# Patient Record
Sex: Female | Born: 1970 | Race: Black or African American | Hispanic: No | Marital: Married | Smoking: Never smoker
Health system: Southern US, Community
[De-identification: ages and names within clinical notes are randomized; demographics above are authoritative.]

## PROBLEM LIST (undated history)

## (undated) ENCOUNTER — Inpatient Hospital Stay (HOSPITAL_COMMUNITY): Payer: Self-pay

## (undated) DIAGNOSIS — D649 Anemia, unspecified: Secondary | ICD-10-CM

## (undated) DIAGNOSIS — I10 Essential (primary) hypertension: Secondary | ICD-10-CM

## (undated) DIAGNOSIS — E119 Type 2 diabetes mellitus without complications: Secondary | ICD-10-CM

## (undated) HISTORY — DX: Type 2 diabetes mellitus without complications: E11.9

## (undated) HISTORY — PX: BREAST BIOPSY: SHX20

## (undated) HISTORY — PX: DENTAL SURGERY: SHX609

---

## 2000-04-26 ENCOUNTER — Inpatient Hospital Stay (HOSPITAL_COMMUNITY): Admission: AD | Admit: 2000-04-26 | Discharge: 2000-04-26 | Payer: Self-pay | Admitting: *Deleted

## 2000-05-09 ENCOUNTER — Encounter: Admission: RE | Admit: 2000-05-09 | Discharge: 2000-05-09 | Payer: Self-pay | Admitting: Obstetrics

## 2000-05-10 ENCOUNTER — Ambulatory Visit (HOSPITAL_COMMUNITY): Admission: RE | Admit: 2000-05-10 | Discharge: 2000-05-10 | Payer: Self-pay | Admitting: Obstetrics

## 2000-06-06 ENCOUNTER — Encounter: Admission: RE | Admit: 2000-06-06 | Discharge: 2000-06-06 | Payer: Self-pay | Admitting: Obstetrics

## 2000-06-19 ENCOUNTER — Ambulatory Visit (HOSPITAL_COMMUNITY): Admission: RE | Admit: 2000-06-19 | Discharge: 2000-06-19 | Payer: Self-pay | Admitting: Obstetrics

## 2000-06-20 ENCOUNTER — Encounter: Admission: RE | Admit: 2000-06-20 | Discharge: 2000-06-20 | Payer: Self-pay | Admitting: Obstetrics

## 2000-06-24 ENCOUNTER — Inpatient Hospital Stay (HOSPITAL_COMMUNITY): Admission: AD | Admit: 2000-06-24 | Discharge: 2000-06-24 | Payer: Self-pay | Admitting: Obstetrics

## 2000-07-04 ENCOUNTER — Encounter: Admission: RE | Admit: 2000-07-04 | Discharge: 2000-07-04 | Payer: Self-pay | Admitting: Obstetrics

## 2000-07-17 ENCOUNTER — Encounter: Admission: RE | Admit: 2000-07-17 | Discharge: 2000-07-17 | Payer: Self-pay | Admitting: Obstetrics & Gynecology

## 2000-07-22 ENCOUNTER — Inpatient Hospital Stay (HOSPITAL_COMMUNITY): Admission: AD | Admit: 2000-07-22 | Discharge: 2000-07-22 | Payer: Self-pay | Admitting: Obstetrics

## 2000-07-31 ENCOUNTER — Encounter: Admission: RE | Admit: 2000-07-31 | Discharge: 2000-07-31 | Payer: Self-pay | Admitting: Obstetrics & Gynecology

## 2000-08-01 ENCOUNTER — Ambulatory Visit (HOSPITAL_COMMUNITY): Admission: RE | Admit: 2000-08-01 | Discharge: 2000-08-01 | Payer: Self-pay | Admitting: Obstetrics

## 2000-08-09 ENCOUNTER — Observation Stay (HOSPITAL_COMMUNITY): Admission: RE | Admit: 2000-08-09 | Discharge: 2000-08-10 | Payer: Self-pay

## 2000-08-14 ENCOUNTER — Encounter (HOSPITAL_COMMUNITY): Admission: RE | Admit: 2000-08-14 | Discharge: 2000-11-12 | Payer: Self-pay | Admitting: Obstetrics & Gynecology

## 2000-08-14 ENCOUNTER — Encounter: Admission: RE | Admit: 2000-08-14 | Discharge: 2000-08-14 | Payer: Self-pay | Admitting: Obstetrics & Gynecology

## 2000-08-16 ENCOUNTER — Observation Stay (HOSPITAL_COMMUNITY): Admission: AD | Admit: 2000-08-16 | Discharge: 2000-08-17 | Payer: Self-pay | Admitting: *Deleted

## 2000-08-19 ENCOUNTER — Inpatient Hospital Stay (HOSPITAL_COMMUNITY): Admission: AD | Admit: 2000-08-19 | Discharge: 2000-08-19 | Payer: Self-pay | Admitting: Obstetrics & Gynecology

## 2000-08-21 ENCOUNTER — Inpatient Hospital Stay (HOSPITAL_COMMUNITY): Admission: AD | Admit: 2000-08-21 | Discharge: 2000-08-21 | Payer: Self-pay | Admitting: Obstetrics

## 2000-08-21 ENCOUNTER — Encounter: Admission: RE | Admit: 2000-08-21 | Discharge: 2000-08-21 | Payer: Self-pay | Admitting: Obstetrics & Gynecology

## 2000-08-26 ENCOUNTER — Encounter: Admission: RE | Admit: 2000-08-26 | Discharge: 2000-11-24 | Payer: Self-pay | Admitting: Obstetrics & Gynecology

## 2000-08-28 ENCOUNTER — Encounter: Admission: RE | Admit: 2000-08-28 | Discharge: 2000-08-28 | Payer: Self-pay | Admitting: Obstetrics & Gynecology

## 2000-09-11 ENCOUNTER — Encounter: Admission: RE | Admit: 2000-09-11 | Discharge: 2000-09-11 | Payer: Self-pay | Admitting: Obstetrics & Gynecology

## 2000-09-18 ENCOUNTER — Encounter: Admission: RE | Admit: 2000-09-18 | Discharge: 2000-09-18 | Payer: Self-pay | Admitting: Obstetrics & Gynecology

## 2000-09-25 ENCOUNTER — Encounter: Admission: RE | Admit: 2000-09-25 | Discharge: 2000-09-25 | Payer: Self-pay | Admitting: Obstetrics & Gynecology

## 2000-10-02 ENCOUNTER — Encounter: Admission: RE | Admit: 2000-10-02 | Discharge: 2000-10-02 | Payer: Self-pay | Admitting: Obstetrics & Gynecology

## 2000-10-09 ENCOUNTER — Inpatient Hospital Stay (HOSPITAL_COMMUNITY): Admission: AD | Admit: 2000-10-09 | Discharge: 2000-10-09 | Payer: Self-pay | Admitting: Obstetrics

## 2000-10-09 ENCOUNTER — Encounter: Admission: RE | Admit: 2000-10-09 | Discharge: 2000-10-09 | Payer: Self-pay | Admitting: Obstetrics & Gynecology

## 2000-10-16 ENCOUNTER — Encounter: Admission: RE | Admit: 2000-10-16 | Discharge: 2000-10-16 | Payer: Self-pay | Admitting: Obstetrics & Gynecology

## 2000-10-23 ENCOUNTER — Encounter: Admission: RE | Admit: 2000-10-23 | Discharge: 2000-10-23 | Payer: Self-pay | Admitting: Obstetrics & Gynecology

## 2000-10-30 ENCOUNTER — Encounter: Admission: RE | Admit: 2000-10-30 | Discharge: 2000-10-30 | Payer: Self-pay | Admitting: Obstetrics & Gynecology

## 2000-11-01 ENCOUNTER — Encounter: Payer: Self-pay | Admitting: *Deleted

## 2000-11-06 ENCOUNTER — Encounter: Admission: RE | Admit: 2000-11-06 | Discharge: 2000-11-06 | Payer: Self-pay | Admitting: Obstetrics & Gynecology

## 2000-11-13 ENCOUNTER — Encounter (INDEPENDENT_AMBULATORY_CARE_PROVIDER_SITE_OTHER): Payer: Self-pay | Admitting: Specialist

## 2000-11-13 ENCOUNTER — Inpatient Hospital Stay (HOSPITAL_COMMUNITY): Admission: AD | Admit: 2000-11-13 | Discharge: 2000-11-15 | Payer: Self-pay | Admitting: Obstetrics

## 2001-01-14 ENCOUNTER — Inpatient Hospital Stay (HOSPITAL_COMMUNITY): Admission: AD | Admit: 2001-01-14 | Discharge: 2001-01-14 | Payer: Self-pay | Admitting: *Deleted

## 2001-01-16 ENCOUNTER — Encounter: Admission: RE | Admit: 2001-01-16 | Discharge: 2001-01-16 | Payer: Self-pay | Admitting: Internal Medicine

## 2001-11-03 ENCOUNTER — Other Ambulatory Visit: Admission: RE | Admit: 2001-11-03 | Discharge: 2001-11-03 | Payer: Self-pay | Admitting: Family Medicine

## 2001-11-11 ENCOUNTER — Encounter: Admission: RE | Admit: 2001-11-11 | Discharge: 2001-11-11 | Payer: Self-pay | Admitting: Family Medicine

## 2001-11-21 ENCOUNTER — Ambulatory Visit (HOSPITAL_BASED_OUTPATIENT_CLINIC_OR_DEPARTMENT_OTHER): Admission: RE | Admit: 2001-11-21 | Discharge: 2001-11-21 | Payer: Self-pay | Admitting: Otolaryngology

## 2001-11-21 ENCOUNTER — Encounter (INDEPENDENT_AMBULATORY_CARE_PROVIDER_SITE_OTHER): Payer: Self-pay | Admitting: *Deleted

## 2002-03-20 ENCOUNTER — Emergency Department (HOSPITAL_COMMUNITY): Admission: EM | Admit: 2002-03-20 | Discharge: 2002-03-20 | Payer: Self-pay | Admitting: Emergency Medicine

## 2002-03-20 ENCOUNTER — Encounter: Payer: Self-pay | Admitting: Emergency Medicine

## 2002-08-10 ENCOUNTER — Other Ambulatory Visit: Admission: RE | Admit: 2002-08-10 | Discharge: 2002-08-10 | Payer: Self-pay | Admitting: Family Medicine

## 2004-03-17 ENCOUNTER — Encounter: Admission: RE | Admit: 2004-03-17 | Discharge: 2004-03-17 | Payer: Self-pay | Admitting: Family Medicine

## 2004-03-23 ENCOUNTER — Ambulatory Visit (HOSPITAL_COMMUNITY): Admission: RE | Admit: 2004-03-23 | Discharge: 2004-03-23 | Payer: Self-pay | Admitting: Family Medicine

## 2005-06-27 ENCOUNTER — Ambulatory Visit: Payer: Self-pay | Admitting: *Deleted

## 2005-07-02 ENCOUNTER — Ambulatory Visit: Payer: Self-pay | Admitting: Obstetrics and Gynecology

## 2005-07-18 ENCOUNTER — Ambulatory Visit: Payer: Self-pay | Admitting: Obstetrics & Gynecology

## 2005-07-18 ENCOUNTER — Ambulatory Visit (HOSPITAL_COMMUNITY): Admission: RE | Admit: 2005-07-18 | Discharge: 2005-07-18 | Payer: Self-pay | Admitting: Obstetrics and Gynecology

## 2005-08-16 ENCOUNTER — Ambulatory Visit: Payer: Self-pay | Admitting: *Deleted

## 2005-08-30 ENCOUNTER — Ambulatory Visit (HOSPITAL_COMMUNITY): Admission: RE | Admit: 2005-08-30 | Discharge: 2005-08-30 | Payer: Self-pay | Admitting: Obstetrics and Gynecology

## 2005-09-05 ENCOUNTER — Ambulatory Visit: Payer: Self-pay | Admitting: Obstetrics & Gynecology

## 2005-09-19 ENCOUNTER — Ambulatory Visit: Payer: Self-pay | Admitting: *Deleted

## 2005-09-26 ENCOUNTER — Ambulatory Visit: Payer: Self-pay | Admitting: *Deleted

## 2005-10-03 ENCOUNTER — Ambulatory Visit: Payer: Self-pay | Admitting: Obstetrics & Gynecology

## 2005-10-17 ENCOUNTER — Ambulatory Visit (HOSPITAL_COMMUNITY): Admission: RE | Admit: 2005-10-17 | Discharge: 2005-10-17 | Payer: Self-pay | Admitting: Obstetrics & Gynecology

## 2005-10-17 ENCOUNTER — Ambulatory Visit: Payer: Self-pay | Admitting: Obstetrics & Gynecology

## 2005-10-25 ENCOUNTER — Ambulatory Visit: Payer: Self-pay | Admitting: Family Medicine

## 2005-10-31 ENCOUNTER — Ambulatory Visit: Payer: Self-pay | Admitting: *Deleted

## 2005-11-07 ENCOUNTER — Ambulatory Visit: Payer: Self-pay | Admitting: Obstetrics & Gynecology

## 2005-11-07 ENCOUNTER — Ambulatory Visit (HOSPITAL_COMMUNITY): Admission: RE | Admit: 2005-11-07 | Discharge: 2005-11-07 | Payer: Self-pay | Admitting: *Deleted

## 2005-11-15 ENCOUNTER — Ambulatory Visit: Payer: Self-pay | Admitting: Family Medicine

## 2005-11-28 ENCOUNTER — Ambulatory Visit: Payer: Self-pay | Admitting: Obstetrics & Gynecology

## 2005-12-04 ENCOUNTER — Ambulatory Visit (HOSPITAL_COMMUNITY): Admission: RE | Admit: 2005-12-04 | Discharge: 2005-12-04 | Payer: Self-pay | Admitting: *Deleted

## 2005-12-05 ENCOUNTER — Ambulatory Visit: Payer: Self-pay | Admitting: *Deleted

## 2005-12-12 ENCOUNTER — Ambulatory Visit: Payer: Self-pay | Admitting: Family Medicine

## 2005-12-14 ENCOUNTER — Ambulatory Visit: Payer: Self-pay | Admitting: *Deleted

## 2005-12-19 ENCOUNTER — Ambulatory Visit: Payer: Self-pay | Admitting: *Deleted

## 2005-12-19 ENCOUNTER — Ambulatory Visit: Payer: Self-pay | Admitting: Obstetrics & Gynecology

## 2005-12-21 ENCOUNTER — Ambulatory Visit: Payer: Self-pay | Admitting: *Deleted

## 2005-12-26 ENCOUNTER — Ambulatory Visit: Payer: Self-pay | Admitting: *Deleted

## 2005-12-28 ENCOUNTER — Ambulatory Visit: Payer: Self-pay | Admitting: *Deleted

## 2006-01-02 ENCOUNTER — Ambulatory Visit: Payer: Self-pay | Admitting: Obstetrics & Gynecology

## 2006-01-04 ENCOUNTER — Ambulatory Visit: Payer: Self-pay | Admitting: *Deleted

## 2006-01-09 ENCOUNTER — Ambulatory Visit: Payer: Self-pay | Admitting: Family Medicine

## 2006-01-11 ENCOUNTER — Ambulatory Visit: Payer: Self-pay | Admitting: *Deleted

## 2006-01-15 ENCOUNTER — Ambulatory Visit (HOSPITAL_COMMUNITY): Admission: RE | Admit: 2006-01-15 | Discharge: 2006-01-15 | Payer: Self-pay | Admitting: *Deleted

## 2006-01-16 ENCOUNTER — Inpatient Hospital Stay (HOSPITAL_COMMUNITY): Admission: AD | Admit: 2006-01-16 | Discharge: 2006-01-19 | Payer: Self-pay | Admitting: Obstetrics and Gynecology

## 2006-01-16 ENCOUNTER — Encounter (INDEPENDENT_AMBULATORY_CARE_PROVIDER_SITE_OTHER): Payer: Self-pay | Admitting: *Deleted

## 2006-01-16 ENCOUNTER — Ambulatory Visit: Payer: Self-pay | Admitting: Obstetrics and Gynecology

## 2006-02-02 ENCOUNTER — Inpatient Hospital Stay (HOSPITAL_COMMUNITY): Admission: AD | Admit: 2006-02-02 | Discharge: 2006-02-02 | Payer: Self-pay | Admitting: Obstetrics and Gynecology

## 2008-01-14 ENCOUNTER — Ambulatory Visit: Payer: Self-pay | Admitting: Internal Medicine

## 2008-01-14 ENCOUNTER — Ambulatory Visit: Payer: Self-pay | Admitting: *Deleted

## 2008-01-14 LAB — CONVERTED CEMR LAB
ALT: 29 units/L (ref 0–35)
AST: 23 units/L (ref 0–37)
Albumin: 4.6 g/dL (ref 3.5–5.2)
Alkaline Phosphatase: 69 units/L (ref 39–117)
BUN: 6 mg/dL (ref 6–23)
Basophils Absolute: 0 10*3/uL (ref 0.0–0.1)
Basophils Relative: 0 % (ref 0–1)
CO2: 24 meq/L (ref 19–32)
Calcium: 9 mg/dL (ref 8.4–10.5)
Chloride: 102 meq/L (ref 96–112)
Cholesterol: 140 mg/dL (ref 0–200)
Creatinine, Ser: 0.6 mg/dL (ref 0.40–1.20)
Eosinophils Absolute: 0.1 10*3/uL (ref 0.0–0.7)
Eosinophils Relative: 1 % (ref 0–5)
Glucose, Bld: 126 mg/dL — ABNORMAL HIGH (ref 70–99)
HCT: 40.8 % (ref 36.0–46.0)
HDL: 39 mg/dL — ABNORMAL LOW (ref >39)
Hemoglobin: 12.7 g/dL (ref 12.0–15.0)
LDL Cholesterol: 74 mg/dL (ref 0–99)
Lymphocytes Relative: 22 % (ref 12–46)
Lymphs Abs: 2.2 10*3/uL (ref 0.7–4.0)
MCHC: 31.1 g/dL (ref 30.0–36.0)
MCV: 80.3 fL (ref 78.0–100.0)
Monocytes Absolute: 0.7 10*3/uL (ref 0.1–1.0)
Monocytes Relative: 7 % (ref 3–12)
Neutro Abs: 6.8 10*3/uL (ref 1.7–7.7)
Neutrophils Relative %: 69 % (ref 43–77)
Platelets: 386 10*3/uL (ref 150–400)
Potassium: 3.6 meq/L (ref 3.5–5.3)
RBC: 5.08 M/uL (ref 3.87–5.11)
RDW: 13.7 % (ref 11.5–15.5)
Sodium: 139 meq/L (ref 135–145)
TSH: 0.782 microintl units/mL (ref 0.350–5.50)
Testosterone: 72.42 ng/dL — ABNORMAL HIGH (ref 10–70)
Total Bilirubin: 0.4 mg/dL (ref 0.3–1.2)
Total CHOL/HDL Ratio: 3.6
Total Protein: 7.7 g/dL (ref 6.0–8.3)
Triglycerides: 135 mg/dL (ref <150)
VLDL: 27 mg/dL (ref 0–40)
WBC: 9.8 10*3/uL (ref 4.0–10.5)

## 2008-01-22 ENCOUNTER — Ambulatory Visit (HOSPITAL_COMMUNITY): Admission: RE | Admit: 2008-01-22 | Discharge: 2008-01-22 | Payer: Self-pay | Admitting: Family Medicine

## 2008-03-02 ENCOUNTER — Encounter: Payer: Self-pay | Admitting: Family Medicine

## 2008-03-02 ENCOUNTER — Ambulatory Visit: Payer: Self-pay | Admitting: Family Medicine

## 2009-03-06 ENCOUNTER — Inpatient Hospital Stay (HOSPITAL_COMMUNITY): Admission: AD | Admit: 2009-03-06 | Discharge: 2009-03-06 | Payer: Self-pay | Admitting: Obstetrics & Gynecology

## 2010-01-19 ENCOUNTER — Inpatient Hospital Stay (HOSPITAL_COMMUNITY): Admission: AD | Admit: 2010-01-19 | Discharge: 2010-01-19 | Payer: Self-pay | Admitting: Family Medicine

## 2010-01-23 ENCOUNTER — Ambulatory Visit: Payer: Self-pay | Admitting: Obstetrics & Gynecology

## 2010-01-23 ENCOUNTER — Encounter: Payer: Self-pay | Admitting: Obstetrics & Gynecology

## 2010-01-27 ENCOUNTER — Ambulatory Visit (HOSPITAL_COMMUNITY): Admission: RE | Admit: 2010-01-27 | Discharge: 2010-01-27 | Payer: Self-pay | Admitting: Obstetrics & Gynecology

## 2010-02-01 ENCOUNTER — Ambulatory Visit (HOSPITAL_COMMUNITY): Admission: RE | Admit: 2010-02-01 | Discharge: 2010-02-01 | Payer: Self-pay | Admitting: Obstetrics & Gynecology

## 2010-02-06 ENCOUNTER — Ambulatory Visit: Payer: Self-pay | Admitting: Obstetrics & Gynecology

## 2010-02-06 ENCOUNTER — Encounter: Admission: RE | Admit: 2010-02-06 | Discharge: 2010-05-07 | Payer: Self-pay | Admitting: Obstetrics & Gynecology

## 2010-02-06 LAB — CONVERTED CEMR LAB
ALT: 9 units/L (ref 0–35)
Alkaline Phosphatase: 53 units/L (ref 39–117)
CO2: 20 meq/L (ref 19–32)
Creatinine, Ser: 0.48 mg/dL (ref 0.40–1.20)
HCT: 35 % — ABNORMAL LOW (ref 36.0–46.0)
MCHC: 32.9 g/dL (ref 30.0–36.0)
MCV: 77.3 fL — ABNORMAL LOW (ref 78.0–100.0)
Platelets: 343 10*3/uL (ref 150–400)
TSH: 0.262 microintl units/mL — ABNORMAL LOW (ref 0.350–4.500)
Total Bilirubin: 0.2 mg/dL — ABNORMAL LOW (ref 0.3–1.2)
Uric Acid, Serum: 3 mg/dL (ref 2.4–7.0)
WBC: 18.4 10*3/uL — ABNORMAL HIGH (ref 4.0–10.5)

## 2010-02-07 ENCOUNTER — Ambulatory Visit: Payer: Self-pay | Admitting: Obstetrics & Gynecology

## 2010-02-08 ENCOUNTER — Encounter: Payer: Self-pay | Admitting: Obstetrics & Gynecology

## 2010-02-08 LAB — CONVERTED CEMR LAB
Creatinine 24 HR UR: 1603 mg/24hr (ref 700–1800)
Creatinine, Urine: 87.8 mg/dL

## 2010-02-13 ENCOUNTER — Ambulatory Visit: Payer: Self-pay | Admitting: Obstetrics and Gynecology

## 2010-02-13 ENCOUNTER — Encounter: Payer: Self-pay | Admitting: Family

## 2010-02-13 LAB — CONVERTED CEMR LAB: Free T4: 1.07 ng/dL (ref 0.80–1.80)

## 2010-03-02 ENCOUNTER — Ambulatory Visit: Payer: Self-pay | Admitting: Obstetrics & Gynecology

## 2010-03-08 ENCOUNTER — Ambulatory Visit (HOSPITAL_COMMUNITY): Admission: RE | Admit: 2010-03-08 | Discharge: 2010-03-08 | Payer: Self-pay | Admitting: Obstetrics & Gynecology

## 2010-03-13 ENCOUNTER — Ambulatory Visit: Payer: Self-pay | Admitting: Obstetrics & Gynecology

## 2010-03-20 ENCOUNTER — Ambulatory Visit: Payer: Self-pay | Admitting: Obstetrics and Gynecology

## 2010-04-03 ENCOUNTER — Ambulatory Visit: Payer: Self-pay | Admitting: Obstetrics & Gynecology

## 2010-04-20 ENCOUNTER — Ambulatory Visit: Payer: Self-pay | Admitting: Obstetrics & Gynecology

## 2010-05-01 ENCOUNTER — Ambulatory Visit: Payer: Self-pay | Admitting: Obstetrics & Gynecology

## 2010-05-05 ENCOUNTER — Ambulatory Visit (HOSPITAL_COMMUNITY): Admission: RE | Admit: 2010-05-05 | Discharge: 2010-05-05 | Payer: Self-pay | Admitting: Obstetrics & Gynecology

## 2010-05-15 ENCOUNTER — Encounter: Admission: RE | Admit: 2010-05-15 | Discharge: 2010-07-10 | Payer: Self-pay | Admitting: Obstetrics & Gynecology

## 2010-05-15 ENCOUNTER — Ambulatory Visit: Payer: Self-pay | Admitting: Obstetrics & Gynecology

## 2010-05-15 ENCOUNTER — Encounter: Payer: Self-pay | Admitting: Family

## 2010-05-15 LAB — CONVERTED CEMR LAB
HCT: 34.1 % — ABNORMAL LOW (ref 36.0–46.0)
Hemoglobin: 10.9 g/dL — ABNORMAL LOW (ref 12.0–15.0)
MCHC: 32 g/dL (ref 30.0–36.0)
RDW: 14.3 % (ref 11.5–15.5)

## 2010-05-22 ENCOUNTER — Ambulatory Visit: Payer: Self-pay | Admitting: Obstetrics & Gynecology

## 2010-05-30 ENCOUNTER — Ambulatory Visit (HOSPITAL_COMMUNITY): Admission: RE | Admit: 2010-05-30 | Discharge: 2010-05-30 | Payer: Self-pay | Admitting: Family Medicine

## 2010-06-05 ENCOUNTER — Ambulatory Visit: Payer: Self-pay | Admitting: Obstetrics & Gynecology

## 2010-06-19 ENCOUNTER — Ambulatory Visit: Payer: Self-pay | Admitting: Obstetrics & Gynecology

## 2010-06-19 ENCOUNTER — Ambulatory Visit (HOSPITAL_COMMUNITY): Admission: RE | Admit: 2010-06-19 | Discharge: 2010-06-19 | Payer: Self-pay | Admitting: Obstetrics & Gynecology

## 2010-06-19 ENCOUNTER — Other Ambulatory Visit: Payer: Self-pay | Admitting: Obstetrics & Gynecology

## 2010-07-03 ENCOUNTER — Ambulatory Visit: Payer: Self-pay | Admitting: Family Medicine

## 2010-07-06 ENCOUNTER — Ambulatory Visit: Payer: Self-pay | Admitting: Obstetrics & Gynecology

## 2010-07-10 ENCOUNTER — Ambulatory Visit: Payer: Self-pay | Admitting: Obstetrics & Gynecology

## 2010-07-10 ENCOUNTER — Encounter: Payer: Self-pay | Admitting: Physician Assistant

## 2010-07-11 ENCOUNTER — Encounter: Payer: Self-pay | Admitting: Physician Assistant

## 2010-07-13 ENCOUNTER — Ambulatory Visit: Payer: Self-pay | Admitting: Obstetrics and Gynecology

## 2010-07-17 ENCOUNTER — Ambulatory Visit (HOSPITAL_COMMUNITY): Admission: RE | Admit: 2010-07-17 | Discharge: 2010-07-17 | Payer: Self-pay | Admitting: Obstetrics & Gynecology

## 2010-07-17 ENCOUNTER — Ambulatory Visit: Payer: Self-pay | Admitting: Obstetrics & Gynecology

## 2010-07-20 ENCOUNTER — Ambulatory Visit: Payer: Self-pay | Admitting: Obstetrics and Gynecology

## 2010-07-24 ENCOUNTER — Ambulatory Visit: Payer: Self-pay | Admitting: Obstetrics & Gynecology

## 2010-07-27 ENCOUNTER — Ambulatory Visit: Payer: Self-pay | Admitting: Obstetrics & Gynecology

## 2010-07-31 ENCOUNTER — Inpatient Hospital Stay (HOSPITAL_COMMUNITY): Admission: RE | Admit: 2010-07-31 | Discharge: 2010-08-04 | Payer: Self-pay | Admitting: Obstetrics & Gynecology

## 2010-07-31 ENCOUNTER — Ambulatory Visit: Payer: Self-pay | Admitting: Obstetrics and Gynecology

## 2010-08-01 ENCOUNTER — Encounter: Payer: Self-pay | Admitting: Obstetrics & Gynecology

## 2010-08-10 ENCOUNTER — Ambulatory Visit: Payer: Self-pay | Admitting: Advanced Practice Midwife

## 2010-08-10 ENCOUNTER — Inpatient Hospital Stay (HOSPITAL_COMMUNITY): Admission: AD | Admit: 2010-08-10 | Discharge: 2010-08-10 | Payer: Self-pay | Admitting: Obstetrics & Gynecology

## 2010-08-27 IMAGING — US US OB FOLLOW-UP
1 series · 17 of 17 positions shown · non-contrast
Comparison: none

OBSTETRICAL ULTRASOUND:
 This ultrasound was performed in The [HOSPITAL], and the [facility] report will be stored to [REDACTED] PACS.  This report is also available in [HOSPITAL]?s accessANYware.

[Series 1: us ob follow-up · 17 acquisitions, 17 frames shown]
[im 1/17]
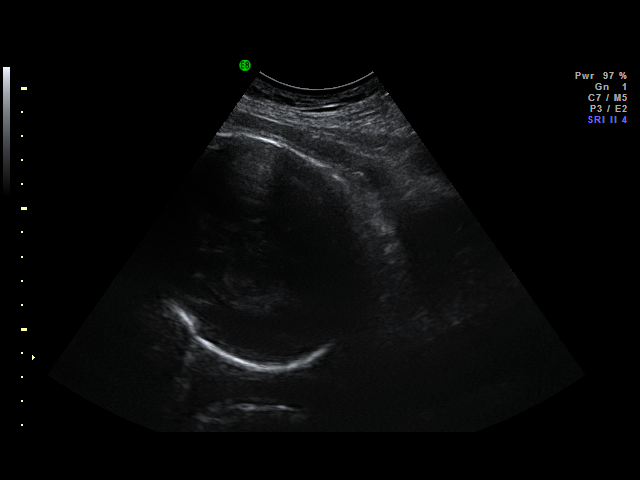
[im 2/17]
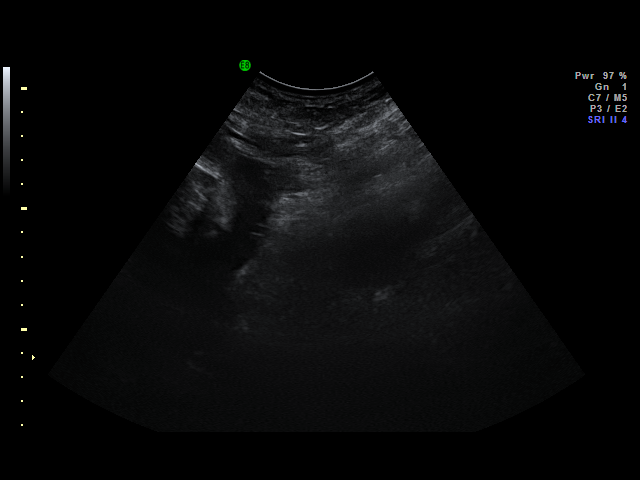
[im 3/17]
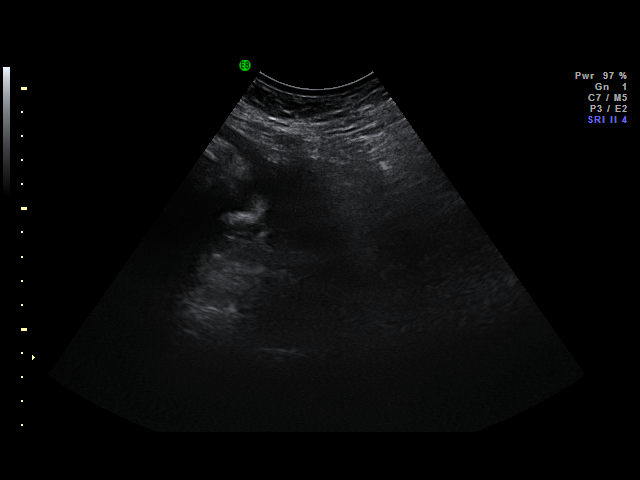
[im 4/17]
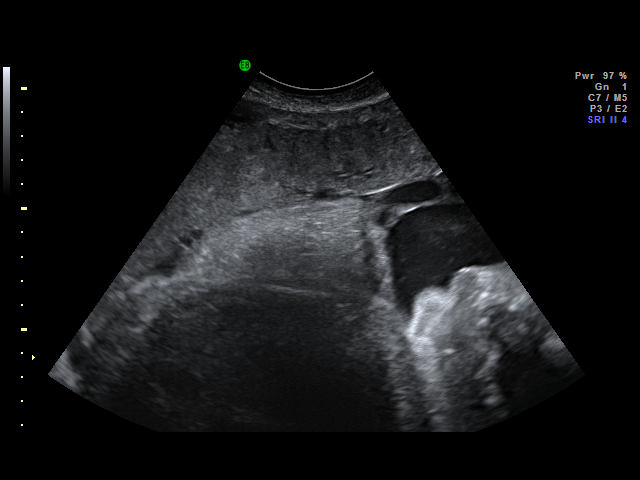
[im 5/17]
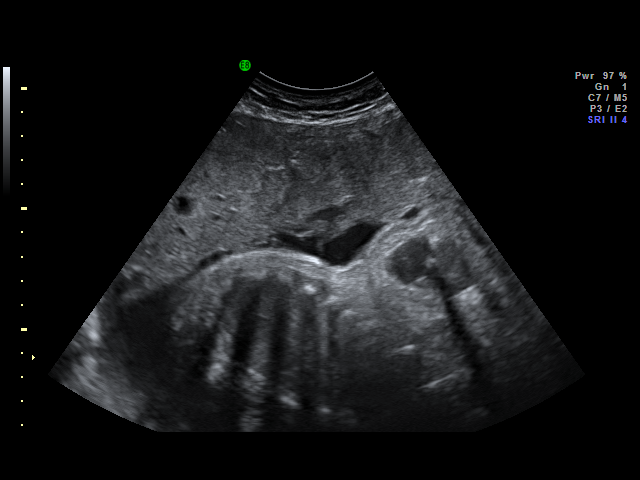
[im 6/17]
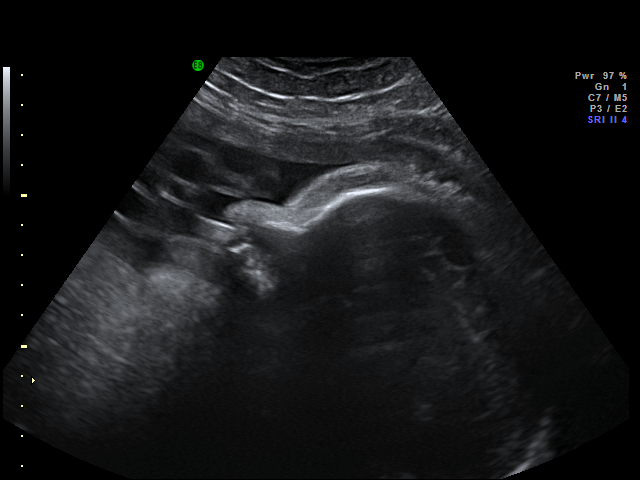
[im 7/17]
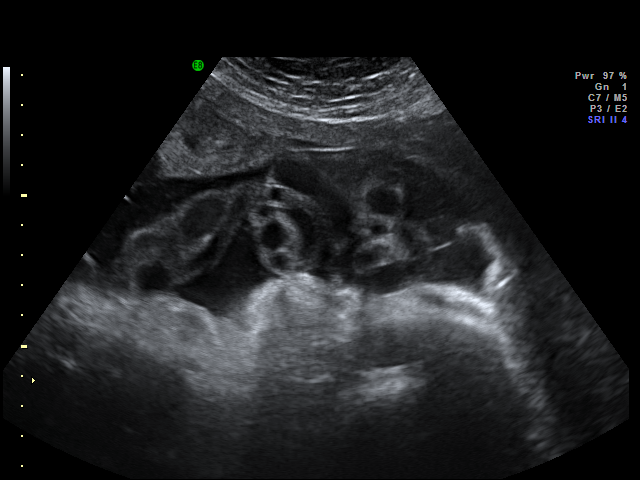
[im 8/17]
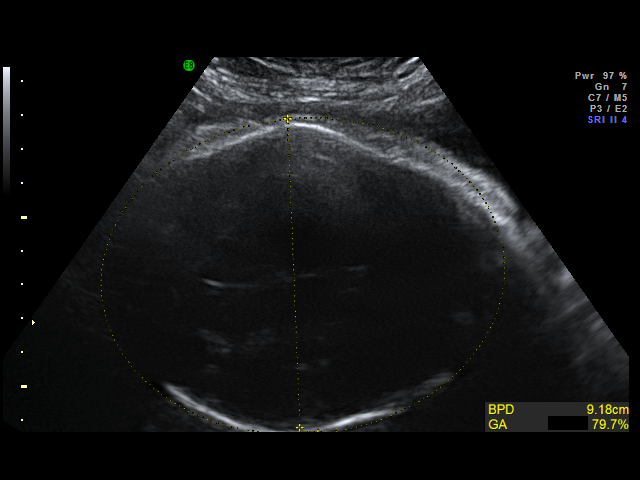
[im 9/17]
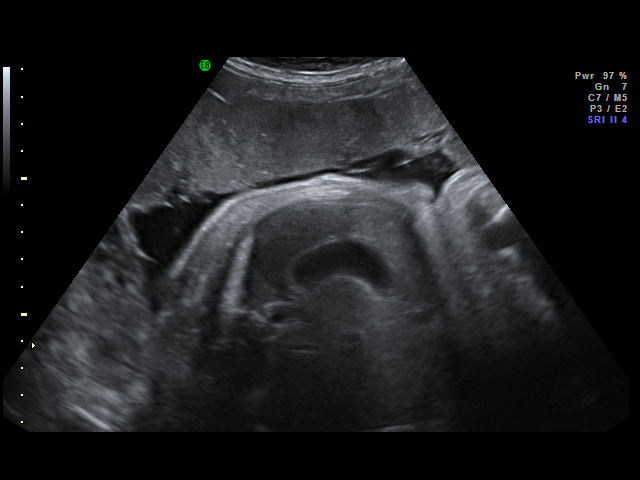
[im 10/17]
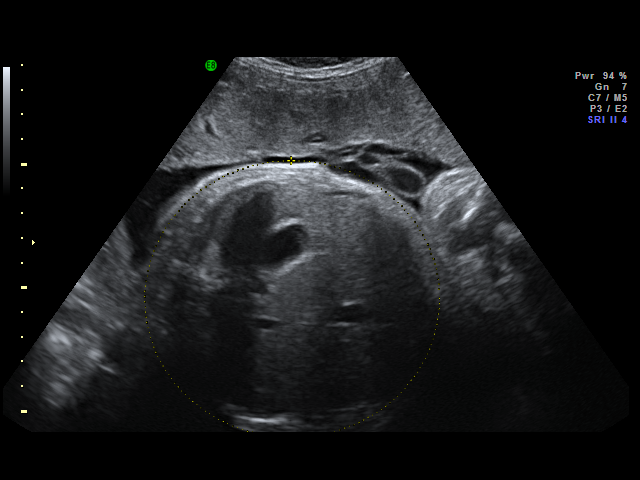
[im 11/17]
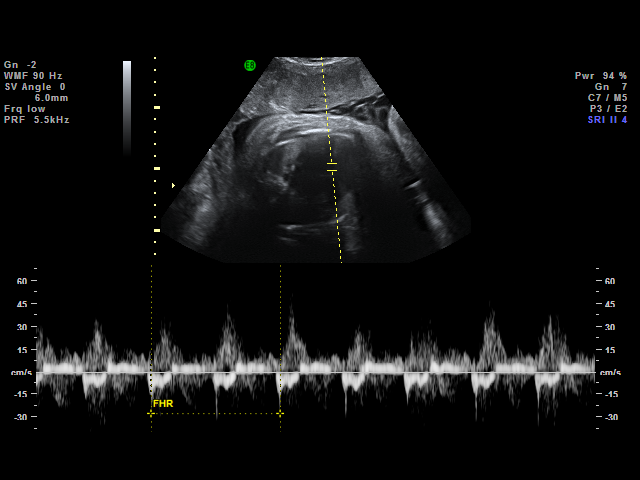
[im 12/17]
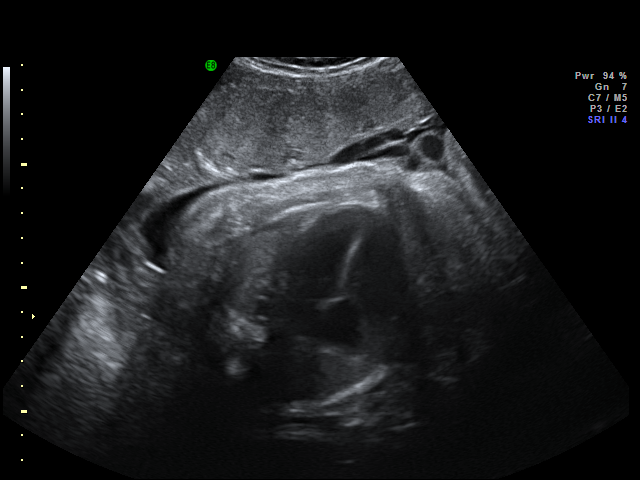
[im 13/17]
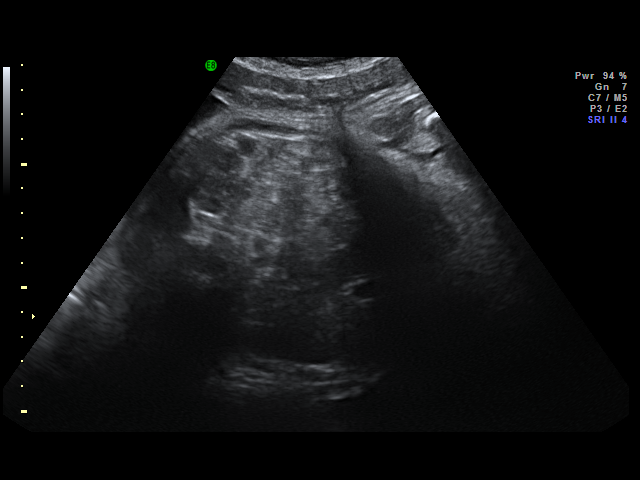
[im 14/17]
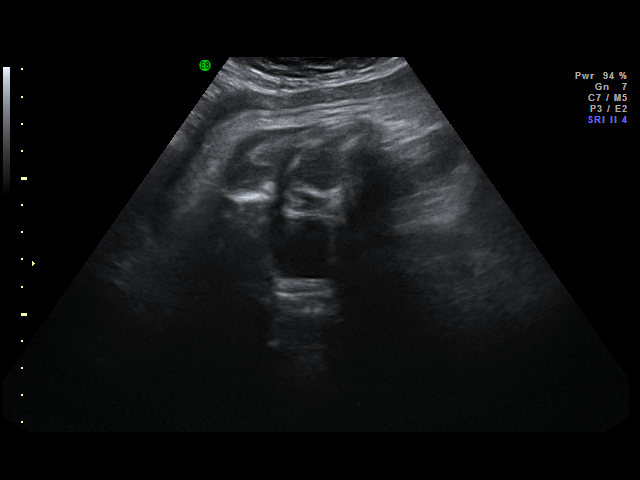
[im 15/17]
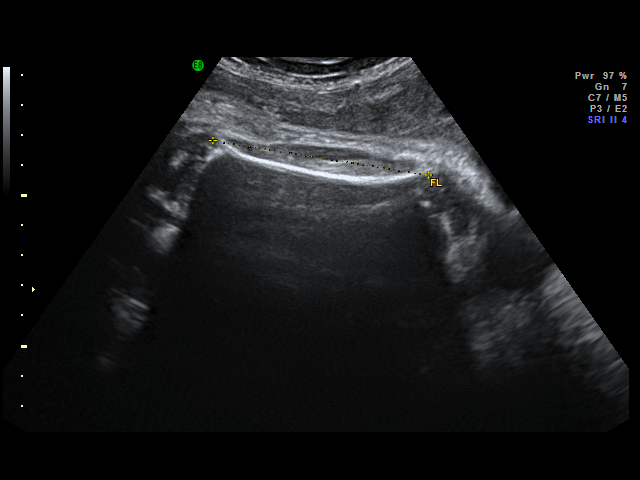
[im 16/17]
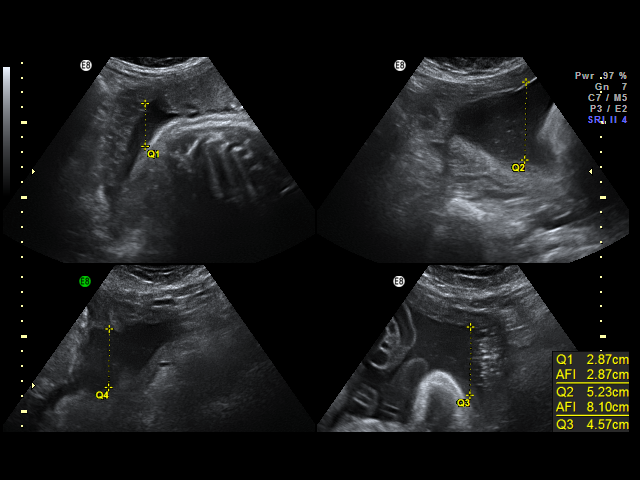
[im 17/17]
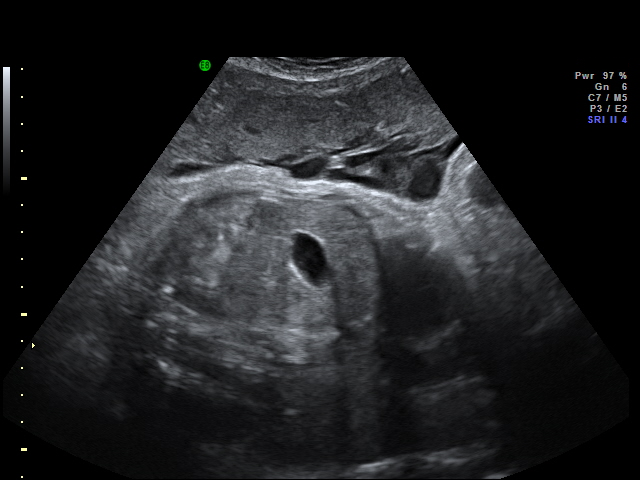

[17 of 17 positions shown; findings below may reference images not displayed]

IMPRESSION: [facility] has also been faxed to the ordering physician.

## 2010-09-11 ENCOUNTER — Ambulatory Visit: Payer: Self-pay | Admitting: Obstetrics & Gynecology

## 2010-09-13 ENCOUNTER — Ambulatory Visit: Payer: Self-pay | Admitting: Obstetrics and Gynecology

## 2010-09-13 ENCOUNTER — Encounter: Payer: Self-pay | Admitting: Obstetrics & Gynecology

## 2010-10-13 ENCOUNTER — Ambulatory Visit: Payer: Self-pay | Admitting: Family Medicine

## 2010-10-13 DIAGNOSIS — R7309 Other abnormal glucose: Secondary | ICD-10-CM | POA: Insufficient documentation

## 2010-12-28 NOTE — Assessment & Plan Note (Signed)
Summary: NP/Referred from [facility]   Vital Signs:  Patient profile:   40 year old female Weight:      204 pounds Temp:     97.8 degrees F oral Pulse rate:   60 / minute BP sitting:   134 / 94  (right arm) Cuff size:   large  Vitals Entered By: Jimmy Footman, CMA (October 13, 2010 3:47 PM) CC: new patient/gestational DM Is Patient Diabetic? Yes   Primary Provider:  Danise Mina la Cruz DO  CC:  new patient/gestational DM.  History of Present Illness: 40 yo female referred from OBGYN for Type 2 DM.  Pt delivered baby 08/01/2010.  She was dx with gestational diabetes during her previous 2 pregnancies.  3-hr GTT was abnormal.  Pt was started on Glyburide, but it made her feel weak and sweaty.  She stopped taking it.  Now she presents to clinic to get screened for diabetes.  No comlaints of polyuria, polydipsia, or polyphagia.  Does c/o headache intermittently, but she had HA before pregnancy.  Denies weakness or fatigue.  Has been counseled by diabetic educators at high risk clinic.  Preventive Screening-Counseling & Management  Alcohol-Tobacco     Smoking Status: never  Current Medications (verified): 1)  Prenatal/folic Acid  Tabs (Prenatal Vit-Fe Fumarate-Fa) .... Take One Tablet Daily  Allergies (verified): No Known Drug Allergies  Past History:  Past Medical History: G6-P5-0-1-5 GESTATIONAL DIABETES  Past Surgical History: C-SECTION 2007, 2011 L BREAST BIOPSY, normal  Family History: Grandparents - DM  Social History: Married.  Lives at home w/ husband and 4 children.  Enjoys line dancing.   Non-smoker, no alcohol use, no recreational drugsSmoking Status:  never  Review of Systems       per HPI  Physical Exam  General:  in no acute distress; alert,appropriate and cooperative throughout examination Head:  Normocephalic and atraumatic Eyes:  No corneal or conjunctival inflammation noted. EOMI. Perrla. Nose:  External nasal examination shows no deformity or  inflammation.  Mouth:  Oral mucosa and oropharynx without lesions or exudates.  Teeth in good repair. Neck:  No deformities, masses, or tenderness noted. Breasts:  No mass, nodules, thickening, tenderness, bulging, retraction, inflamation, nipple discharge or skin changes noted.   Lungs:  Normal respiratory effort, chest expands symmetrically. Lungs are clear to auscultation, no crackles or wheezes. Heart:  Normal rate and regular rhythm. S1 and S2 normal without gallop, murmur, click, rub or other extra sounds. Abdomen:  Bowel sounds positive,abdomen soft and non-tender. Genitalia:  Normal introitus for age, no external lesions, no vaginal discharge, mucosa pink and moist, no vaginal or cervical lesions, no vaginal atrophy, no friaility or hemorrhage, normal uterus size and position, no adnexal masses or tenderness   Detailed Genitourinary Exam    External Genitalia: Normal female external genitalia.       Urethra: No lesions or discharge with palpation. Normal urethral size and location, no lesions or discharge.      Vagina: Normal appearance, no discharge or lesions.  No evidence of cystocele, enterocele, or rectocele.      Cervix: Normal without masses or lesions.      Adnexa: Normal without masses or tenderness on palpation.     Impression & Recommendations:  Problem # 1:  IMPAIRED GLUCOSE TOLERANCE TEST (ICD-790.22) Assessment New Pt A1C and random BG were 6.4 and 94, respectively.  Encouraged pt to continue to modify diet and increase exercise for now.  Will follow-up in 3 months to re-check A1C.  If it is above 6.5, may consider starting Metformin 500mg  two times a day.  Pt seemed happy that she did not meet criteria for DM type 2, so hopefully she will be motivated to lose weight and eat healthy foods.  Will continue to monitor.  Other Orders: Glucose Cap-[facility] (78295) A1C-[facility] (62130)  Patient Instructions: 1)  It was great to meet you today. 2)  Please continue to avoid foods  high in carbs and starches- pasta, bread, potatos, rice, etc. 3)  Please increase physical activity as tolerated.  Both diet and exercise can decrease your risk of becoming diabetic. 4)  Please schedule a follow-up appointment in 3 months. 5)  I will recheck your A1C at that time. 6)  Thanks! Prescriptions: PRENATAL/FOLIC ACID  TABS (PRENATAL VIT-FE FUMARATE-FA) take one tablet daily  #30 x 0   Entered and Authorized by:   Ivy de Lawson Radar  MD   Signed by:   Barnabas Lister  MD on 10/13/2010   Method used:   Historical   RxID:   [id number]    Orders Added: 1)  Glucose Cap-[facility] [82948] 2)  A1C-[facility] [83036] 3)  [facility]- New Level 3 [99203]    Laboratory Results   Blood Tests   Date/Time Received: October 13, 2010 3:54 PM  Date/Time Reported: October 13, 2010 4:32 PM   HGBA1C: 6.4%   (Normal Range: Non-Diabetic - 3-6%   Control Diabetic - 6-8%)  Comments: ...........test performed by...........Marland KitchenTerese Door, CMA

## 2011-02-06 LAB — GLUCOSE, CAPILLARY: Glucose-Capillary: 94 mg/dL (ref 70–99)

## 2011-02-08 LAB — COMPREHENSIVE METABOLIC PANEL
ALT: 32 U/L (ref 0–35)
ALT: 17 U/L (ref 0–35)
AST: 22 U/L (ref 0–37)
AST: 21 U/L (ref 0–37)
Albumin: 3.1 g/dL — ABNORMAL LOW (ref 3.5–5.2)
Alkaline Phosphatase: 72 U/L (ref 39–117)
Alkaline Phosphatase: 94 U/L (ref 39–117)
BUN: 6 mg/dL (ref 6–23)
CO2: 28 mEq/L (ref 19–32)
CO2: 21 mEq/L (ref 19–32)
Calcium: 8.4 mg/dL (ref 8.4–10.5)
Chloride: 105 mEq/L (ref 96–112)
Creatinine, Ser: 0.53 mg/dL (ref 0.4–1.2)
GFR calc Af Amer: >60 mL/min (ref >60)
GFR calc Af Amer: >60 mL/min (ref >60)
GFR calc non Af Amer: >60 mL/min (ref >60)
GFR calc non Af Amer: >60 mL/min (ref >60)
Glucose, Bld: 112 mg/dL — ABNORMAL HIGH (ref 70–99)
Glucose, Bld: 80 mg/dL (ref 70–99)
Potassium: 3.8 mEq/L (ref 3.5–5.1)
Potassium: 4.2 mEq/L (ref 3.5–5.1)
Sodium: 139 mEq/L (ref 135–145)
Sodium: 136 mEq/L (ref 135–145)
Total Bilirubin: 0.2 mg/dL — ABNORMAL LOW (ref 0.3–1.2)
Total Protein: 6.2 g/dL (ref 6.0–8.3)

## 2011-02-08 LAB — POCT URINALYSIS DIPSTICK
Hgb urine dipstick: NEGATIVE
Hgb urine dipstick: NEGATIVE
Ketones, ur: NEGATIVE mg/dL
Nitrite: NEGATIVE
Nitrite: NEGATIVE
Nitrite: NEGATIVE
Protein, ur: NEGATIVE mg/dL
Protein, ur: NEGATIVE mg/dL
Protein, ur: NEGATIVE mg/dL
Specific Gravity, Urine: 1.02 (ref 1.005–1.030)
Urobilinogen, UA: 0.2 mg/dL (ref 0.0–1.0)
Urobilinogen, UA: 0.2 mg/dL (ref 0.0–1.0)
Urobilinogen, UA: 0.2 mg/dL (ref 0.0–1.0)
pH: 6 (ref 5.0–8.0)
pH: 6.5 (ref 5.0–8.0)
pH: 6.5 (ref 5.0–8.0)

## 2011-02-08 LAB — GLUCOSE, CAPILLARY
Glucose-Capillary: 103 mg/dL — ABNORMAL HIGH (ref 70–99)
Glucose-Capillary: 107 mg/dL — ABNORMAL HIGH (ref 70–99)
Glucose-Capillary: 116 mg/dL — ABNORMAL HIGH (ref 70–99)
Glucose-Capillary: 140 mg/dL — ABNORMAL HIGH (ref 70–99)
Glucose-Capillary: 144 mg/dL — ABNORMAL HIGH (ref 70–99)
Glucose-Capillary: 154 mg/dL — ABNORMAL HIGH (ref 70–99)
Glucose-Capillary: 72 mg/dL (ref 70–99)
Glucose-Capillary: 72 mg/dL (ref 70–99)
Glucose-Capillary: 75 mg/dL (ref 70–99)
Glucose-Capillary: 78 mg/dL (ref 70–99)
Glucose-Capillary: 98 mg/dL (ref 70–99)

## 2011-02-08 LAB — CBC
HCT: 36 % (ref 36.0–46.0)
Hemoglobin: 11 g/dL — ABNORMAL LOW (ref 12.0–15.0)
Hemoglobin: 11.8 g/dL — ABNORMAL LOW (ref 12.0–15.0)
MCH: 26.7 pg (ref 26.0–34.0)
MCH: 27.4 pg (ref 26.0–34.0)
MCHC: 33.1 g/dL (ref 30.0–36.0)
MCV: 82.4 fL (ref 78.0–100.0)
MCV: 82.8 fL (ref 78.0–100.0)
Platelets: 202 10*3/uL (ref 150–400)
RBC: 4.11 MIL/uL (ref 3.87–5.11)
RBC: 3.47 MIL/uL — ABNORMAL LOW (ref 3.87–5.11)
RBC: 4.45 MIL/uL (ref 3.87–5.11)
RDW: 16.9 % — ABNORMAL HIGH (ref 11.5–15.5)
WBC: 13.5 10*3/uL — ABNORMAL HIGH (ref 4.0–10.5)

## 2011-02-08 LAB — URIC ACID: Uric Acid, Serum: 4.7 mg/dL (ref 2.4–7.0)

## 2011-02-08 LAB — BASIC METABOLIC PANEL
BUN: 3 mg/dL — ABNORMAL LOW (ref 6–23)
CO2: 25 mEq/L (ref 19–32)
Calcium: 8.6 mg/dL (ref 8.4–10.5)
Chloride: 107 mEq/L (ref 96–112)
Creatinine, Ser: 0.54 mg/dL (ref 0.4–1.2)
GFR calc Af Amer: >60 mL/min (ref >60)

## 2011-02-08 LAB — URINALYSIS, ROUTINE W REFLEX MICROSCOPIC
Bilirubin Urine: NEGATIVE
Glucose, UA: NEGATIVE mg/dL
Ketones, ur: NEGATIVE mg/dL
Nitrite: NEGATIVE
Protein, ur: NEGATIVE mg/dL
Specific Gravity, Urine: 1.015 (ref 1.005–1.030)
Urobilinogen, UA: 0.2 mg/dL (ref 0.0–1.0)
pH: 6 (ref 5.0–8.0)

## 2011-02-08 LAB — TYPE AND SCREEN
ABO/RH(D): B POS
Antibody Screen: NEGATIVE

## 2011-02-08 LAB — PROTEIN / CREATININE RATIO, URINE: Protein Creatinine Ratio: 0.21 — ABNORMAL HIGH (ref 0.00–0.15)

## 2011-02-08 LAB — URINE MICROSCOPIC-ADD ON

## 2011-02-08 LAB — RPR: RPR Ser Ql: NONREACTIVE

## 2011-02-09 LAB — POCT URINALYSIS DIPSTICK
Nitrite: NEGATIVE
Protein, ur: NEGATIVE mg/dL
Specific Gravity, Urine: 1.01 (ref 1.005–1.030)
Urobilinogen, UA: 0.2 mg/dL (ref 0.0–1.0)

## 2011-02-10 LAB — POCT URINALYSIS DIP (DEVICE)
Glucose, UA: NEGATIVE mg/dL
Hgb urine dipstick: NEGATIVE
Nitrite: NEGATIVE
Urobilinogen, UA: 0.2 mg/dL (ref 0.0–1.0)

## 2011-02-11 LAB — POCT URINALYSIS DIP (DEVICE)
Glucose, UA: NEGATIVE mg/dL
Nitrite: NEGATIVE
Nitrite: NEGATIVE
Protein, ur: 30 mg/dL — AB
Protein, ur: NEGATIVE mg/dL
Urobilinogen, UA: 0.2 mg/dL (ref 0.0–1.0)
Urobilinogen, UA: 0.2 mg/dL (ref 0.0–1.0)
pH: 6 (ref 5.0–8.0)

## 2011-02-12 LAB — POCT URINALYSIS DIP (DEVICE)
Glucose, UA: NEGATIVE mg/dL
Ketones, ur: >=160 mg/dL — AB
Nitrite: NEGATIVE
Protein, ur: 30 mg/dL — AB

## 2011-02-13 LAB — POCT URINALYSIS DIP (DEVICE)
Bilirubin Urine: NEGATIVE
Bilirubin Urine: NEGATIVE
Glucose, UA: NEGATIVE mg/dL
Glucose, UA: NEGATIVE mg/dL
Glucose, UA: NEGATIVE mg/dL
Hgb urine dipstick: NEGATIVE
Hgb urine dipstick: NEGATIVE
Ketones, ur: NEGATIVE mg/dL
Nitrite: NEGATIVE
Nitrite: NEGATIVE
Specific Gravity, Urine: >=1.03 (ref 1.005–1.030)
Urobilinogen, UA: 0.2 mg/dL (ref 0.0–1.0)

## 2011-02-13 LAB — GLUCOSE, CAPILLARY: Glucose-Capillary: 162 mg/dL — ABNORMAL HIGH (ref 70–99)

## 2011-02-14 LAB — URINE CULTURE

## 2011-02-14 LAB — POCT URINALYSIS DIP (DEVICE)
Glucose, UA: NEGATIVE mg/dL
Hgb urine dipstick: NEGATIVE
Nitrite: NEGATIVE
Protein, ur: NEGATIVE mg/dL
Specific Gravity, Urine: 1.01 (ref 1.005–1.030)
Urobilinogen, UA: 0.2 mg/dL (ref 0.0–1.0)
Urobilinogen, UA: 0.2 mg/dL (ref 0.0–1.0)

## 2011-02-14 LAB — URINALYSIS, ROUTINE W REFLEX MICROSCOPIC
Hgb urine dipstick: NEGATIVE
Nitrite: NEGATIVE
Specific Gravity, Urine: 1.025 (ref 1.005–1.030)
Urobilinogen, UA: 0.2 mg/dL (ref 0.0–1.0)

## 2011-02-14 LAB — DIFFERENTIAL
Basophils Relative: 0 % (ref 0–1)
Lymphs Abs: 0.8 10*3/uL (ref 0.7–4.0)
Monocytes Relative: 8 % (ref 3–12)
Neutro Abs: 17.3 10*3/uL — ABNORMAL HIGH (ref 1.7–7.7)
Neutrophils Relative %: 88 % — ABNORMAL HIGH (ref 43–77)

## 2011-02-14 LAB — HEPATITIS B SURFACE ANTIGEN: Hepatitis B Surface Ag: NEGATIVE

## 2011-02-14 LAB — CBC
Platelets: 302 10*3/uL (ref 150–400)
RBC: 4.56 MIL/uL (ref 3.87–5.11)
WBC: 19.6 10*3/uL — ABNORMAL HIGH (ref 4.0–10.5)

## 2011-02-14 LAB — ABO/RH: ABO/RH(D): B POS

## 2011-02-14 LAB — WET PREP, GENITAL
Clue Cells Wet Prep HPF POC: NONE SEEN
Trich, Wet Prep: NONE SEEN
Yeast Wet Prep HPF POC: NONE SEEN

## 2011-02-14 LAB — HIV ANTIBODY (ROUTINE TESTING W REFLEX): HIV: NONREACTIVE

## 2011-02-14 LAB — SICKLE CELL SCREEN: Sickle Cell Screen: NEGATIVE

## 2011-02-14 LAB — RPR: RPR Ser Ql: NONREACTIVE

## 2011-02-16 LAB — POCT URINALYSIS DIP (DEVICE)
Bilirubin Urine: NEGATIVE
Ketones, ur: 40 mg/dL — AB
Nitrite: NEGATIVE
Nitrite: NEGATIVE
Protein, ur: 30 mg/dL — AB
Protein, ur: NEGATIVE mg/dL
Urobilinogen, UA: 0.2 mg/dL (ref 0.0–1.0)
pH: 6 (ref 5.0–8.0)

## 2011-03-07 LAB — CBC
HCT: 36.4 % (ref 36.0–46.0)
Hemoglobin: 11.7 g/dL — ABNORMAL LOW (ref 12.0–15.0)
MCHC: 32.3 g/dL (ref 30.0–36.0)
MCV: 80.5 fL (ref 78.0–100.0)
RBC: 4.52 MIL/uL (ref 3.87–5.11)

## 2011-03-07 LAB — GC/CHLAMYDIA PROBE AMP, GENITAL: GC Probe Amp, Genital: NEGATIVE

## 2011-04-13 NOTE — Op Note (Signed)
Lynn Moody, Lynn Moody               ACCOUNT NO.:  [id number]   MEDICAL RECORD NO.:  [id number]          PATIENT TYPE:  INP   LOCATION:  9373                          FACILITY:  [facility]   PHYSICIAN:  Phil D. Okey Dupre, M.D.     DATE OF BIRTH:  Sep 18, 1971   DATE OF PROCEDURE:  01/15/2006  DATE OF DISCHARGE:                                 OPERATIVE REPORT   PROCEDURE:  Low transverse cesarean section.   POSTOPERATIVE DIAGNOSIS:  Cephalopelvic disproportion, failed medical  induction of labor, preeclampsia.   POSTOPERATIVE DIAGNOSIS:  Cephalopelvic disproportion, failed medical  induction of labor, preeclampsia.   SURGEON:  Dr. Okey Dupre   FIRST ASSISTANT:  Dr. Wilburt Finlay   ANESTHESIA:  Epidural.   ESTIMATED BLOOD LOSS:  700 mL.   POSTOPERATIVE CONDITION:  Satisfactory.   DESCRIPTION OF PROCEDURE:  Under satisfactory epidural anesthesia, the  patient in dorsal supine position, the abdomen prepped and draped in the  usual sterile manner with Foley catheter in urinary bladder, the abdomen was  entered through a transverse Pfannenstiel incision situated 3 cm above the  symphysis pubis and extending for a total length of 16 cm. Abdomen was  entered by layers. On entering the peritoneal cavity visceral peritoneum and  the anterior surface of the uterus was opened transversely by sharp  dissection. The bladder pushed away the lower uterine segment, was entered  by sharp and blunt dissection and from an LOT presentation the baby was  delivered. Cord doubly clamped, divided. The baby handed to pediatrician.  Samples of blood taken from the cord for analysis. Placenta spontaneously  removed and the uterus explored. The uterus was then closed with a  continuous running locked 0 Vicryl on an atraumatic needle. Second  imbricating suture of the same material. Areas observed for bleeding. None  was noted. The pelvis was irrigated with normal saline and the fascia was  closed with continuous running 0  Vicryl on an atraumatic needle.  Subcutaneous bleeders were controlled with hot cautery. A subcutaneous  tissue was closed with 3-0 plain catgut suture. Skin edge approximated with  skin staples. Dry sterile dressing applied. The patient tolerated procedure  well and was transferred to recovery room in satisfactory condition with a  Foley catheter draining clear amber urine at the end the procedure. The baby  was 10 pounds 8 ounces and a female infant with Apgars of 4 and 7.           ______________________________  Javier Glazier. Okey Dupre, M.D.     PDR/MEDQ  D:  01/16/2006  T:  01/17/2006  Job:  [id number]

## 2011-04-13 NOTE — Op Note (Signed)
[facility]  Patient:    Lynn Moody, Lynn Moody Visit Number: [id number] MRN: [id number]          Service Type: DSU Location: [facility] Attending Physician:  Carlean Purl Dictated by:   Kristine Garbe Ezzard Standing, M.D. Proc. Date: 11/21/01 Admit Date:  11/21/2001   CC:         Geraldo Pitter, M.D.   Operative Report  PREOPERATIVE DIAGNOSIS:  Chronic tonsillitis.  POSTOPERATIVE DIAGNOSIS:  Chronic tonsillitis.  OPERATION:  Tonsillectomy.  SURGEON:  Kristine Garbe. Ezzard Standing, M.D.  ANESTHESIA:  General endotracheal.  COMPLICATIONS:  None.  BRIEF CLINICAL NOTE:  [name] is a 40 year old female, who has had chronic tonsil problems with frequent sore throats, as well as large cryptic tonsils. She is taken to the operating room at this time for tonsillectomy.  DESCRIPTION OF PROCEDURE:  After adequate endotracheal anesthesia, the patient received 10 mg of Decadron IV preoperatively, as well as 1 g Ancef IV preoperatively.  A mouthgag was used to expose the oropharynx.  The left and right tonsil were resected from tonsil fossa using the cautery.  Care was taken to preserve the anterior and posterior tonsillar pillars, as well as, the uvula.  Hemostasis was obtained with the cautery.  The nasopharynx was then examined for adenoid tissue and Thressa had just minimal adenoid tissue remaining.  Nothing was done in this area.  This completed the procedure. Penney was awoke from anesthesia and transferred to recovery room postoperatively doing well.  DISPOSITION:  [name] was discharged home later this morning on Amoxicillin suspension 400 mg b.i.d. for one week, Tylenol with Lortab elixir 1-2 tablespoons q.4h. p.r.n. pain.  Will have her follow up in my office in two weeks for recheck. Dictated by:   Kristine Garbe Ezzard Standing, M.D. Attending Physician:  Carlean Purl DD:  11/21/01 TD:  11/21/01 Job: [id number]

## 2011-04-13 NOTE — Discharge Summary (Signed)
Lynn Moody, Lynn Moody               ACCOUNT NO.:  [id number]   MEDICAL RECORD NO.:  [id number]          PATIENT TYPE:  INP   LOCATION:  9143                          FACILITY:  [facility]   PHYSICIAN:  Benn Moulder, M.D.      DATE OF BIRTH:  [birth year]   DATE OF ADMISSION:  01/16/2006  DATE OF DISCHARGE:  01/19/2006                                 DISCHARGE SUMMARY   ADMISSION DIAGNOSES:  1.  Intrauterine pregnancy at 39 weeks.  2.  Gestational diabetes.  3.  Large for gestational age.   DISCHARGE DIAGNOSES:  1.  Intrauterine pregnancy at 39 weeks.  2.  Status post primary low-transverse cesarean section for cephalopelvic      disproportion.  3.  Fail medical induction of labor.  4.  Pregnancy induced hypertension.  5.  Gestational diabetes.  6.  A viable 10 pound 8 ounce female.   OPERATIONS AND PROCEDURES:  Primary low-transverse cesarean section on  January 16, 2006.   LABORATORY DATA:  CBC on admission:  White blood cell count 14.9, hemoglobin  11.1, hematocrit 33.3 and platelet count 303.  CBC prior to discharge:  White blood cell count 17, hemoglobin 9.7, hematocrit 29.3 and primary low-  transverse 291.  PIH labs showed a BUN of 1, creatinine of 0.6, AST 28, ALT  18, LDH 152, uric acid 3.4 and platelet count 328.   HOSPITAL COURSE:  The patient is a 40 year old gravida 4, para 2-1-0-2, who  presented at 42 weeks estimated gestational age for induction of labor  secondary to gestational diabetes class A2 with a large for gestational age  fetus.  The patient was induced with Cytotec.  The patient was noted to have  increased blood pressures in labor and delivery elevated to 152/81.  PIH  labs were drawn and were within normal limits.  The patient was started on  magnesium once in active labor.  The patient's labor failed to progress  secondary to cephalopelvic disproportion and the patient was taken to the OR  for primary low-transverse cesarean section.  A 10 pound 8 ounce female  with  Apgars of 3 at one minute, 7 at five minutes and 9 at 10 minutes was  delivered.  Cord pH was 7.12.  A 3-vessel cord was removed manually.  Estimated blood loss was 800 mL.  The patient was maintained on magnesium 24  hours postpartum.  The patient did have mild headaches relieved with  Tylenol, but was without any visual changes or right upper quadrant pain in  the postpartum period.  The patient's blood pressures in the postpartum  period were 120s-140s systolic/80s-90s diastolic with fasting CBGs ranging  from 90-107 and 2-hour postprandial CBGs up to 137.  The patient was not on  any oral hypoglycemics in the postpartum period.  Mom is breastfeeding, and  will use condoms and foam for contraception.  Her female infant did receive a  circ prior to discharge.  The patient is to return to the MAU in 2-3 days  for staple removal.  The patient will need to have blood pressure and  fasting CBGs checked as an outpatient at her 6-week postpartum visit.  Mom  is B positive, rubella immune and GBS negative.   DISCHARGE MEDICATIONS:  1.  Percocet 5/325, 1 p.o. q.6h. p.r.n. pain.  2.  Ibuprofen 600 mg p.o. q.6h. p.r.n. pain.  3.  Prenatal vitamins once daily while breastfeeding.  4.  Iron sulfate 325 mg 1 p.o. b.i.d.  5.  Colace 100 mg 1 p.o. b.i.d. p.r.n. constipation.   WOUND CARE:  Per routine.  The patient to have staples removed in 2-3 days  at the MAU.   FOLLOW UP APPOINTMENTS:  At [facility] in 6 weeks.   DIET:  The patient is follow a carb-modified diet.   DISCHARGE INSTRUCTIONS:  The patient was instructed to return to the MAU if  she develops severe headache, vision changes, right upper quadrant pain or  has any other complications.      Benn Moulder, M.D.     MR/MEDQ  D:  01/19/2006  T:  01/19/2006  Job:  [id number]

## 2011-05-22 ENCOUNTER — Other Ambulatory Visit: Payer: Self-pay | Admitting: Family Medicine

## 2011-05-22 DIAGNOSIS — O26849 Uterine size-date discrepancy, unspecified trimester: Secondary | ICD-10-CM

## 2011-05-22 DIAGNOSIS — Z975 Presence of (intrauterine) contraceptive device: Secondary | ICD-10-CM

## 2011-05-28 ENCOUNTER — Ambulatory Visit (HOSPITAL_COMMUNITY)
Admission: RE | Admit: 2011-05-28 | Discharge: 2011-05-28 | Disposition: A | Discharge status: Home / Self Care | Payer: Medicaid Other | Source: Ambulatory Visit | Attending: Family Medicine | Admitting: Family Medicine

## 2011-05-28 DIAGNOSIS — Z975 Presence of (intrauterine) contraceptive device: Secondary | ICD-10-CM

## 2011-05-28 DIAGNOSIS — O26849 Uterine size-date discrepancy, unspecified trimester: Secondary | ICD-10-CM

## 2011-05-28 DIAGNOSIS — N938 Other specified abnormal uterine and vaginal bleeding: Secondary | ICD-10-CM | POA: Insufficient documentation

## 2011-05-28 DIAGNOSIS — N949 Unspecified condition associated with female genital organs and menstrual cycle: Secondary | ICD-10-CM | POA: Insufficient documentation

## 2011-05-28 DIAGNOSIS — Z30431 Encounter for routine checking of intrauterine contraceptive device: Secondary | ICD-10-CM | POA: Insufficient documentation

## 2011-05-28 DIAGNOSIS — N925 Other specified irregular menstruation: Secondary | ICD-10-CM | POA: Insufficient documentation

## 2011-07-08 IMAGING — US US PELVIS COMPLETE
1 series · 14 of 25 positions shown · non-contrast
Comparison: None.

CLINICAL DATA: Pelvic pain.  Dysfunctional uterine bleeding.  IUD
placed approximately 7 months ago.  LMP 05/09/2011



[Series 1: us pelvis complete · 14 of 55 slices shown]
[im 1/55]
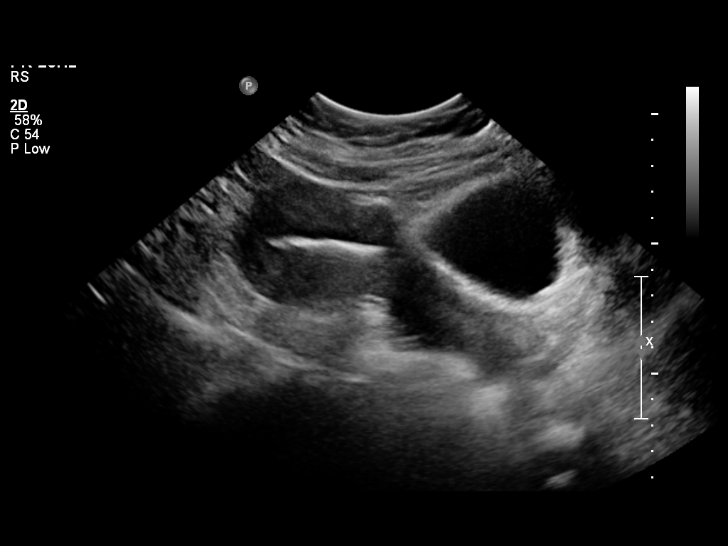
[im 5/55]
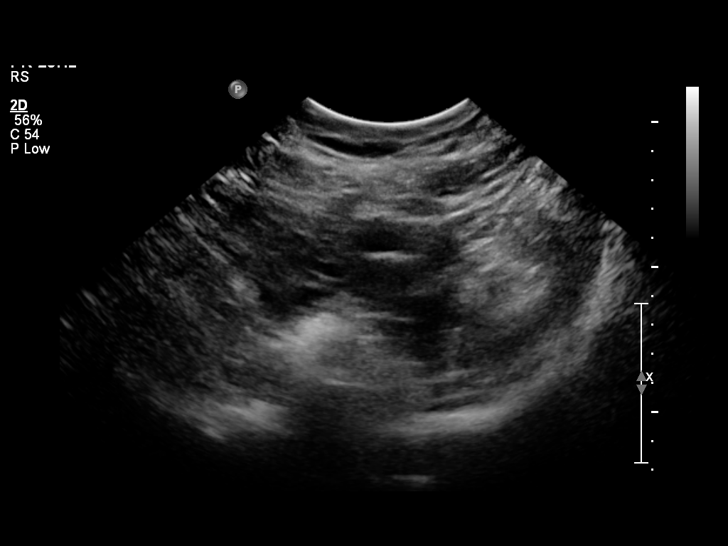
[im 10/55]
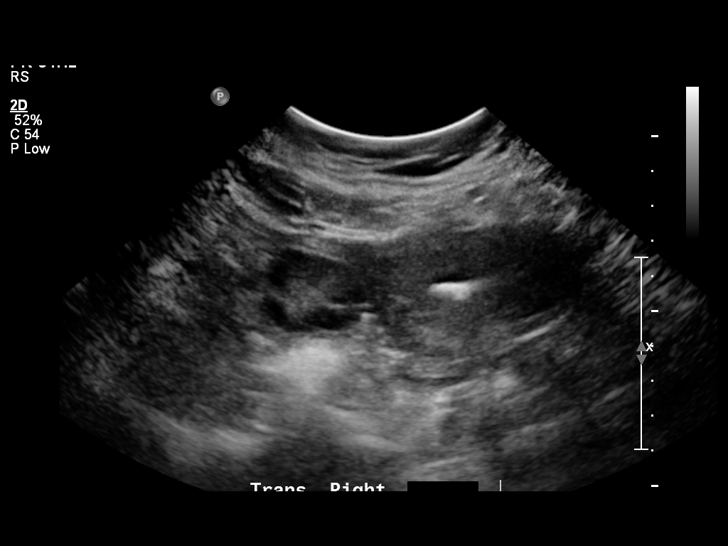
[im 14/55]
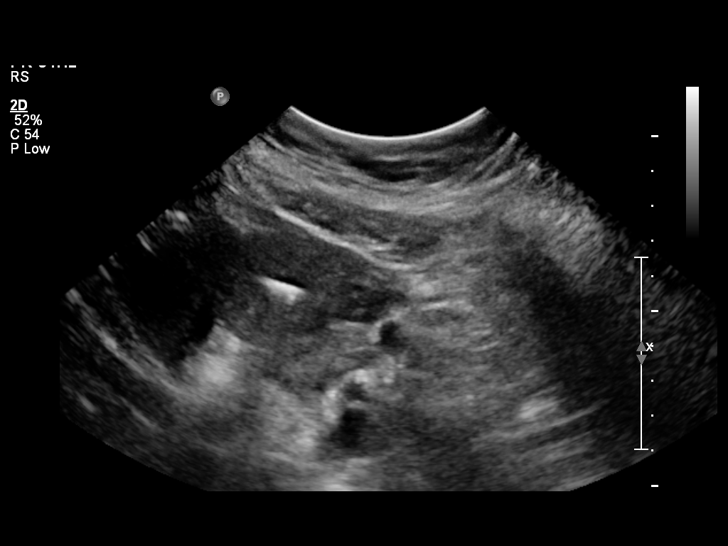
[im 19/55]
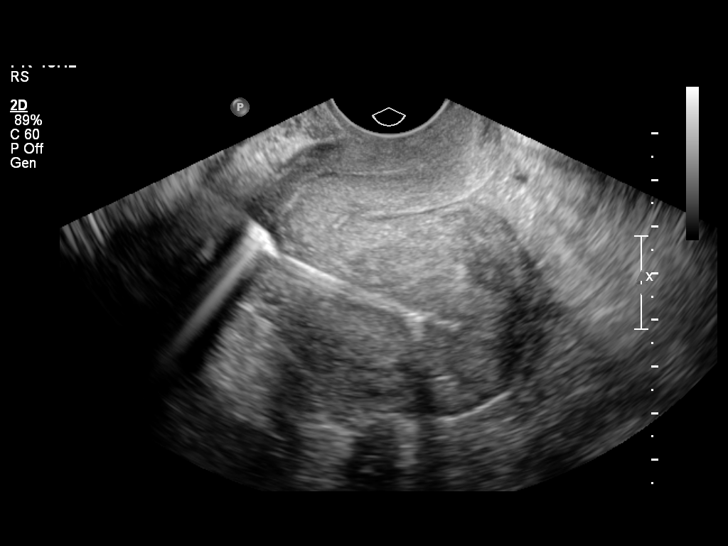
[im 21/55]
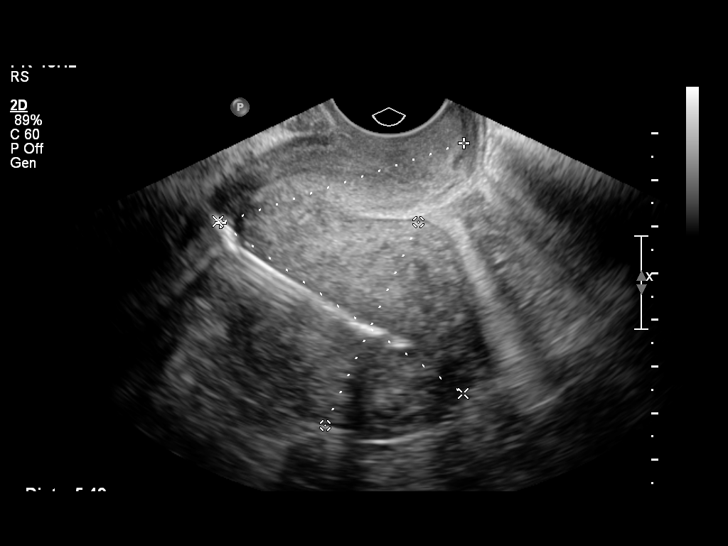
[im 25/55]
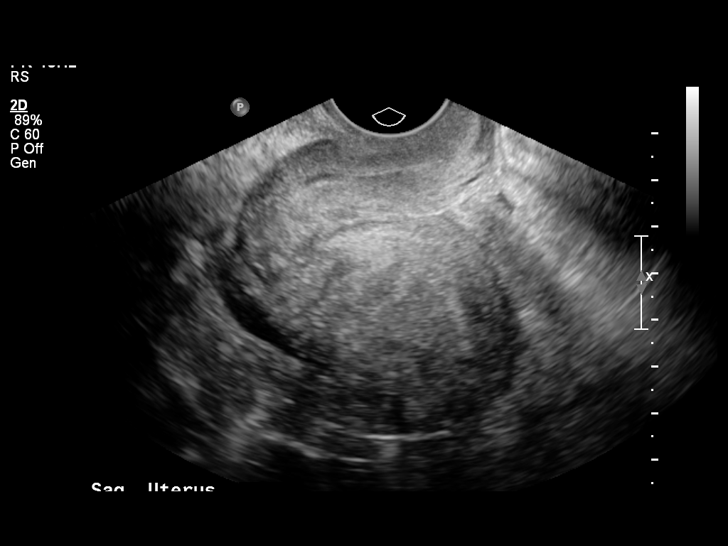
[im 30/55]
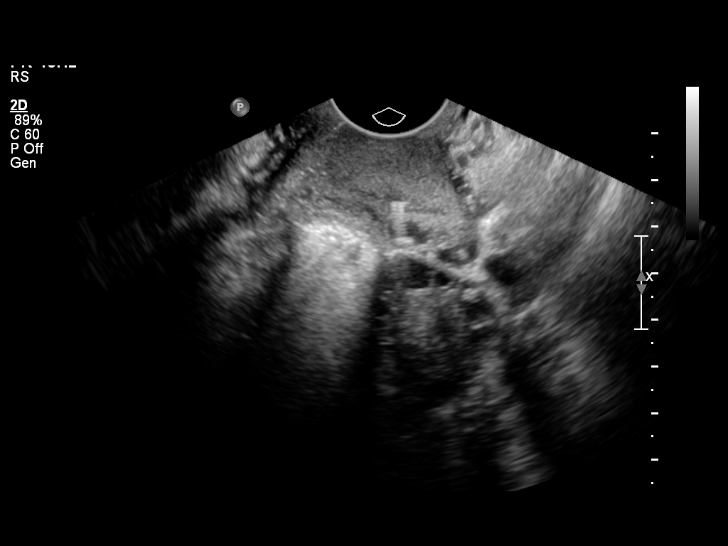
[im 34/55]
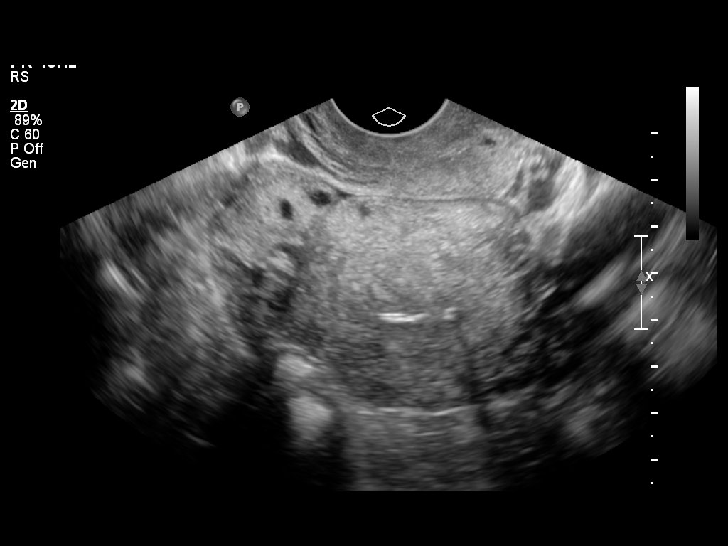
[im 37/55]
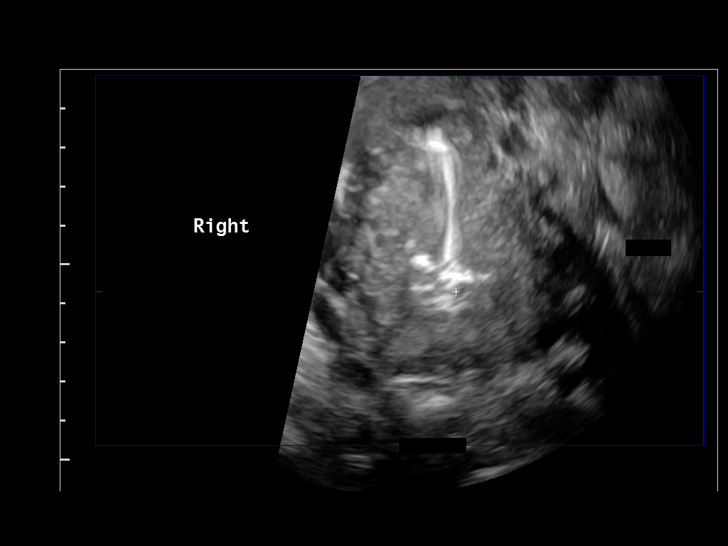
[im 41/55]
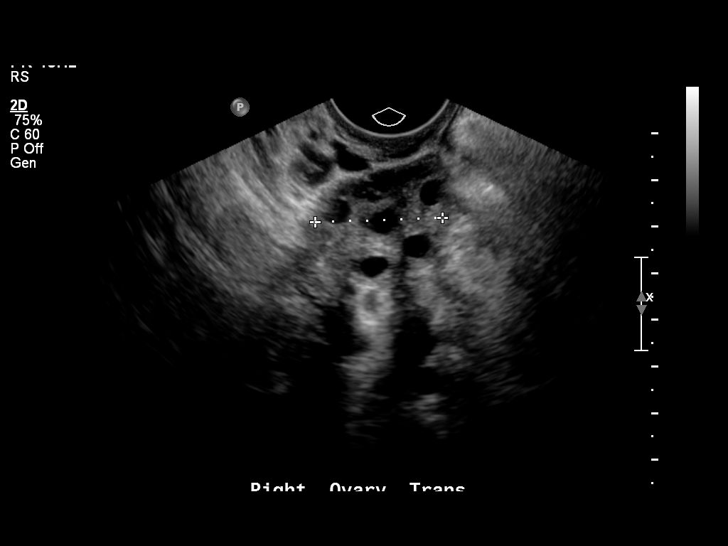
[im 46/55]
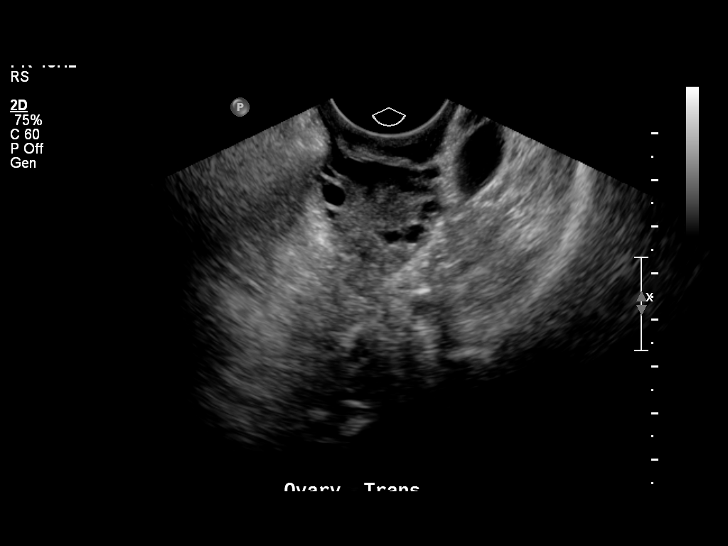
[im 50/55]
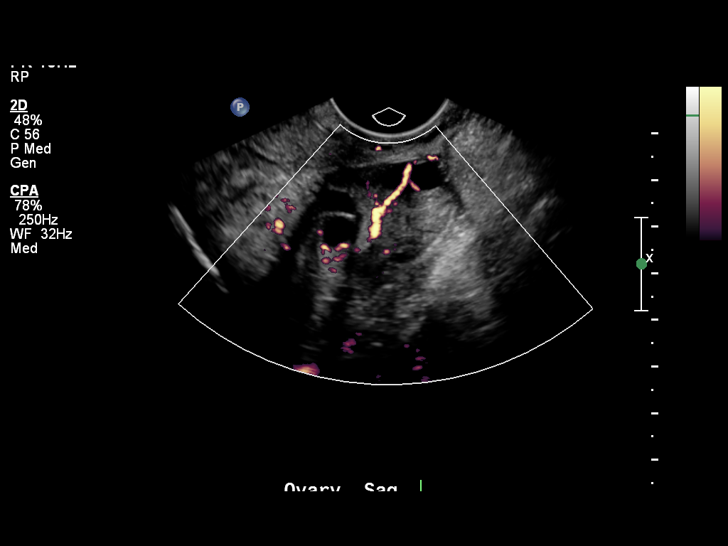
[im 55/55]
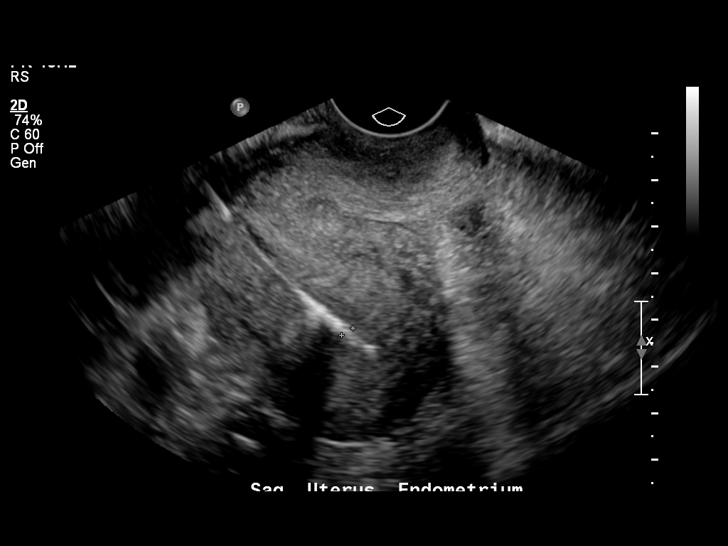

[14 of 25 positions shown; findings below may reference images not displayed]

FINDINGS: Uterus:  11.8 x 4.8 x 4.9 cm.  The uterus is retroflexed.  No
fibroids or other uterine masses are identified.

Endometrium:  IUD is seen in normal location.  Double layer
endometrial thickness measures 3 mm transvaginally.

Right ovary:  3.6 x 2.4 x 2.7 cm.  Normal appearance/no adnexal
mass.

Left ovary:  3.8 x 2.3 x 2.6 cm.  Normal appearance/no adnexal
mass.

Other findings:  No free fluid.
IMPRESSION: IUD in normal location.  No pelvic mass or other significant
abnormality identified.

## 2012-06-30 ENCOUNTER — Encounter: Payer: Self-pay | Admitting: Family Medicine

## 2014-07-31 ENCOUNTER — Ambulatory Visit (INDEPENDENT_AMBULATORY_CARE_PROVIDER_SITE_OTHER): Payer: Self-pay | Admitting: Emergency Medicine

## 2014-07-31 VITALS — BP 132/90 | HR 89 | Temp 98.4°F | Resp 16 | Ht 64.0 in | Wt 191.2 lb

## 2014-07-31 DIAGNOSIS — J029 Acute pharyngitis, unspecified: Secondary | ICD-10-CM

## 2014-07-31 LAB — POCT RAPID STREP A (OFFICE): RAPID STREP A SCREEN: NEGATIVE

## 2014-07-31 MED ORDER — IBUPROFEN 600 MG PO TABS: 600.0000 mg | ORAL_TABLET | Freq: Three times a day (TID) | ORAL | Status: DC

## 2014-07-31 MED ORDER — AMOXICILLIN-POT CLAVULANATE 875-125 MG PO TABS: 1.0000 | ORAL_TABLET | Freq: Two times a day (BID) | ORAL | Status: DC

## 2014-07-31 NOTE — Patient Instructions (Signed)
Abscessed Tooth An abscessed tooth is an infection around your tooth. It may be caused by holes or damage to the tooth (cavity) or a dental disease. An abscessed tooth causes mild to very bad pain in and around the tooth. See your dentist right away if you have tooth or gum pain. HOME CARE  Take your medicine as told. Finish it even if you start to feel better.  Do not drive after taking pain medicine.  Rinse your mouth (gargle) often with salt water ( teaspoon salt in 8 ounces of warm water).  Do not apply heat to the outside of your face. GET HELP RIGHT AWAY IF:   You have a temperature by mouth above 102 F (38.9 C), not controlled by medicine.  You have chills and a very bad headache.  You have problems breathing or swallowing.  Your mouth will not open.  You develop puffiness (swelling) on the neck or around the eye.  Your pain is not helped by medicine.  Your pain is getting worse instead of better. MAKE SURE YOU:   Understand these instructions.  Will watch your condition.  Will get help right away if you are not doing well or get worse. Document Released: 04/30/2008 Document Revised: 02/04/2012 Document Reviewed: 02/20/2011 Exit[employer] Patient Information 2015 [employer]. This information is not intended to replace advice given to you by your health care provider. Make sure you discuss any questions you have with your health care provider.  

## 2014-07-31 NOTE — Progress Notes (Signed)
   Subjective:    Patient ID: Lynn Moody, female    DOB: [birth year], 42 y.o.   MRN: [id number]  This chart was scribed for Arlyss Queen, MD by Erling Conte, Medical Scribe. This patient was seen in Room 11 and the patient's care was started at 10:31 AM.  Chief Complaint  Patient presents with  . Sore Throat    Had tooth pulled last Saturday, throat became sore 3 days later, swallowing is painful, jaw is still swollen from dental work     HPI HPI Comments: Lynn Moody is a 43 y.o. female who presents to the [facility] complaining of a constant, moderate sore throat for 3 days. She had her 2nd molar pulled 1 week ago, at the free dental clinic in [place], and states she continued to have pain to the site of the pulled tooth all week. She initially thought she could have dry socket. She admits that about 3 days ago the sore throat began and she began to have associated trouble swallowing, bilateral ear pain, sinus pressure and congestion. She admits she has been doing salt water rinses with no relief. She denies any fever, nausea, emesis, chills or abdominal pain.    Review of Systems  Constitutional: Negative for fever and chills.  HENT: Positive for congestion, dental problem (Had tooth pulled 1 week ago), ear pain, sinus pressure, sore throat and trouble swallowing. Negative for facial swelling.   Respiratory: Negative for shortness of breath.   Gastrointestinal: Negative for nausea, vomiting, abdominal pain and diarrhea.       Objective:   Physical Exam CONSTITUTIONAL: Well developed/well nourished HEAD: Normocephalic/atraumatic EYES: EOMI/PERRL ENMT: Mucous membranes moist Tonsils have been removed. Minimal redness to posterior pharynx. The left 2nd molar has been removed. The socket appears inflamed with mild purulence.  NECK: supple no meningeal signs there is mild swelling of the anterior cervical nodes . SPINE:entire spine nontender CV: S1/S2  noted, no murmurs/rubs/gallops noted LUNGS: Lungs are clear to auscultation bilaterally, no apparent distress ABDOMEN: soft, nontender, no rebound or guarding GU:no cva tenderness NEURO: Pt is awake/alert, moves all extremitiesx4 EXTREMITIES: pulses normal, full ROM SKIN: warm, color normal PSYCH: no abnormalities of mood noted  Results for orders placed in visit on 07/31/14  POCT RAPID STREP A (OFFICE)      Result Value Ref Range   Rapid Strep A Screen Negative  Negative        Assessment & Plan:  Patient here with infection of the extraction site from last Saturday. We'll cover with Augmentin

## 2014-12-23 DIAGNOSIS — E119 Type 2 diabetes mellitus without complications: Secondary | ICD-10-CM | POA: Insufficient documentation

## 2015-01-27 ENCOUNTER — Encounter: Payer: Medicaid Other | Attending: Family Medicine

## 2015-01-27 VITALS — Ht 64.0 in | Wt 196.1 lb

## 2015-01-27 DIAGNOSIS — E119 Type 2 diabetes mellitus without complications: Secondary | ICD-10-CM

## 2015-01-27 DIAGNOSIS — Z794 Long term (current) use of insulin: Secondary | ICD-10-CM | POA: Insufficient documentation

## 2015-01-27 DIAGNOSIS — Z713 Dietary counseling and surveillance: Secondary | ICD-10-CM | POA: Diagnosis not present

## 2015-01-28 NOTE — Progress Notes (Signed)

## 2015-02-03 DIAGNOSIS — E119 Type 2 diabetes mellitus without complications: Secondary | ICD-10-CM | POA: Diagnosis not present

## 2015-02-03 NOTE — Progress Notes (Signed)

## 2015-02-10 DIAGNOSIS — E119 Type 2 diabetes mellitus without complications: Secondary | ICD-10-CM

## 2015-02-15 NOTE — Progress Notes (Signed)
Patient was seen on 02/10/15 for the third of a series of three diabetes self-management courses at the [facility]. The following learning objectives were met by the patient during this class:  . State the amount of activity recommended for healthy living . Describe activities suitable for individual needs . Identify ways to regularly incorporate activity into daily life . Identify barriers to activity and ways to over come these barriers  Identify diabetes medications being personally used and their primary action for lowering glucose and possible side effects . Describe role of stress on blood glucose and develop strategies to address psychosocial issues . Identify diabetes complications and ways to prevent them  Explain how to manage diabetes during illness . Evaluate success in meeting personal goal . Establish 2-3 goals that they will plan to diligently work on until they return for the  68-monthfollow-up visit  Goals:   I will count my carb choices at most meals and snacks  I will be active 30 minutes or more 3 times a week  I will take my diabetes medications as scheduled  I will eat less unhealthy fats by eating less carbohydrates  I will test my glucose at least 2 times a day, 7 days a week  I will look at patterns in my record book at least 4 days a month  To help manage stress I will  yoga at least 2 times a week  Your patient has identified these potential barriers to change:  Stress  Your patient has identified their diabetes self-care support plan as  Family Support Plan:  Attend Core 4 in 4 months

## 2015-03-15 DIAGNOSIS — Z2821 Immunization not carried out because of patient refusal: Secondary | ICD-10-CM | POA: Insufficient documentation

## 2015-04-13 ENCOUNTER — Other Ambulatory Visit: Payer: Self-pay

## 2015-05-18 DIAGNOSIS — D72829 Elevated white blood cell count, unspecified: Secondary | ICD-10-CM | POA: Insufficient documentation

## 2015-07-10 ENCOUNTER — Inpatient Hospital Stay (HOSPITAL_COMMUNITY): Payer: Medicaid Other

## 2015-07-10 ENCOUNTER — Encounter (HOSPITAL_COMMUNITY): Payer: Self-pay | Admitting: *Deleted

## 2015-07-10 ENCOUNTER — Inpatient Hospital Stay (HOSPITAL_COMMUNITY)
Admission: AD | Admit: 2015-07-10 | Discharge: 2015-07-10 | Disposition: A | Discharge status: Home / Self Care | Payer: Medicaid Other | Source: Ambulatory Visit | Attending: Obstetrics & Gynecology | Admitting: Obstetrics & Gynecology

## 2015-07-10 DIAGNOSIS — R109 Unspecified abdominal pain: Secondary | ICD-10-CM | POA: Diagnosis not present

## 2015-07-10 DIAGNOSIS — Z3A01 Less than 8 weeks gestation of pregnancy: Secondary | ICD-10-CM | POA: Diagnosis not present

## 2015-07-10 DIAGNOSIS — O9989 Other specified diseases and conditions complicating pregnancy, childbirth and the puerperium: Secondary | ICD-10-CM | POA: Diagnosis not present

## 2015-07-10 DIAGNOSIS — O3411 Maternal care for benign tumor of corpus uteri, first trimester: Secondary | ICD-10-CM | POA: Diagnosis not present

## 2015-07-10 DIAGNOSIS — R1032 Left lower quadrant pain: Secondary | ICD-10-CM | POA: Insufficient documentation

## 2015-07-10 DIAGNOSIS — D259 Leiomyoma of uterus, unspecified: Secondary | ICD-10-CM | POA: Diagnosis not present

## 2015-07-10 DIAGNOSIS — O3680X0 Pregnancy with inconclusive fetal viability, not applicable or unspecified: Secondary | ICD-10-CM

## 2015-07-10 DIAGNOSIS — O26899 Other specified pregnancy related conditions, unspecified trimester: Secondary | ICD-10-CM

## 2015-07-10 LAB — URINALYSIS, ROUTINE W REFLEX MICROSCOPIC
Bilirubin Urine: NEGATIVE
Glucose, UA: 500 mg/dL — AB
HGB URINE DIPSTICK: NEGATIVE
KETONES UR: NEGATIVE mg/dL
Leukocytes, UA: NEGATIVE
NITRITE: NEGATIVE
PH: 6 (ref 5.0–8.0)
PROTEIN: NEGATIVE mg/dL
SPECIFIC GRAVITY, URINE: 1.01 (ref 1.005–1.030)
Urobilinogen, UA: 0.2 mg/dL (ref 0.0–1.0)

## 2015-07-10 LAB — WET PREP, GENITAL
Clue Cells Wet Prep HPF POC: NONE SEEN
TRICH WET PREP: NONE SEEN
WBC WET PREP: NONE SEEN
Yeast Wet Prep HPF POC: NONE SEEN

## 2015-07-10 LAB — CBC WITH DIFFERENTIAL/PLATELET
BASOS PCT: 1 % (ref 0–1)
Basophils Absolute: 0.1 10*3/uL (ref 0.0–0.1)
EOS ABS: 0.2 10*3/uL (ref 0.0–0.7)
Eosinophils Relative: 1 % (ref 0–5)
HEMATOCRIT: 33.6 % — AB (ref 36.0–46.0)
Hemoglobin: 10.9 g/dL — ABNORMAL LOW (ref 12.0–15.0)
LYMPHS ABS: 2.7 10*3/uL (ref 0.7–4.0)
Lymphocytes Relative: 21 % (ref 12–46)
MCH: 24.8 pg — ABNORMAL LOW (ref 26.0–34.0)
MCHC: 32.4 g/dL (ref 30.0–36.0)
MCV: 76.4 fL — ABNORMAL LOW (ref 78.0–100.0)
MONO ABS: 1.4 10*3/uL — AB (ref 0.1–1.0)
Monocytes Relative: 11 % (ref 3–12)
NEUTROS ABS: 8.4 10*3/uL — AB (ref 1.7–7.7)
Neutrophils Relative %: 66 % (ref 43–77)
PLATELETS: 383 10*3/uL (ref 150–400)
RBC: 4.4 MIL/uL (ref 3.87–5.11)
RDW: 14.1 % (ref 11.5–15.5)
WBC: 12.8 10*3/uL — ABNORMAL HIGH (ref 4.0–10.5)

## 2015-07-10 LAB — POCT PREGNANCY, URINE: Preg Test, Ur: POSITIVE — AB

## 2015-07-10 LAB — HCG, QUANTITATIVE, PREGNANCY: hCG, Beta Chain, Quant, S: 715 m[IU]/mL — ABNORMAL HIGH (ref <5)

## 2015-07-10 NOTE — MAU Provider Note (Signed)
History     CSN: [id number]  Arrival date and time: 07/10/15 1717   First Provider Initiated Contact with Patient 07/10/15 1742      Chief Complaint  Patient presents with  . Possible Pregnancy  . left side pain    HPI  Lynn Moody is a 44 y.o. [id number] at [redacted]w[redacted]d who presents to MAU today with complaint of LLQ abdominal pain x 2 weeks. The patient states that she has had pain off and on x 2 weeks. She states no pain now and pain is 5/10 at the worst. She has not taken any pain medications. She denies vaginal bleeding, discharge, UTI symptoms N/V/D or constipation or fever. She states that previous pregnancies have been high risk due to DM, preterm labor and + antiphospholipid antibodies with first two pregnancies.   OB History    Gravida Para Term Preterm AB TAB SAB Ectopic Multiple Living   7 4   2  2   5       Past Medical History  Diagnosis Date  . Diabetes mellitus without complication     Past Surgical History  Procedure Laterality Date  . Cesarean section    . Dental surgery      History reviewed. No pertinent family history.  Social History  Substance Use Topics  . Smoking status: Never Smoker   . Smokeless tobacco: None  . Alcohol Use: No    Allergies: No Known Allergies  Prescriptions prior to admission  Medication Sig Dispense Refill Last Dose  . insulin detemir (LEVEMIR) 100 UNIT/ML injection Inject 11 Units into the skin at bedtime.   07/09/2015 at Unknown time  . Prenatal Vit-Fe Fumarate-FA (PRENATAL MULTIVITAMIN) TABS tablet Take 1 tablet by mouth daily.   07/10/2015 at Unknown time    Review of Systems  Constitutional: Negative for fever and malaise/fatigue.  Gastrointestinal: Positive for abdominal pain. Negative for nausea, vomiting, diarrhea and constipation.  Genitourinary: Negative for dysuria, urgency and frequency.       Neg - vaginal bleeding, discharge   Physical Exam   Blood pressure 117/85, pulse 89, temperature 98 F (36.7 C),  resp. rate 18, last menstrual period 05/29/2015.  Physical Exam  Nursing note and vitals reviewed. Constitutional: She is oriented to person, place, and time. She appears well-developed and well-nourished. No distress.  HENT:  Head: Normocephalic and atraumatic.  Cardiovascular: Normal rate.   Respiratory: Effort normal.  GI: Soft. She exhibits no distension and no mass. There is no tenderness. There is no rebound and no guarding.  Genitourinary: Uterus is enlarged (slightly). Uterus is not tender. Cervix exhibits no motion tenderness, no discharge and no friability. Right adnexum displays no mass and no tenderness. Left adnexum displays no mass and no tenderness. No bleeding in the vagina. No vaginal discharge found.  Neurological: She is alert and oriented to person, place, and time.  Skin: Skin is warm and dry. No erythema.  Psychiatric: She has a normal mood and affect.    Results for orders placed or performed during the hospital encounter of 07/10/15 (from the past 24 hour(s))  Urinalysis, Routine w reflex microscopic (not at [facility])     Status: Abnormal   Collection Time: 07/10/15  5:20 PM  Result Value Ref Range   Color, Urine YELLOW YELLOW   APPearance CLEAR CLEAR   Specific Gravity, Urine 1.010 1.005 - 1.030   pH 6.0 5.0 - 8.0   Glucose, UA 500 (A) NEGATIVE mg/dL   Hgb urine dipstick NEGATIVE  NEGATIVE   Bilirubin Urine NEGATIVE NEGATIVE   Ketones, ur NEGATIVE NEGATIVE mg/dL   Protein, ur NEGATIVE NEGATIVE mg/dL   Urobilinogen, UA 0.2 0.0 - 1.0 mg/dL   Nitrite NEGATIVE NEGATIVE   Leukocytes, UA NEGATIVE NEGATIVE  Pregnancy, urine POC     Status: Abnormal   Collection Time: 07/10/15  5:37 PM  Result Value Ref Range   Preg Test, Ur POSITIVE (A) NEGATIVE  Wet prep, genital     Status: None   Collection Time: 07/10/15  5:50 PM  Result Value Ref Range   Yeast Wet Prep HPF POC NONE SEEN NONE SEEN   Trich, Wet Prep NONE SEEN NONE SEEN   Clue Cells Wet Prep HPF POC NONE SEEN  NONE SEEN   WBC, Wet Prep HPF POC NONE SEEN NONE SEEN  CBC with Differential/Platelet     Status: Abnormal   Collection Time: 07/10/15  6:06 PM  Result Value Ref Range   WBC 12.8 (H) 4.0 - 10.5 K/uL   RBC 4.40 3.87 - 5.11 MIL/uL   Hemoglobin 10.9 (L) 12.0 - 15.0 g/dL   HCT 33.6 (L) 36.0 - 46.0 %   MCV 76.4 (L) 78.0 - 100.0 fL   MCH 24.8 (L) 26.0 - 34.0 pg   MCHC 32.4 30.0 - 36.0 g/dL   RDW 14.1 11.5 - 15.5 %   Platelets 383 150 - 400 K/uL   Neutrophils Relative % 66 43 - 77 %   Neutro Abs 8.4 (H) 1.7 - 7.7 K/uL   Lymphocytes Relative 21 12 - 46 %   Lymphs Abs 2.7 0.7 - 4.0 K/uL   Monocytes Relative 11 3 - 12 %   Monocytes Absolute 1.4 (H) 0.1 - 1.0 K/uL   Eosinophils Relative 1 0 - 5 %   Eosinophils Absolute 0.2 0.0 - 0.7 K/uL   Basophils Relative 1 0 - 1 %   Basophils Absolute 0.1 0.0 - 0.1 K/uL  hCG, quantitative, pregnancy     Status: Abnormal   Collection Time: 07/10/15  6:06 PM  Result Value Ref Range   hCG, Beta Chain, Quant, S 715 (H) <5 mIU/mL   US Ob Comp Less 14 Wks  07/10/2015   CLINICAL DATA:  Acute onset of pelvic cramping.  Initial encounter.  EXAM: OBSTETRIC <14 WK [place] AND TRANSVAGINAL OB US  TECHNIQUE: Both transabdominal and transvaginal ultrasound examinations were performed for complete evaluation of the gestation as well as the maternal uterus, adnexal regions, and pelvic cul-de-sac. Transvaginal technique was performed to assess early pregnancy.  COMPARISON:  Pelvic ultrasound performed 05/28/2011  FINDINGS: Intrauterine gestational sac: None seen.  Yolk sac:  N/A  Embryo:  N/A  Maternal uterus/adnexae: A 1.8 cm left anterior fundal fibroid is noted. The uterus is otherwise unremarkable in appearance.  The ovaries are unremarkable in appearance. The right ovary measures 3.9 x 2.1 x 2.4 cm, while the left ovary measures 3.0 x 2.2 x 2.5 cm. No suspicious adnexal masses are seen; there is no evidence for ovarian torsion.  Trace free fluid is seen within the pelvic  cul-de-sac.  IMPRESSION: 1. No intrauterine gestational sac seen. No evidence for ectopic pregnancy at this time. Would correlate with quantitative beta HCG level, and consider pelvic ultrasound in 2 weeks for further evaluation, if quantitative beta HCG levels continue to rise. 2. 1.8 cm left anterior fundal fibroid noted.   Electronically Signed   By: Garald Balding M.D.   On: 07/10/2015 18:51   US Ob Transvaginal  07/10/2015  CLINICAL DATA:  Acute onset of pelvic cramping.  Initial encounter.  EXAM: OBSTETRIC <14 WK [place] AND TRANSVAGINAL OB US  TECHNIQUE: Both transabdominal and transvaginal ultrasound examinations were performed for complete evaluation of the gestation as well as the maternal uterus, adnexal regions, and pelvic cul-de-sac. Transvaginal technique was performed to assess early pregnancy.  COMPARISON:  Pelvic ultrasound performed 05/28/2011  FINDINGS: Intrauterine gestational sac: None seen.  Yolk sac:  N/A  Embryo:  N/A  Maternal uterus/adnexae: A 1.8 cm left anterior fundal fibroid is noted. The uterus is otherwise unremarkable in appearance.  The ovaries are unremarkable in appearance. The right ovary measures 3.9 x 2.1 x 2.4 cm, while the left ovary measures 3.0 x 2.2 x 2.5 cm. No suspicious adnexal masses are seen; there is no evidence for ovarian torsion.  Trace free fluid is seen within the pelvic cul-de-sac.  IMPRESSION: 1. No intrauterine gestational sac seen. No evidence for ectopic pregnancy at this time. Would correlate with quantitative beta HCG level, and consider pelvic ultrasound in 2 weeks for further evaluation, if quantitative beta HCG levels continue to rise. 2. 1.8 cm left anterior fundal fibroid noted.   Electronically Signed   By: Garald Balding M.D.   On: 07/10/2015 18:51    MAU Course  Procedures None  MDM +UPT UA, wet prep, GC/chlamydia, CBC, quant hCG, HIV, RPR and [place] today to rule out ectopic pregnancy B + blood type  Assessment and Plan  A: Pregnancy of  unknown location Uterine fibroid  P: Discharge home Tylenol PRN for pain Ectopic precautions discussed Patient advised to follow-up in MAU in 48 hours for repeat labs or sooner if her condition were to change or worsen  Luvenia Redden, PA-C  07/10/2015, 7:19 PM

## 2015-07-10 NOTE — MAU Note (Signed)
Pt presents to MAU with complaints of pain in her left lower side on and off for a couple of weeks. Positive home pregnancy test on Thursday August the 11th. Denies any vaginal bleeding

## 2015-07-10 NOTE — Discharge Instructions (Signed)
First Trimester of Pregnancy The first trimester of pregnancy is from week 1 until the end of week 12 (months 1 through 3). During this time, your baby will begin to develop inside you. At 6-8 weeks, the eyes and face are formed, and the heartbeat can be seen on ultrasound. At the end of 12 weeks, all the baby's organs are formed. Prenatal care is all the medical care you receive before the birth of your baby. Make sure you get good prenatal care and follow all of your doctor's instructions. HOME CARE  Medicines  Take medicine only as told by your doctor. Some medicines are safe and some are not during pregnancy.  Take your prenatal vitamins as told by your doctor.  Take medicine that helps you poop (stool softener) as needed if your doctor says it is okay. Diet  Eat regular, healthy meals.  Your doctor will tell you the amount of weight gain that is right for you.  Avoid raw meat and uncooked cheese.  If you feel sick to your stomach (nauseous) or throw up (vomit):  Eat 4 or 5 small meals a day instead of 3 large meals.  Try eating a few soda crackers.  Drink liquids between meals instead of during meals.  If you have a hard time pooping (constipation):  Eat high-fiber foods like fresh vegetables, fruit, and whole grains.  Drink enough fluids to keep your pee (urine) clear or pale yellow. Activity and Exercise  Exercise only as told by your doctor. Stop exercising if you have cramps or pain in your lower belly (abdomen) or low back.  Try to avoid standing for long periods of time. Move your legs often if you must stand in one place for a long time.  Avoid heavy lifting.  Wear low-heeled shoes. Sit and stand up straight.  You can have sex unless your doctor tells you not to. Relief of Pain or Discomfort  Wear a good support bra if your breasts are sore.  Take warm water baths (sitz baths) to soothe pain or discomfort caused by hemorrhoids. Use hemorrhoid cream if your  doctor says it is okay.  Rest with your legs raised if you have leg cramps or low back pain.  Wear support hose if you have puffy, bulging veins (varicose veins) in your legs. Raise (elevate) your feet for 15 minutes, 3-4 times a day. Limit salt in your diet. Prenatal Care  Schedule your prenatal visits by the twelfth week of pregnancy.  Write down your questions. Take them to your prenatal visits.  Keep all your prenatal visits as told by your doctor. Safety  Wear your seat belt at all times when driving.  Make a list of emergency phone numbers. The list should include numbers for family, friends, the hospital, and police and fire departments. General Tips  Ask your doctor for a referral to a local prenatal class. Begin classes no later than at the start of month 6 of your pregnancy.  Ask for help if you need counseling or help with nutrition. Your doctor can give you advice or tell you where to go for help.  Do not use hot tubs, steam rooms, or saunas.  Do not douche or use tampons or scented sanitary pads.  Do not cross your legs for long periods of time.  Avoid litter boxes and soil used by cats.  Avoid all smoking, herbs, and alcohol. Avoid drugs not approved by your doctor.  Visit your dentist. At home, brush your teeth   with a soft toothbrush. Be gentle when you floss. GET HELP IF:  You are dizzy.  You have mild cramps or pressure in your lower belly.  You have a nagging pain in your belly area.  You continue to feel sick to your stomach, throw up, or have watery poop (diarrhea).  You have a bad smelling fluid coming from your vagina.  You have pain with peeing (urination).  You have increased puffiness (swelling) in your face, hands, legs, or ankles. GET HELP RIGHT AWAY IF:   You have a fever.  You are leaking fluid from your vagina.  You have spotting or bleeding from your vagina.  You have very bad belly cramping or pain.  You gain or lose weight  rapidly.  You throw up blood. It may look like coffee grounds.  You are around people who have German measles, fifth disease, or chickenpox.  You have a very bad headache.  You have shortness of breath.  You have any kind of trauma, such as from a fall or a car accident. Document Released: 04/30/2008 Document Revised: 03/29/2014 Document Reviewed: 09/22/2013 ExitCare Patient Information 2015 [employer]. This information is not intended to replace advice given to you by your health care provider. Make sure you discuss any questions you have with your health care provider.  

## 2015-07-11 LAB — HIV ANTIBODY (ROUTINE TESTING W REFLEX): HIV Screen 4th Generation wRfx: NONREACTIVE

## 2015-07-11 LAB — RPR: RPR: NONREACTIVE

## 2015-07-11 LAB — GC/CHLAMYDIA PROBE AMP (~~LOC~~) NOT AT ARMC
Chlamydia: NEGATIVE
NEISSERIA GONORRHEA: NEGATIVE

## 2015-07-12 ENCOUNTER — Inpatient Hospital Stay (HOSPITAL_COMMUNITY)
Admission: AD | Admit: 2015-07-12 | Discharge: 2015-07-12 | Disposition: A | Discharge status: Home / Self Care | Payer: Medicaid Other | Source: Ambulatory Visit | Attending: Obstetrics and Gynecology | Admitting: Obstetrics and Gynecology

## 2015-07-12 DIAGNOSIS — R109 Unspecified abdominal pain: Secondary | ICD-10-CM

## 2015-07-12 DIAGNOSIS — Z3A01 Less than 8 weeks gestation of pregnancy: Secondary | ICD-10-CM | POA: Insufficient documentation

## 2015-07-12 DIAGNOSIS — O9989 Other specified diseases and conditions complicating pregnancy, childbirth and the puerperium: Secondary | ICD-10-CM | POA: Diagnosis not present

## 2015-07-12 DIAGNOSIS — O3680X0 Pregnancy with inconclusive fetal viability, not applicable or unspecified: Secondary | ICD-10-CM

## 2015-07-12 LAB — HCG, QUANTITATIVE, PREGNANCY: hCG, Beta Chain, Quant, S: 1089 m[IU]/mL — ABNORMAL HIGH (ref <5)

## 2015-07-12 NOTE — MAU Note (Signed)
Pt. Here for follow up blood work

## 2015-07-12 NOTE — MAU Provider Note (Signed)
S: 44 y.o. [id number] @[redacted]w[redacted]d  by LMP presents to MAU for repeat hcg.  She reports abdominal pain, similar to mild pain at previous visit and reports it is unchanged.  She denies vaginal bleeding, vaginal itching/burning, urinary symptoms, h/a, dizziness, n/v, or fever/chills.    Her quant hcg on 8/14 was 714 and ultrasound showed no IUP or ectopic.  Small 1.8 cm fibroid noted.   O: BP 127/84 mmHg  Pulse 92  Temp(Src) 98.5 F (36.9 C) (Oral)  LMP 05/29/2015  Physical Examination: General appearance - alert, well appearing, and in no distress, oriented to person, place, and time and acyanotic, in no respiratory distress  Results for orders placed or performed during the hospital encounter of 07/12/15 (from the past 24 hour(s))  hCG, quantitative, pregnancy     Status: Abnormal   Collection Time: 07/12/15  6:17 PM  Result Value Ref Range   hCG, Beta Chain, Quant, S 1089 (H) <5 mIU/mL       A: 1. Pregnancy of unknown anatomic location     P: Consult Dr Elly Modena, reviewed assessment and labs. D/C home with ectopic/bleeding precautions Discussed fibroid with pt, reassurance provided Return to MAU in 48 hours for another repeat quant hcg and ultrasound Return sooner as needed for emergencies  LEFTWICH-KIRBY, LISA, CNM 2:18 PM

## 2015-07-14 ENCOUNTER — Inpatient Hospital Stay (HOSPITAL_COMMUNITY)
Admission: AD | Admit: 2015-07-14 | Discharge: 2015-07-14 | Disposition: A | Discharge status: Home / Self Care | Payer: Medicaid Other | Source: Ambulatory Visit | Attending: Obstetrics & Gynecology | Admitting: Obstetrics & Gynecology

## 2015-07-14 ENCOUNTER — Inpatient Hospital Stay (HOSPITAL_COMMUNITY): Payer: Medicaid Other

## 2015-07-14 DIAGNOSIS — O9989 Other specified diseases and conditions complicating pregnancy, childbirth and the puerperium: Secondary | ICD-10-CM

## 2015-07-14 DIAGNOSIS — R1032 Left lower quadrant pain: Secondary | ICD-10-CM

## 2015-07-14 DIAGNOSIS — Z3A01 Less than 8 weeks gestation of pregnancy: Secondary | ICD-10-CM | POA: Insufficient documentation

## 2015-07-14 DIAGNOSIS — Z3201 Encounter for pregnancy test, result positive: Secondary | ICD-10-CM | POA: Insufficient documentation

## 2015-07-14 DIAGNOSIS — O3680X0 Pregnancy with inconclusive fetal viability, not applicable or unspecified: Secondary | ICD-10-CM

## 2015-07-14 LAB — HCG, QUANTITATIVE, PREGNANCY: hCG, Beta Chain, Quant, S: 1742 m[IU]/mL — ABNORMAL HIGH (ref <5)

## 2015-07-14 NOTE — MAU Note (Signed)
PT  SAYS SHE WAS  HERE  ON SUN AND  Tuesday   AND HERE  TO REPEAT  LABS   AND  U/S.     NO BLEEDING .    NO PAIN.

## 2015-07-14 NOTE — Discharge Instructions (Signed)
°Ectopic Pregnancy °An ectopic pregnancy is when the fertilized egg attaches (implants) outside the uterus. Most ectopic pregnancies occur in the fallopian tube. Rarely do ectopic pregnancies occur on the ovary, intestine, pelvis, or cervix. In an ectopic pregnancy, the fertilized egg does not have the ability to develop into a normal, healthy baby.  °A ruptured ectopic pregnancy is one in which the fallopian tube gets torn or bursts and results in internal bleeding. Often there is intense abdominal pain, and sometimes, vaginal bleeding. Having an ectopic pregnancy can be life threatening. If left untreated, this dangerous condition can lead to a blood transfusion, abdominal surgery, or even death. °CAUSES  °Damage to the fallopian tubes is the suspected cause in most ectopic pregnancies.  °RISK FACTORS °Depending on your circumstances, the risk of having an ectopic pregnancy will vary. The level of risk can be divided into three categories. °High Risk °· You have gone through infertility treatment. °· You have had a previous ectopic pregnancy. °· You have had previous tubal surgery. °· You have had previous surgery to have the fallopian tubes tied (tubal ligation). °· You have tubal problems or diseases. °· You have been exposed to DES. DES is a medicine that was used until 1971 and had effects on babies whose mothers took the medicine. °· You become pregnant while using an intrauterine device (IUD) for birth control.  °Moderate Risk °· You have a history of infertility. °· You have a history of a sexually transmitted infection (STI). °· You have a history of pelvic inflammatory disease (PID). °· You have scarring from endometriosis. °· You have multiple sexual partners. °· You smoke.  °Low Risk °· You have had previous pelvic surgery. °· You use vaginal douching. °· You became sexually active before 46 years of age. °SIGNS AND SYMPTOMS  °An ectopic pregnancy should be suspected in anyone who has missed a period  and has abdominal pain or bleeding. °· You may experience normal pregnancy symptoms, such as: °¨ Nausea. °¨ Tiredness. °¨ Breast tenderness. °· Other symptoms may include: °¨ Pain with intercourse. °¨ Irregular vaginal bleeding or spotting. °¨ Cramping or pain on one side or in the lower abdomen. °¨ Fast heartbeat. °¨ Passing out while having a bowel movement. °· Symptoms of a ruptured ectopic pregnancy and internal bleeding may include: °¨ Sudden, severe pain in the abdomen and pelvis. °¨ Dizziness or fainting. °¨ Pain in the shoulder area. °DIAGNOSIS  °Tests that may be performed include: °· A pregnancy test. °· An ultrasound test. °· Testing the specific level of pregnancy hormone in the bloodstream. °· Taking a sample of uterus tissue (dilation and curettage, D&C). °· Surgery to perform a visual exam of the inside of the abdomen using a thin, lighted tube with a tiny camera on the end (laparoscope). °TREATMENT  °An injection of a medicine called methotrexate may be given. This medicine causes the pregnancy tissue to be absorbed. It is given if: °· The diagnosis is made early. °· The fallopian tube has not ruptured. °· You are considered to be a good candidate for the medicine. °Usually, pregnancy hormone blood levels are checked after methotrexate treatment. This is to be sure the medicine is effective. It may take 4-6 weeks for the pregnancy to be absorbed (though most pregnancies will be absorbed by 3 weeks). °Surgical treatment may be needed. A laparoscope may be used to remove the pregnancy tissue. If severe internal bleeding occurs, a cut (incision) may be made in the lower abdomen (laparotomy), and the ectopic   pregnancy is removed. This stops the bleeding. Part of the fallopian tube, or the whole tube, may be removed as well (salpingectomy). After surgery, pregnancy hormone tests may be done to be sure there is no pregnancy tissue left. You may receive a Rho (D) immune globulin shot if you are Rh negative  and the father is Rh positive, or if you do not know the Rh type of the father. This is to prevent problems with any future pregnancy. °SEEK IMMEDIATE MEDICAL CARE IF:  °You have any symptoms of an ectopic pregnancy. This is a medical emergency. °MAKE SURE YOU: °· Understand these instructions. °· Will watch your condition. °· Will get help right away if you are not doing well or get worse. °Document Released: 12/20/2004 Document Revised: 03/29/2014 Document Reviewed: 06/11/2013 °ExitCare® Patient Information ©2015 [employer]. This information is not intended to replace advice given to you by your health care provider. Make sure you discuss any questions you have with your health care provider. ° ° °

## 2015-07-14 NOTE — MAU Provider Note (Signed)
Subjective:   Pt is a 44 y.o. [id number] here for follow-up BHCG.  Upon review of the records patient was first seen on 07/10/15.   BHCG on that day was 714.  Ultrasound showed no IUP or ectopic. Small 1.8cm fibroid noted.  Pt here today with no report of abdominal pain or vaginal bleeding.   Last seen in MAU on 07/12/15.  BHCG- 1089  Lynn Moody is a 44 y.o. [id number] at [redacted]w[redacted]d who presents today for FU HCG. She states that the LLQ pain she had originally presented with on 07/10/15 has resolved. She denies any vaginal bleeding. She rates her pain 0/10 at this time. She denies any aggravating or alleviating factors. She has not tried any treatments for the pain.  She denies any fever, nausea, vomiting, abdominal pain or dysuria.   Objective: Physical Exam  Filed Vitals:   07/14/15 1919  BP: 119/79  Pulse: 88  Temp: 98.6 F (37 C)  Resp: 20   Constitutional: She is oriented to person, place, and time. She appears well-developed and well-nourished. No distress.  Neck: Normal range of motion.  Head: normocephalic Pulmonary/Chest: Effort normal. No respiratory distress.  Abdomen: soft, non-tender  Musculoskeletal: Normal range of motion.  Neurological: She is alert and oriented to person, place, and time.  Skin: Skin is warm and dry.  Results for ITHA, KROEKER (MRN [id number]) as of 07/14/2015 21:20  Ref. Range 07/10/2015 18:06 07/10/2015 18:40 07/12/2015 18:17 07/14/2015 19:10  HCG, Beta Chain, Quant, S Latest Ref Range: <5 mIU/mL 715 (H)  1089 (H) 1742 (H)  US Ob Transvaginal  07/14/2015   CLINICAL DATA:  Pregnancy of unknown location.  EXAM: TRANSVAGINAL OB ULTRASOUND  TECHNIQUE: Transvaginal ultrasound was performed for complete evaluation of the gestation as well as the maternal uterus, adnexal regions, and pelvic cul-de-sac.  COMPARISON:  07/10/2015  FINDINGS: Intrauterine gestational sac: Visualized.  Yolk sac:  None  Embryo:  None  Cardiac Activity: None  MSD: 3.7  mm   4 w   6  d  Maternal  uterus/adnexae: Small intrauterine gestational sac without yolk sac or embryo. Normal appearance of the left ovary measuring 2.4 x 2.0 x 2.5 cm. The right ovary measures 5.5 x 2.4 x 2.5 cm with probable corpus luteum. Trace free fluid.  IMPRESSION: Intrauterine pregnancy. Calculated gestational age by ultrasound is 4 weeks and 6 days.   Electronically Signed   By: Markus Daft M.D.   On: 07/14/2015 21:07    Signed off to Marcille Buffy, CNM 8pm  Assessment/Plan: 44 y.o. [id number] at [redacted]w[redacted]d wks Pregnancy Follow-up BHCG  1. Pregnancy of unknown anatomic location    DC home Comfort measures reviewed  1st Trimester precautions  Bleeding precautions Ectopic precautions RX: none Return to MAU as needed Repeat US in one week   Follow-up Information    Follow up with [facility].   Specialty:  Radiology   Why:  They will call you with an appointment    Contact information:   [address] [phone] [place] [phone]      Follow up with [facility].   Why:  as needed for emergencies    Contact information:   [address] [phone] [place] [phone]

## 2015-07-15 ENCOUNTER — Encounter: Payer: Self-pay | Admitting: Obstetrics and Gynecology

## 2015-07-25 ENCOUNTER — Encounter: Payer: Medicaid Other | Attending: Obstetrics and Gynecology | Admitting: *Deleted

## 2015-07-25 ENCOUNTER — Ambulatory Visit (HOSPITAL_COMMUNITY)
Admission: RE | Admit: 2015-07-25 | Discharge: 2015-07-25 | Disposition: A | Discharge status: Home / Self Care | Payer: Medicaid Other | Source: Ambulatory Visit | Attending: Advanced Practice Midwife | Admitting: Advanced Practice Midwife

## 2015-07-25 ENCOUNTER — Ambulatory Visit: Payer: Self-pay | Admitting: *Deleted

## 2015-07-25 ENCOUNTER — Inpatient Hospital Stay (HOSPITAL_COMMUNITY)
Admission: AD | Admit: 2015-07-25 | Discharge: 2015-07-25 | Disposition: A | Discharge status: Home / Self Care | Payer: Medicaid Other | Source: Ambulatory Visit | Attending: Family Medicine | Admitting: Family Medicine

## 2015-07-25 DIAGNOSIS — O24111 Pre-existing diabetes mellitus, type 2, in pregnancy, first trimester: Secondary | ICD-10-CM | POA: Diagnosis present

## 2015-07-25 DIAGNOSIS — O0281 Inappropriate change in quantitative human chorionic gonadotropin (hCG) in early pregnancy: Secondary | ICD-10-CM | POA: Diagnosis present

## 2015-07-25 DIAGNOSIS — Z713 Dietary counseling and surveillance: Secondary | ICD-10-CM | POA: Diagnosis not present

## 2015-07-25 DIAGNOSIS — O3680X0 Pregnancy with inconclusive fetal viability, not applicable or unspecified: Secondary | ICD-10-CM

## 2015-07-25 DIAGNOSIS — Z36 Encounter for antenatal screening of mother: Secondary | ICD-10-CM | POA: Diagnosis present

## 2015-07-25 DIAGNOSIS — Z3A01 Less than 8 weeks gestation of pregnancy: Secondary | ICD-10-CM | POA: Diagnosis not present

## 2015-07-25 LAB — CBC
HEMATOCRIT: 33.8 % — AB (ref 36.0–46.0)
Hemoglobin: 10.9 g/dL — ABNORMAL LOW (ref 12.0–15.0)
MCH: 24.6 pg — ABNORMAL LOW (ref 26.0–34.0)
MCHC: 32.2 g/dL (ref 30.0–36.0)
MCV: 76.3 fL — AB (ref 78.0–100.0)
PLATELETS: 351 10*3/uL (ref 150–400)
RBC: 4.43 MIL/uL (ref 3.87–5.11)
RDW: 14.5 % (ref 11.5–15.5)
WBC: 11.8 10*3/uL — AB (ref 4.0–10.5)

## 2015-07-25 LAB — BASIC METABOLIC PANEL
ANION GAP: 8 (ref 5–15)
BUN: 6 mg/dL (ref 6–20)
CO2: 27 mmol/L (ref 22–32)
Calcium: 8.9 mg/dL (ref 8.9–10.3)
Chloride: 100 mmol/L — ABNORMAL LOW (ref 101–111)
Creatinine, Ser: 0.55 mg/dL (ref 0.44–1.00)
GLUCOSE: 161 mg/dL — AB (ref 65–99)
POTASSIUM: 3.8 mmol/L (ref 3.5–5.1)
Sodium: 135 mmol/L (ref 135–145)

## 2015-07-25 LAB — HCG, QUANTITATIVE, PREGNANCY: HCG, BETA CHAIN, QUANT, S: 3899 m[IU]/mL — AB (ref <5)

## 2015-07-25 NOTE — Progress Notes (Signed)
Patient diagnosed with T2DM 09/2014 with A1c of 14%. Her most recent A1c of 7.9% was performed 05/2015. Her T2DM is presently being managed by [facility]. Prior to pregnancy she was being managed of PO meds. Her present regimen is Levemir 22units HS, increase by 2 units every 2 days to a maximum of 28units. Upon reaching 28 units add Novolog 8 units before meals. Her 14d glucose average is 163mg /dl. Today's FBS 134mg /dl. I have provided her with a log book in which she will log all glucose readings. I will see her in 1 week to review glucose readings.

## 2015-07-25 NOTE — MAU Note (Signed)
Pt. Left to get lunch per APP. Pt. Will return in approx. Lynn Moody

## 2015-07-25 NOTE — Discharge Instructions (Signed)
First Trimester of Pregnancy The first trimester of pregnancy is from week 1 until the end of week 12 (months 1 through 3). During this time, your baby will begin to develop inside you. At 6-8 weeks, the eyes and face are formed, and the heartbeat can be seen on ultrasound. At the end of 12 weeks, all the baby's organs are formed. Prenatal care is all the medical care you receive before the birth of your baby. Make sure you get good prenatal care and follow all of your doctor's instructions. HOME CARE  Medicines  Take medicine only as told by your doctor. Some medicines are safe and some are not during pregnancy.  Take your prenatal vitamins as told by your doctor.  Take medicine that helps you poop (stool softener) as needed if your doctor says it is okay. Diet  Eat regular, healthy meals.  Your doctor will tell you the amount of weight gain that is right for you.  Avoid raw meat and uncooked cheese.  If you feel sick to your stomach (nauseous) or throw up (vomit):  Eat 4 or 5 small meals a day instead of 3 large meals.  Try eating a few soda crackers.  Drink liquids between meals instead of during meals.  If you have a hard time pooping (constipation):  Eat high-fiber foods like fresh vegetables, fruit, and whole grains.  Drink enough fluids to keep your pee (urine) clear or pale yellow. Activity and Exercise  Exercise only as told by your doctor. Stop exercising if you have cramps or pain in your lower belly (abdomen) or low back.  Try to avoid standing for long periods of time. Move your legs often if you must stand in one place for a long time.  Avoid heavy lifting.  Wear low-heeled shoes. Sit and stand up straight.  You can have sex unless your doctor tells you not to. Relief of Pain or Discomfort  Wear a good support bra if your breasts are sore.  Take warm water baths (sitz baths) to soothe pain or discomfort caused by hemorrhoids. Use hemorrhoid cream if your  doctor says it is okay.  Rest with your legs raised if you have leg cramps or low back pain.  Wear support hose if you have puffy, bulging veins (varicose veins) in your legs. Raise (elevate) your feet for 15 minutes, 3-4 times a day. Limit salt in your diet. Prenatal Care  Schedule your prenatal visits by the twelfth week of pregnancy.  Write down your questions. Take them to your prenatal visits.  Keep all your prenatal visits as told by your doctor. Safety  Wear your seat belt at all times when driving.  Make a list of emergency phone numbers. The list should include numbers for family, friends, the hospital, and police and fire departments. General Tips  Ask your doctor for a referral to a local prenatal class. Begin classes no later than at the start of month 6 of your pregnancy.  Ask for help if you need counseling or help with nutrition. Your doctor can give you advice or tell you where to go for help.  Do not use hot tubs, steam rooms, or saunas.  Do not douche or use tampons or scented sanitary pads.  Do not cross your legs for long periods of time.  Avoid litter boxes and soil used by cats.  Avoid all smoking, herbs, and alcohol. Avoid drugs not approved by your doctor.  Visit your dentist. At home, brush your teeth   with a soft toothbrush. Be gentle when you floss. GET HELP IF:  You are dizzy.  You have mild cramps or pressure in your lower belly.  You have a nagging pain in your belly area.  You continue to feel sick to your stomach, throw up, or have watery poop (diarrhea).  You have a bad smelling fluid coming from your vagina.  You have pain with peeing (urination).  You have increased puffiness (swelling) in your face, hands, legs, or ankles. GET HELP RIGHT AWAY IF:   You have a fever.  You are leaking fluid from your vagina.  You have spotting or bleeding from your vagina.  You have very bad belly cramping or pain.  You gain or lose weight  rapidly.  You throw up blood. It may look like coffee grounds.  You are around people who have German measles, fifth disease, or chickenpox.  You have a very bad headache.  You have shortness of breath.  You have any kind of trauma, such as from a fall or a car accident. Document Released: 04/30/2008 Document Revised: 03/29/2014 Document Reviewed: 09/22/2013 ExitCare Patient Information 2015 [employer]. This information is not intended to replace advice given to you by your health care provider. Make sure you discuss any questions you have with your health care provider.  

## 2015-07-25 NOTE — Progress Notes (Signed)
Nutrition Note: DM diet education Pt has history of obesity,T2DM since 2015.  Pt has lost 6.3# @ [redacted]w[redacted]d because pt reports healthy eating changes.  Pt reports eating 3 meals, 1 snack daily.  Pt is taking a PNV.  Pt is taking insulin.  Pt reports no N & V or heartburn. NKFA.  Pt received verbal and written education on DM during pregnancy.  Discussed weight gain goals of 11-20# or 0.5 lbs per week.  Pt agrees to continue to take PNV.  Pt has [place] and plans to BF.  F/u in 2-4 weeks.  Avel Peace, MS, RD,LDN

## 2015-07-25 NOTE — MAU Provider Note (Signed)
[name] is a 44 y.o. [id number] at [redacted]w[redacted]d who presents to MAU today for follow-up US results. She denies abdominal pain, vaginal bleeding, N/V or fever today.   LMP 05/29/2015  GENERAL: Well-developed, well-nourished female in no acute distress.  HEENT: Normocephalic, atraumatic.   LUNGS: Effort normal HEART: Regular rate  SKIN: Warm, dry and without erythema PSYCH: Normal mood and affect  US Ob Transvaginal  07/25/2015   CLINICAL DATA:  Follow viability.  EXAM: TRANSVAGINAL OB ULTRASOUND  TECHNIQUE: Transvaginal ultrasound was performed for complete evaluation of the gestation as well as the maternal uterus, adnexal regions, and pelvic cul-de-sac.  COMPARISON:  Pelvic ultrasound 07/10/2015 ; 07/14/2015  FINDINGS: Intrauterine gestational sac: Present  Yolk sac:  Not present  Embryo:  Not present  Cardiac Activity: Not present  MSD: 7.2  mm   5 w   3  d  Maternal uterus/adnexae: Normal right and left ovaries. No subchorionic hemorrhage. No free fluid in the pelvis.  IMPRESSION: Intrauterine gestational sac without yolk sac. Findings are suspicious but not yet definitive for failed pregnancy. Recommend follow-up US in 10-14 days for definitive diagnosis. This recommendation follows SRU consensus guidelines: Diagnostic Criteria for Nonviable Pregnancy Early in the First Trimester. [employer] Med 2013; 092:3300-76.   Electronically Signed   By: Lovey Newcomer M.D.   On: 07/25/2015 12:25   MDM Discussed with Dr. Nehemiah Settle. Patient may have the option to wait 10 days for another [place] or can be counseled on Cytotec today Discussed options with patient. Patient counseled on high likelihood of non-viable pregnancy. She is not comfortable with Cytotec at this time and would like to schedule [place] for 10 days.   A: IUGS at [redacted]w[redacted]d without development of YS after > 10 days and inappropriate interval of growth since last [place]  P: Discharge home Order placed for follow-up US in 10 days. They will call the patient  with an appointment time.  Bleeding/ectopic precautions discussed Patient may return to MAU as needed or if her condition were to change or worsen   Luvenia Redden, PA-C  07/25/2015 1:10 PM

## 2015-07-26 ENCOUNTER — Encounter: Payer: Self-pay | Admitting: Obstetrics and Gynecology

## 2015-07-29 ENCOUNTER — Telehealth: Payer: Self-pay | Admitting: *Deleted

## 2015-07-29 NOTE — Telephone Encounter (Addendum)
Pt left message stating that she now has Medicaid and needs a prescription for a new glucose meter and testing supplies. Medicaid does not cover her current meter (Contour).  She also states that she spoke with the pharmacy and was told that medicaid does not cover ANY glucose meters but does cover the supplies. I called and spoke with the pharmacist who verified the pt's information. She will call Medicaid to check in to it and call me back.    [id number]  Received call back from [facility] pharmacist who confirmed that Medicaid will not pay for meter but will pay for supplies. They are obtaining a free meter (Accu-chek Aviva Plus) for pt and have received orders today from her other doctor for the correct testing supplies for that meter.  I notified pt of all this information and and advised her to call the pharmacy before going to pick up her meter and supplies to be sure that it is all available.  She voiced understanding.

## 2015-08-03 ENCOUNTER — Inpatient Hospital Stay (HOSPITAL_COMMUNITY): Payer: Medicaid Other

## 2015-08-03 ENCOUNTER — Encounter (HOSPITAL_COMMUNITY): Payer: Self-pay | Admitting: *Deleted

## 2015-08-03 ENCOUNTER — Inpatient Hospital Stay (HOSPITAL_COMMUNITY)
Admission: AD | Admit: 2015-08-03 | Discharge: 2015-08-03 | Disposition: A | Discharge status: Home / Self Care | Payer: Medicaid Other | Source: Ambulatory Visit | Attending: Family Medicine | Admitting: Family Medicine

## 2015-08-03 DIAGNOSIS — O034 Incomplete spontaneous abortion without complication: Secondary | ICD-10-CM | POA: Insufficient documentation

## 2015-08-03 DIAGNOSIS — Z3A09 9 weeks gestation of pregnancy: Secondary | ICD-10-CM | POA: Insufficient documentation

## 2015-08-03 DIAGNOSIS — O4691 Antepartum hemorrhage, unspecified, first trimester: Secondary | ICD-10-CM | POA: Diagnosis not present

## 2015-08-03 DIAGNOSIS — O209 Hemorrhage in early pregnancy, unspecified: Secondary | ICD-10-CM | POA: Diagnosis present

## 2015-08-03 HISTORY — DX: Anemia, unspecified: D64.9

## 2015-08-03 LAB — CBC
HCT: 33.7 % — ABNORMAL LOW (ref 36.0–46.0)
HEMOGLOBIN: 11.1 g/dL — AB (ref 12.0–15.0)
MCH: 25.1 pg — AB (ref 26.0–34.0)
MCHC: 32.9 g/dL (ref 30.0–36.0)
MCV: 76.2 fL — AB (ref 78.0–100.0)
Platelets: 357 10*3/uL (ref 150–400)
RBC: 4.42 MIL/uL (ref 3.87–5.11)
RDW: 14.6 % (ref 11.5–15.5)
WBC: 13.1 10*3/uL — AB (ref 4.0–10.5)

## 2015-08-03 LAB — URINE MICROSCOPIC-ADD ON

## 2015-08-03 LAB — URINALYSIS, ROUTINE W REFLEX MICROSCOPIC
Bilirubin Urine: NEGATIVE
Glucose, UA: NEGATIVE mg/dL
Ketones, ur: NEGATIVE mg/dL
LEUKOCYTES UA: NEGATIVE
NITRITE: NEGATIVE
PH: 6 (ref 5.0–8.0)
Protein, ur: NEGATIVE mg/dL
SPECIFIC GRAVITY, URINE: 1.025 (ref 1.005–1.030)
UROBILINOGEN UA: 0.2 mg/dL (ref 0.0–1.0)

## 2015-08-03 LAB — HCG, QUANTITATIVE, PREGNANCY: HCG, BETA CHAIN, QUANT, S: 2300 m[IU]/mL — AB (ref <5)

## 2015-08-03 MED ORDER — IBUPROFEN 600 MG PO TABS: 600.0000 mg | ORAL_TABLET | Freq: Four times a day (QID) | ORAL | Status: DC | PRN

## 2015-08-03 MED ORDER — OXYCODONE-ACETAMINOPHEN 5-325 MG PO TABS: 1.0000 | ORAL_TABLET | ORAL | Status: DC | PRN

## 2015-08-03 NOTE — Discharge Instructions (Signed)
Miscarriage A miscarriage is the sudden loss of an unborn baby (fetus) before the 20th week of pregnancy. Most miscarriages happen in the first 3 months of pregnancy. Sometimes, it happens before a woman even knows she is pregnant. A miscarriage is also called a "spontaneous miscarriage" or "early pregnancy loss." Having a miscarriage can be an emotional experience. Talk with your caregiver about any questions you may have about miscarrying, the grieving process, and your future pregnancy plans. CAUSES   Problems with the fetal chromosomes that make it impossible for the baby to develop normally. Problems with the baby's genes or chromosomes are most often the result of errors that occur, by chance, as the embryo divides and grows. The problems are not inherited from the parents.  Infection of the cervix or uterus.   Hormone problems.   Problems with the cervix, such as having an incompetent cervix. This is when the tissue in the cervix is not strong enough to hold the pregnancy.   Problems with the uterus, such as an abnormally shaped uterus, uterine fibroids, or congenital abnormalities.   Certain medical conditions.   Smoking, drinking alcohol, or taking illegal drugs.   Trauma.  Often, the cause of a miscarriage is unknown.  SYMPTOMS   Vaginal bleeding or spotting, with or without cramps or pain.  Pain or cramping in the abdomen or lower back.  Passing fluid, tissue, or blood clots from the vagina. DIAGNOSIS  Your caregiver will perform a physical exam. You may also have an ultrasound to confirm the miscarriage. Blood or urine tests may also be ordered. TREATMENT   Sometimes, treatment is not necessary if you naturally pass all the fetal tissue that was in the uterus. If some of the fetus or placenta remains in the body (incomplete miscarriage), tissue left behind may become infected and must be removed. Usually, a dilation and curettage (D and C) procedure is performed.  During a D and C procedure, the cervix is widened (dilated) and any remaining fetal or placental tissue is gently removed from the uterus.  Antibiotic medicines are prescribed if there is an infection. Other medicines may be given to reduce the size of the uterus (contract) if there is a lot of bleeding.  If you have Rh negative blood and your baby was Rh positive, you will need a Rh immunoglobulin shot. This shot will protect any future baby from having Rh blood problems in future pregnancies. HOME CARE INSTRUCTIONS   Your caregiver may order bed rest or may allow you to continue light activity. Resume activity as directed by your caregiver.  Have someone help with home and family responsibilities during this time.   Keep track of the number of sanitary pads you use each day and how soaked (saturated) they are. Write down this information.   Do not use tampons. Do not douche or have sexual intercourse until approved by your caregiver.   Only take over-the-counter or prescription medicines for pain or discomfort as directed by your caregiver.   Do not take aspirin. Aspirin can cause bleeding.   Keep all follow-up appointments with your caregiver.   If you or your partner have problems with grieving, talk to your caregiver or seek counseling to help cope with the pregnancy loss. Allow enough time to grieve before trying to get pregnant again.  SEEK IMMEDIATE MEDICAL CARE IF:   You have severe cramps or pain in your back or abdomen.  You have a fever.  You pass large blood clots (walnut-sized   or larger) ortissue from your vagina. Save any tissue for your caregiver to inspect.   Your bleeding increases.   You have a thick, bad-smelling vaginal discharge.  You become lightheaded, weak, or you faint.   You have chills.  MAKE SURE YOU:  Understand these instructions.  Will watch your condition.  Will get help right away if you are not doing well or get  worse. Document Released: 05/08/2001 Document Revised: 03/09/2013 Document Reviewed: 01/01/2012 ExitCare Patient Information 2015 [employer]. This information is not intended to replace advice given to you by your health care provider. Make sure you discuss any questions you have with your health care provider.  

## 2015-08-03 NOTE — MAU Provider Note (Signed)
History     CSN: [id number]  Arrival date and time: 08/03/15 1857   First Provider Initiated Contact with Patient 08/03/15 1945      Chief Complaint  Patient presents with  . Vaginal Bleeding   HPI Lynn Moody is a 44 y.o. [id number] at [redacted]w[redacted]d who presents for vaginal bleeding.  Previously offered cytotec for failed pregnancy but opted for expectant management.  Small amount of dark bleeding since Monday.  Bright red spotting since yesterday.  Denies abdominal pain.    OB History    Gravida Para Term Preterm AB TAB SAB Ectopic Multiple Living   7 4   2  2   4       Past Medical History  Diagnosis Date  . Diabetes mellitus without complication   . Anemia     Past Surgical History  Procedure Laterality Date  . Cesarean section    . Dental surgery    . Breast biopsy      left breast with maker placed    History reviewed. No pertinent family history.  Social History  Substance Use Topics  . Smoking status: Never Smoker   . Smokeless tobacco: None  . Alcohol Use: No    Allergies: No Known Allergies  Prescriptions prior to admission  Medication Sig Dispense Refill Last Dose  . insulin detemir (LEVEMIR) 100 UNIT/ML injection Inject 11 Units into the skin at bedtime.   07/09/2015 at Unknown time  . Prenatal Vit-Fe Fumarate-FA (PRENATAL MULTIVITAMIN) TABS tablet Take 1 tablet by mouth daily.   07/10/2015 at Unknown time    Review of Systems  Constitutional: Negative.  Negative for fever.  Respiratory: Negative.   Cardiovascular: Negative.   Gastrointestinal: Negative for nausea, vomiting and abdominal pain.  Genitourinary: Negative for dysuria.       +vaginal bleeding   Physical Exam   Blood pressure 119/83, pulse 59, temperature 98.6 F (37 C), temperature source Oral, resp. rate 16, height 5\' 4"  (1.626 m), weight 87.544 kg (193 lb), last menstrual period 05/29/2015, SpO2 99 %.  Physical Exam  Nursing note and vitals reviewed. Constitutional: She is  oriented to person, place, and time. She appears well-developed and well-nourished. No distress.  HENT:  Head: Normocephalic and atraumatic.  Eyes: Conjunctivae are normal. Right eye exhibits no discharge. Left eye exhibits no discharge. No scleral icterus.  Neck: Normal range of motion.  Cardiovascular: Normal rate, regular rhythm and normal heart sounds.   No murmur heard. Respiratory: Effort normal and breath sounds normal. No respiratory distress. She has no wheezes.  GI: Soft.  Neurological: She is alert and oriented to person, place, and time.  Skin: Skin is warm and dry. She is not diaphoretic.  Psychiatric: She has a normal mood and affect. Her behavior is normal. Judgment and thought content normal.    MAU Course  Procedures Results for orders placed or performed during the hospital encounter of 08/03/15 (from the past 24 hour(s))  Urinalysis, Routine w reflex microscopic (not at [facility])     Status: Abnormal   Collection Time: 08/03/15  7:08 PM  Result Value Ref Range   Color, Urine YELLOW YELLOW   APPearance CLEAR CLEAR   Specific Gravity, Urine 1.025 1.005 - 1.030   pH 6.0 5.0 - 8.0   Glucose, UA NEGATIVE NEGATIVE mg/dL   Hgb urine dipstick LARGE (A) NEGATIVE   Bilirubin Urine NEGATIVE NEGATIVE   Ketones, ur NEGATIVE NEGATIVE mg/dL   Protein, ur NEGATIVE NEGATIVE mg/dL   Urobilinogen, UA  0.2 0.0 - 1.0 mg/dL   Nitrite NEGATIVE NEGATIVE   Leukocytes, UA NEGATIVE NEGATIVE  Urine microscopic-add on     Status: None   Collection Time: 08/03/15  7:08 PM  Result Value Ref Range   Squamous Epithelial / LPF RARE RARE   WBC, UA 0-2 <3 WBC/hpf   RBC / HPF 21-50 <3 RBC/hpf   Bacteria, UA RARE RARE  CBC     Status: Abnormal   Collection Time: 08/03/15  7:31 PM  Result Value Ref Range   WBC 13.1 (H) 4.0 - 10.5 K/uL   RBC 4.42 3.87 - 5.11 MIL/uL   Hemoglobin 11.1 (L) 12.0 - 15.0 g/dL   HCT 33.7 (L) 36.0 - 46.0 %   MCV 76.2 (L) 78.0 - 100.0 fL   MCH 25.1 (L) 26.0 - 34.0 pg    MCHC 32.9 30.0 - 36.0 g/dL   RDW 14.6 11.5 - 15.5 %   Platelets 357 150 - 400 K/uL  hCG, quantitative, pregnancy     Status: Abnormal   Collection Time: 08/03/15  7:31 PM  Result Value Ref Range   hCG, Beta Chain, Quant, S 2300 (H) <5 mIU/mL   US Ob Transvaginal  08/03/2015   CLINICAL DATA:  44 year old pregnant female with vaginal bleeding x3 days.  EXAM: OBSTETRIC <14 WK [place] AND TRANSVAGINAL OB US  TECHNIQUE: Both transabdominal and transvaginal ultrasound examinations were performed for complete evaluation of the gestation as well as the maternal uterus, adnexal regions, and pelvic cul-de-sac. Transvaginal technique was performed to assess early pregnancy.  COMPARISON:  Ultrasound dated 07/25/2015  FINDINGS: The uterus is retroverted. There is a cystic structure within the endometrial canal. No yolk sac or fetal pole identified. This may represent an early gestational sac, or a blighted ovum. A pseudo gestation of an ectopic pregnancy is not entirely excluded. Correlation with clinical exam and follow-up with serial HCG levels and ultrasound recommended.  If this cystic structure is a true gestational sac, the gestational age based on mean sac diameter of 8 mm is 5 weeks, 4 days.  A small hypoechoic collection adjacent to this cystic structure may represent a subchorionic hemorrhage.  The maternal ovaries are unremarkable.  IMPRESSION: Intrauterine cystic structure as described. Correlation with serial HCG levels and follow-up with ultrasound recommended.   Electronically Signed   By: Anner Crete M.D.   On: 08/03/2015 20:34    MDM Patient offered cytotec. Declines. Wants expectant management. Will send message for f/u in clinic in 2 weeks.   Assessment and Plan  A: 1. Vaginal bleeding in pregnancy, first trimester   2. Incomplete miscarriage    P: Discharge home Discussed reasons to return to MAU F/u in 2 weeks in [facility] Rx for ibuprofen & [place], NP   08/03/2015, 7:45 PM

## 2015-08-03 NOTE — MAU Note (Signed)
Pt reports some blood on tissue when she wipes x 3 days, states she has some pressure but that has been happening.

## 2015-08-04 ENCOUNTER — Ambulatory Visit (HOSPITAL_COMMUNITY): Payer: Self-pay

## 2015-08-08 ENCOUNTER — Encounter: Payer: Medicaid Other | Admitting: Obstetrics and Gynecology

## 2015-08-08 ENCOUNTER — Telehealth: Payer: Self-pay | Admitting: *Deleted

## 2015-08-08 ENCOUNTER — Encounter: Payer: Self-pay | Admitting: Obstetrics and Gynecology

## 2015-08-08 ENCOUNTER — Ambulatory Visit (HOSPITAL_COMMUNITY): Admission: RE | Admit: 2015-08-08 | Payer: Medicaid Other | Source: Ambulatory Visit

## 2015-08-08 NOTE — Telephone Encounter (Signed)
Pt called and left message requesting that a nurse call her back to discuss a concern she has. She wanted to know if she needs to be seen sooner than her appointment on 9/22. Pt states that she is having a miscarriage and has been bleeding and passing tissue. i advised that this is something that we would expect. Patient advised to keep upcoming appointment and call us sooner is she has severe pain, foul discharge or fever. Patient had no further questions or concerns.

## 2015-08-18 ENCOUNTER — Encounter: Payer: Medicaid Other | Admitting: Family Medicine

## 2015-08-19 ENCOUNTER — Ambulatory Visit (INDEPENDENT_AMBULATORY_CARE_PROVIDER_SITE_OTHER): Payer: Medicaid Other | Admitting: Student

## 2015-08-19 ENCOUNTER — Encounter: Payer: Self-pay | Admitting: Student

## 2015-08-19 VITALS — BP 133/79 | HR 69 | Temp 98.2°F | Ht 64.0 in | Wt 191.0 lb

## 2015-08-19 DIAGNOSIS — Z8742 Personal history of other diseases of the female genital tract: Secondary | ICD-10-CM

## 2015-08-19 DIAGNOSIS — O039 Complete or unspecified spontaneous abortion without complication: Secondary | ICD-10-CM | POA: Diagnosis present

## 2015-08-19 DIAGNOSIS — Z8759 Personal history of other complications of pregnancy, childbirth and the puerperium: Secondary | ICD-10-CM

## 2015-08-19 NOTE — Patient Instructions (Signed)
Miscarriage A miscarriage is the sudden loss of an unborn baby (fetus) before the 20th week of pregnancy. Most miscarriages happen in the first 3 months of pregnancy. Sometimes, it happens before a woman even knows she is pregnant. A miscarriage is also called a "spontaneous miscarriage" or "early pregnancy loss." Having a miscarriage can be an emotional experience. Talk with your caregiver about any questions you may have about miscarrying, the grieving process, and your future pregnancy plans. CAUSES   Problems with the fetal chromosomes that make it impossible for the baby to develop normally. Problems with the baby's genes or chromosomes are most often the result of errors that occur, by chance, as the embryo divides and grows. The problems are not inherited from the parents.  Infection of the cervix or uterus.   Hormone problems.   Problems with the cervix, such as having an incompetent cervix. This is when the tissue in the cervix is not strong enough to hold the pregnancy.   Problems with the uterus, such as an abnormally shaped uterus, uterine fibroids, or congenital abnormalities.   Certain medical conditions.   Smoking, drinking alcohol, or taking illegal drugs.   Trauma.  Often, the cause of a miscarriage is unknown.  SYMPTOMS   Vaginal bleeding or spotting, with or without cramps or pain.  Pain or cramping in the abdomen or lower back.  Passing fluid, tissue, or blood clots from the vagina. DIAGNOSIS  Your caregiver will perform a physical exam. You may also have an ultrasound to confirm the miscarriage. Blood or urine tests may also be ordered. TREATMENT   Sometimes, treatment is not necessary if you naturally pass all the fetal tissue that was in the uterus. If some of the fetus or placenta remains in the body (incomplete miscarriage), tissue left behind may become infected and must be removed. Usually, a dilation and curettage (D and C) procedure is performed.  During a D and C procedure, the cervix is widened (dilated) and any remaining fetal or placental tissue is gently removed from the uterus.  Antibiotic medicines are prescribed if there is an infection. Other medicines may be given to reduce the size of the uterus (contract) if there is a lot of bleeding.  If you have Rh negative blood and your baby was Rh positive, you will need a Rh immunoglobulin shot. This shot will protect any future baby from having Rh blood problems in future pregnancies. HOME CARE INSTRUCTIONS   Your caregiver may order bed rest or may allow you to continue light activity. Resume activity as directed by your caregiver.  Have someone help with home and family responsibilities during this time.   Keep track of the number of sanitary pads you use each day and how soaked (saturated) they are. Write down this information.   Do not use tampons. Do not douche or have sexual intercourse until approved by your caregiver.   Only take over-the-counter or prescription medicines for pain or discomfort as directed by your caregiver.   Do not take aspirin. Aspirin can cause bleeding.   Keep all follow-up appointments with your caregiver.   If you or your partner have problems with grieving, talk to your caregiver or seek counseling to help cope with the pregnancy loss. Allow enough time to grieve before trying to get pregnant again.  SEEK IMMEDIATE MEDICAL CARE IF:   You have severe cramps or pain in your back or abdomen.  You have a fever.  You pass large blood clots (walnut-sized   or larger) ortissue from your vagina. Save any tissue for your caregiver to inspect.   Your bleeding increases.   You have a thick, bad-smelling vaginal discharge.  You become lightheaded, weak, or you faint.   You have chills.  MAKE SURE YOU:  Understand these instructions.  Will watch your condition.  Will get help right away if you are not doing well or get  worse. Document Released: 05/08/2001 Document Revised: 03/09/2013 Document Reviewed: 01/01/2012 ExitCare Patient Information 2015 [employer]. This information is not intended to replace advice given to you by your health care provider. Make sure you discuss any questions you have with your health care provider.  

## 2015-08-19 NOTE — Progress Notes (Signed)
Subjective:     Patient ID: Lynn Moody, female   DOB: [birth year], 44 y.o.   MRN: [id number]  HPI  Lynn Moody is a 44 y.o. [id number] who presents for 2 week f/u after miscarriage. Patient opted for expectant management. States a few weeks ago had increase in vaginal bleeding and then it stopped. No vaginal bleeding or abdominal pain in 1.5 weeks.  Review of Systems  Constitutional: negative for fever Gastrointestinal: No abdominal pain or n/v/d Genitourinary: No vaginal bleeding. Small amount of clear vaginal discharge. No irritation or odor Psychiatric: Patient states she believes she is responding well emotionally to loss.     Objective:   Physical Exam BP 133/79 mmHg  Pulse 69  Temp(Src) 98.2 F (36.8 C)  Ht 5\' 4"  (1.626 m)  Wt 191 lb (86.637 kg)  BMI 32.77 kg/m2  LMP 05/29/2015  Breastfeeding? Unknown  Constitutional: Oriented to person, place & time. Appears well developed and well nourishes. No apparent distress.  Head: normocephalic & atraumatic Neck: normal ROM. No thyromegaly. Neck supple.  CV: normal rate & rhythm. No murmur or extra sounds Resp: Effort normal. Breath sounds clear bilaterally.  GI: bowel sounds present x 4. Abdomen soft & nontender.  Skin: Skin is warm & dry. Not diaphoretic Psychiatric: Normal mood & affect. Behavior, judgment & thought content normal .     Assessment:     1. Miscarriage   -BHCG collected today      Plan:     BHCG collected today to ensure proper drop. Patient receives routine gyn care in [facility] Wishes to resume care with them.  Patient desires to use condoms at this time for birth control.    Jorje Guild, NP

## 2015-08-20 LAB — HCG, QUANTITATIVE, PREGNANCY: hCG, Beta Chain, Quant, S: 3.3 m[IU]/mL

## 2015-08-23 ENCOUNTER — Telehealth: Payer: Self-pay | Admitting: General Practice

## 2015-08-23 NOTE — Telephone Encounter (Signed)
Per Jorje Guild, patient's hcg has returned to normal and requires no further follow up. Patient can return to gyn in [place] for routine care. Called patient and informed her of results and recommendations. Patient verbalized understanding and had no questions

## 2015-09-04 IMAGING — US US OB TRANSVAGINAL
2 series · 15 of 28 positions shown · non-contrast
Comparison: Pelvic ultrasound 07/10/2015 ; 07/14/2015

CLINICAL DATA: Follow viability.

EXAM:
TRANSVAGINAL OB ULTRASOUND
TECHNIQUE: Transvaginal ultrasound was performed for complete evaluation of the
gestation as well as the maternal uterus, adnexal regions, and
pelvic cul-de-sac.

[Series 1: us ob transvaginal · 13 of 27 slices shown (1 of 2)]
[im 1/27]
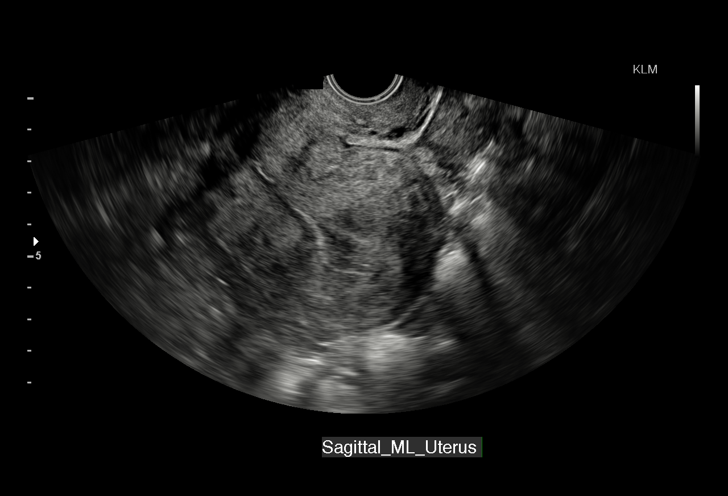
[im 3/27]
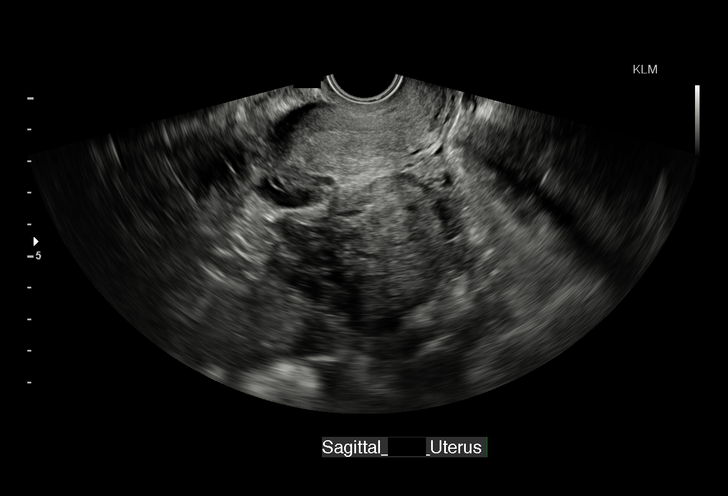
[im 5/27]
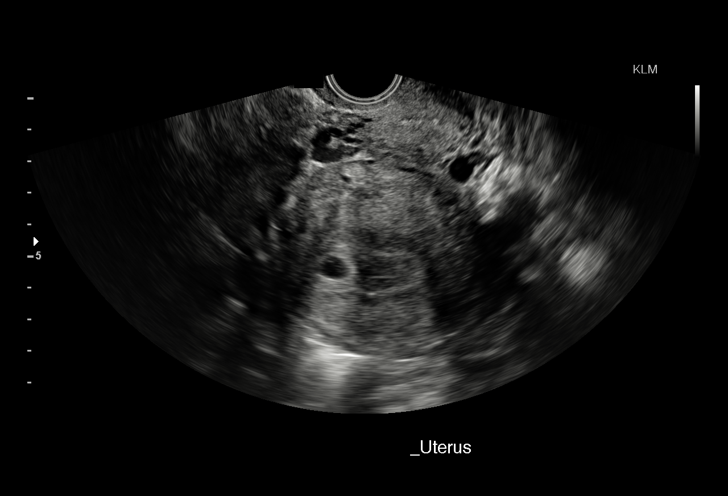
[im 7/27]
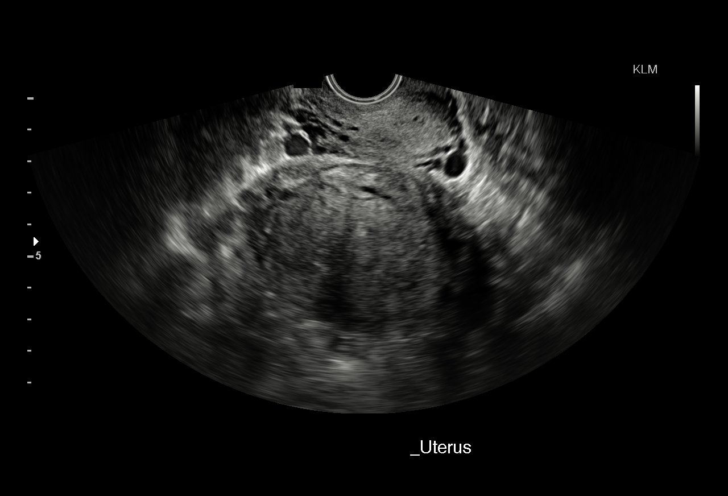
[im 10/27]
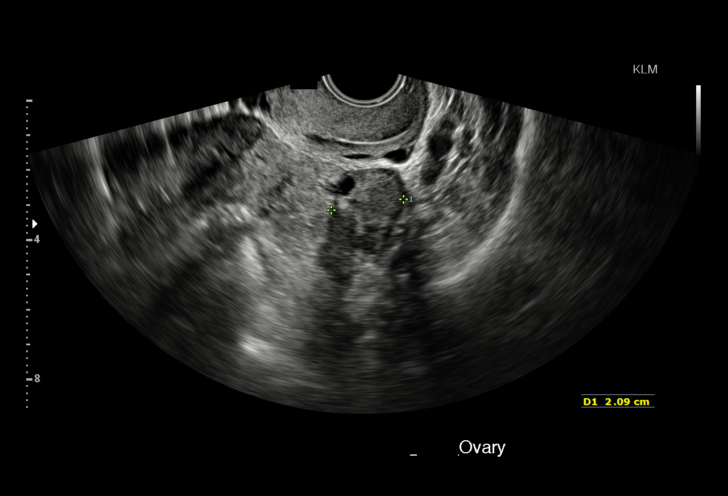
[im 12/27]
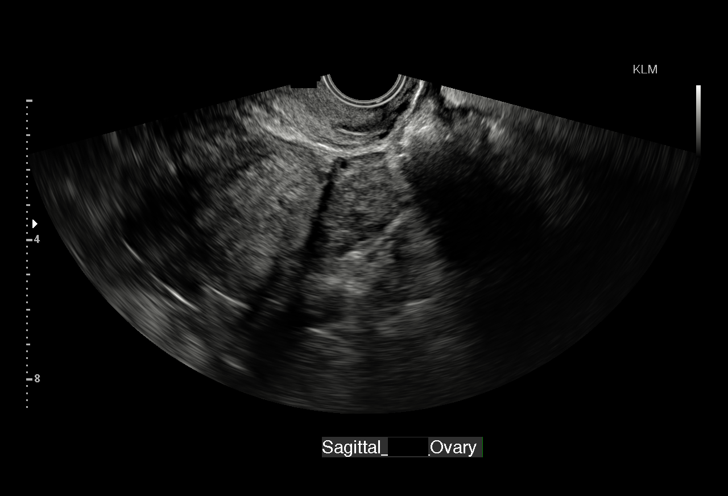
[im 14/27]
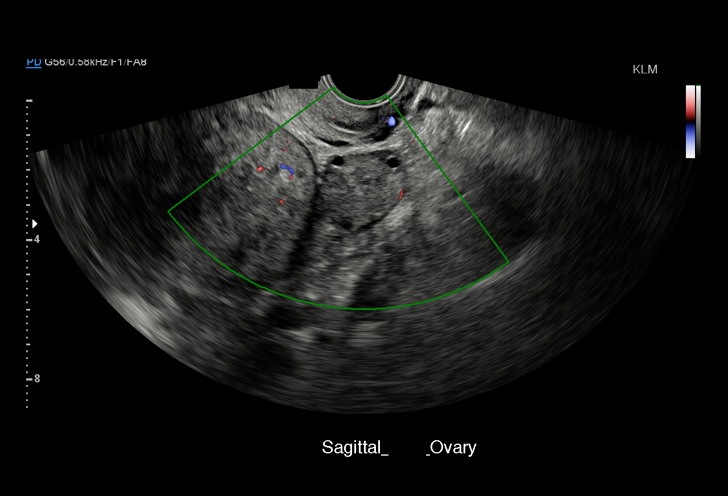
[im 16/27]
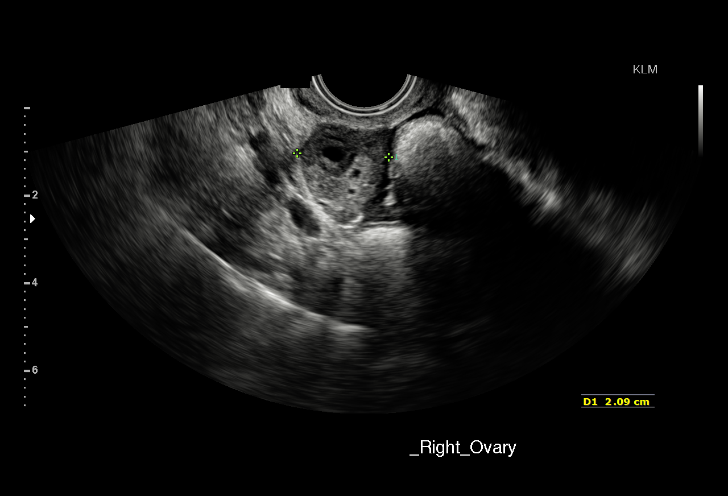
[im 17/27]
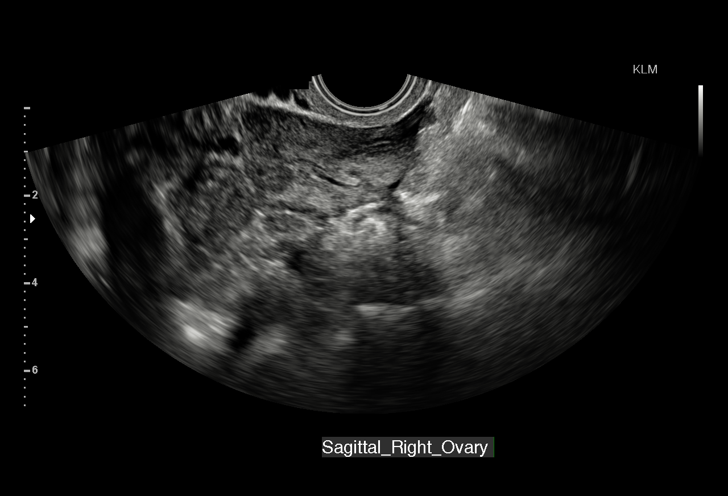
[im 20/27]
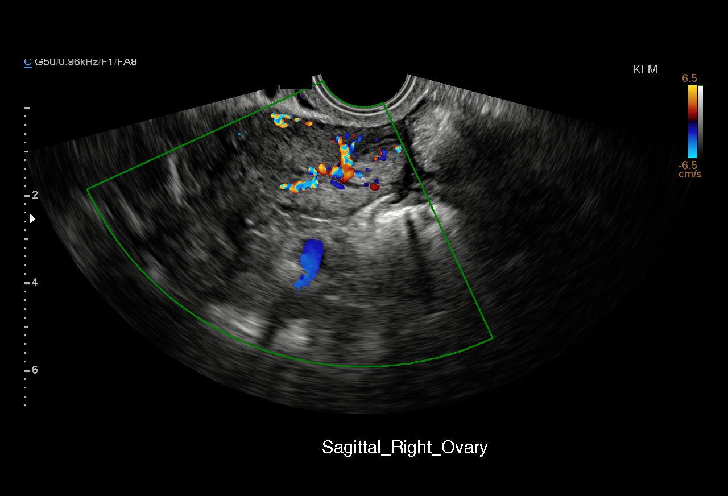
[im 22/27]
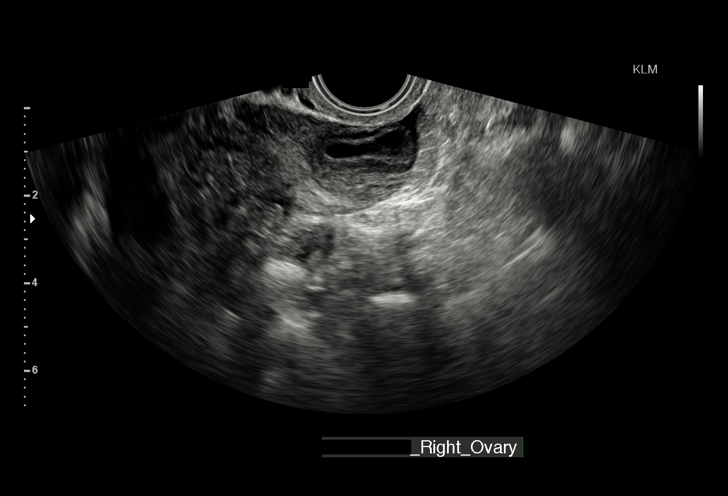
[im 24/27]
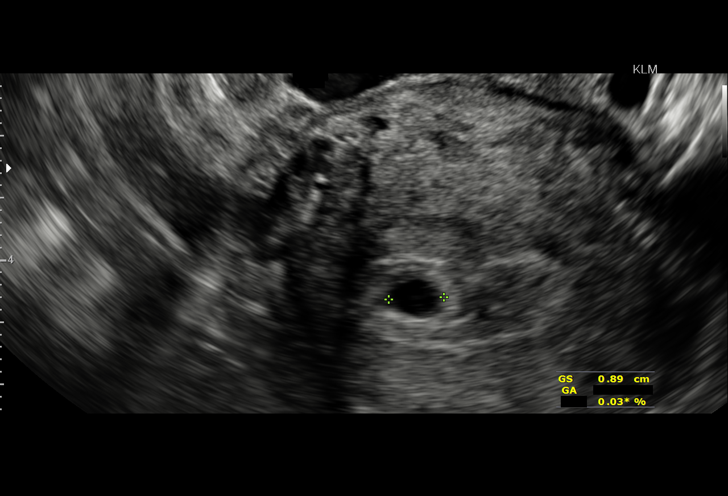
[im 27/27]
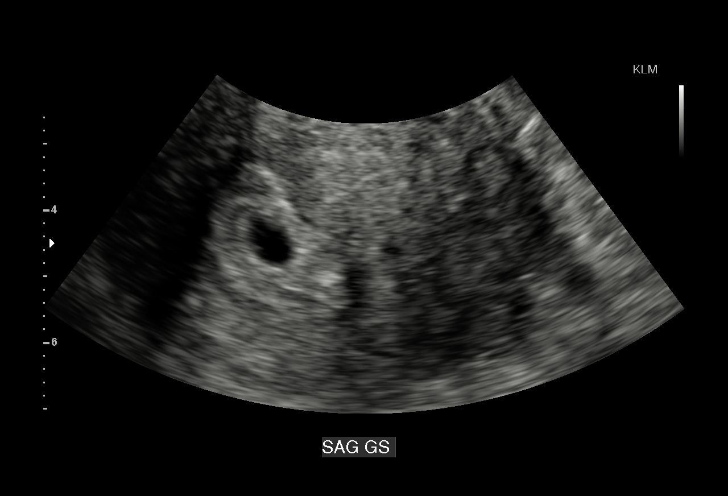

[Series 3: us ob transvaginal · 2 of 5 slices shown (2 of 2)]
[im 2/5]
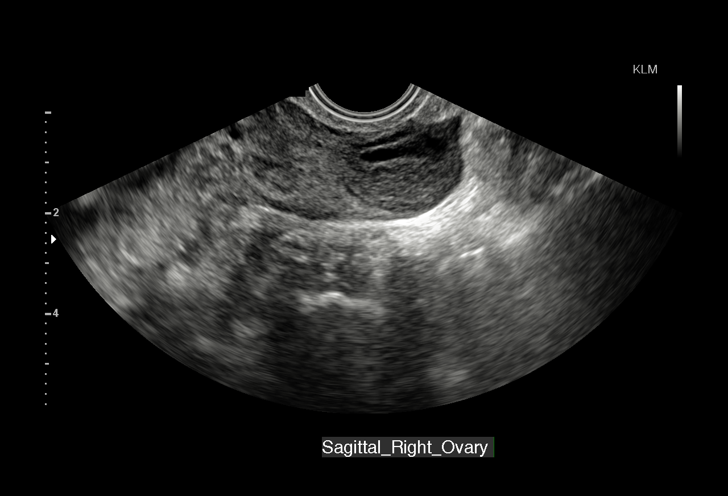
[im 5/5]
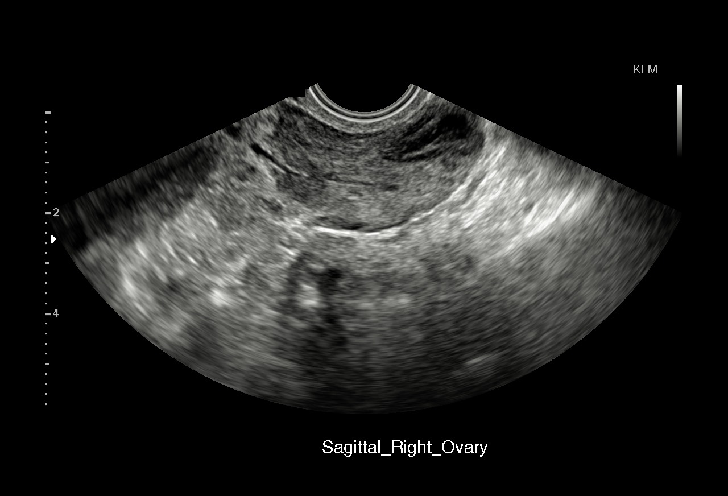

[15 of 28 positions shown; findings below may reference images not displayed]

FINDINGS: Intrauterine gestational sac: Present

Yolk sac:  Not present

Embryo:  Not present

Cardiac Activity: Not present

MSD: 7.2  mm   5 w   3  d

Maternal uterus/adnexae: Normal right and left ovaries. No
subchorionic hemorrhage. No free fluid in the pelvis.
IMPRESSION: Intrauterine gestational sac without yolk sac. Findings are
suspicious but not yet definitive for failed pregnancy. Recommend
follow-up US in 10-14 days for definitive diagnosis. This
recommendation follows SRU consensus guidelines: Diagnostic Criteria
for Nonviable Pregnancy Early in the First Trimester. N Engl J Med

## 2015-09-19 ENCOUNTER — Encounter: Payer: Medicaid Other | Admitting: Obstetrics and Gynecology

## 2015-10-27 ENCOUNTER — Telehealth: Payer: Self-pay | Admitting: *Deleted

## 2015-10-27 NOTE — Telephone Encounter (Signed)
Notified by Lynn Moody that Lynn Moody has an appointment scheduled for next week for follow up after miscarriage. Per Lynn Moody she has already been seen and does not need this appointment unless she is having any issues.   Called Lynn Moody and informed her about the appointment which she knew about, we discussed she had been seen for follow up after miscarriage and did not need any follow up unless she is having any issues / problems and needs to be seen. She denies any issues and problems and voices understanding appointment will be cancelled and she can call if she needs an appointment .

## 2015-11-01 ENCOUNTER — Ambulatory Visit: Payer: Medicaid Other | Admitting: Student

## 2015-11-04 ENCOUNTER — Encounter: Payer: Self-pay | Admitting: *Deleted

## 2015-11-24 ENCOUNTER — Ambulatory Visit: Payer: Medicaid Other | Admitting: Family Medicine

## 2020-11-11 ENCOUNTER — Ambulatory Visit: Payer: Self-pay

## 2020-12-01 ENCOUNTER — Ambulatory Visit: Payer: Medicaid Other

## 2021-05-08 ENCOUNTER — Emergency Department (HOSPITAL_COMMUNITY)
Admission: EM | Admit: 2021-05-08 | Discharge: 2021-05-08 | Disposition: A | Discharge status: Home / Self Care | Payer: Medicaid Other | Attending: Emergency Medicine | Admitting: Emergency Medicine

## 2021-05-08 ENCOUNTER — Emergency Department (HOSPITAL_COMMUNITY): Payer: Medicaid Other

## 2021-05-08 ENCOUNTER — Encounter (HOSPITAL_BASED_OUTPATIENT_CLINIC_OR_DEPARTMENT_OTHER): Payer: Self-pay | Admitting: Orthopaedic Surgery

## 2021-05-08 ENCOUNTER — Other Ambulatory Visit: Payer: Self-pay

## 2021-05-08 DIAGNOSIS — E119 Type 2 diabetes mellitus without complications: Secondary | ICD-10-CM | POA: Insufficient documentation

## 2021-05-08 DIAGNOSIS — Y92009 Unspecified place in unspecified non-institutional (private) residence as the place of occurrence of the external cause: Secondary | ICD-10-CM | POA: Diagnosis not present

## 2021-05-08 DIAGNOSIS — W01198A Fall on same level from slipping, tripping and stumbling with subsequent striking against other object, initial encounter: Secondary | ICD-10-CM | POA: Diagnosis not present

## 2021-05-08 DIAGNOSIS — Y9301 Activity, walking, marching and hiking: Secondary | ICD-10-CM | POA: Insufficient documentation

## 2021-05-08 DIAGNOSIS — Z794 Long term (current) use of insulin: Secondary | ICD-10-CM | POA: Insufficient documentation

## 2021-05-08 DIAGNOSIS — S6992XA Unspecified injury of left wrist, hand and finger(s), initial encounter: Secondary | ICD-10-CM | POA: Diagnosis present

## 2021-05-08 DIAGNOSIS — W19XXXA Unspecified fall, initial encounter: Secondary | ICD-10-CM

## 2021-05-08 DIAGNOSIS — S42332A Displaced oblique fracture of shaft of humerus, left arm, initial encounter for closed fracture: Secondary | ICD-10-CM | POA: Insufficient documentation

## 2021-05-08 MED ORDER — OXYCODONE-ACETAMINOPHEN 5-325 MG PO TABS
1.0000 | ORAL_TABLET | Freq: Once | ORAL | Status: AC
  Administered 2021-05-08: 1 via ORAL
  Filled 2021-05-08: qty 1

## 2021-05-08 MED ORDER — OXYCODONE-ACETAMINOPHEN 5-325 MG PO TABS: 1.0000 | ORAL_TABLET | ORAL | 0 refills | Status: DC | PRN

## 2021-05-08 NOTE — ED Provider Notes (Signed)
[facility] DEPT Provider Note   CSN: [id number] Arrival date & time: 05/08/21  0159     History Chief Complaint  Patient presents with   Arm Injury    Lynn Moody is a 50 y.o. female.  The history is provided by the patient and medical records.  Arm Injury  50 y.o. F with hx of anemia, DM, presenting to the ED for left shoulder pain.  States she was walking through laundry area in her home when she tripped over a stick and fell, impact of left shoulder against the wall.  No head injury or LOC.  She reports continued pain to left shoulder and upper arm, some mild pain radiating down the arm.  She is right hand dominant.  Did take motrin PTA with some relief.  Past Medical History:  Diagnosis Date   Anemia    Diabetes mellitus without complication [facility])     Patient Active Problem List   Diagnosis Date Noted   History of miscarriage 08/19/2015   Elevated WBC count 05/18/2015   Pneumococcal vaccination declined 03/15/2015   Diabetes mellitus, type 2 ([place]) 12/23/2014   IMPAIRED GLUCOSE TOLERANCE TEST 10/13/2010    Past Surgical History:  Procedure Laterality Date   BREAST BIOPSY     left breast with maker placed   CESAREAN SECTION     DENTAL SURGERY       OB History     Gravida  7   Para  4   Term      Preterm      AB  2   Living  4      SAB  2   IAB      Ectopic      Multiple      Live Births  2           No family history on file.  Social History   Tobacco Use   Smoking status: Never  Substance Use Topics   Alcohol use: No   Drug use: No    Home Medications Prior to Admission medications   Medication Sig Start Date End Date Taking? Authorizing Provider  acetaminophen (TYLENOL) 500 MG tablet Take 500 mg by mouth every 6 (six) hours as needed.    [provider]  ibuprofen (ADVIL,MOTRIN) 600 MG tablet Take 1 tablet (600 mg total) by mouth every 6 (six) hours as needed. Patient not taking:  Reported on 08/19/2015 08/03/15   Jorje Guild, NP  insulin detemir (LEVEMIR) 100 UNIT/ML injection Inject 20 Units into the skin at bedtime.     [provider]  oxyCODONE-acetaminophen (PERCOCET/ROXICET) 5-325 MG per tablet Take 1 tablet by mouth every 4 (four) hours as needed for moderate pain or severe pain. Patient not taking: Reported on 08/19/2015 08/03/15   Jorje Guild, NP  Prenatal Vit-Fe Fumarate-FA (PRENATAL MULTIVITAMIN) TABS tablet Take 1 tablet by mouth daily.    [provider]    Allergies    Patient has no known allergies.  Review of Systems   Review of Systems  Musculoskeletal:  Positive for arthralgias.  All other systems reviewed and are negative.  Physical Exam Updated Vital Signs BP (!) 184/110 (BP Location: Right Arm)   Pulse 92   Temp 98.4 F (36.9 C) (Oral)   Resp 16   Ht 5\' 4"  (1.626 m)   Wt 95.3 kg   SpO2 99%   BMI 36.05 kg/m   Physical Exam Vitals and nursing note reviewed.  Constitutional:      Appearance: She is well-developed.  HENT:     Head: Normocephalic and atraumatic.  Eyes:     Conjunctiva/sclera: Conjunctivae normal.     Pupils: Pupils are equal, round, and reactive to light.  Cardiovascular:     Rate and Rhythm: Normal rate and regular rhythm.     Heart sounds: Normal heart sounds.  Pulmonary:     Effort: Pulmonary effort is normal.     Breath sounds: Normal breath sounds.  Abdominal:     General: Bowel sounds are normal.     Palpations: Abdomen is soft.  Musculoskeletal:        General: Normal range of motion.     Cervical back: Normal range of motion.     Comments: Icepack over left shoulder/deltoid, left shoulder grossly normal in appearance without large deformity, arm is held flexed across the chest, radial pulse intact, able to wiggle fingers normally Lower arm/forearm is non-tender  Skin:    General: Skin is warm and dry.  Neurological:     Mental Status: She is alert and oriented to person, place,  and time.    ED Results / Procedures / Treatments   Labs (all labs ordered are listed, but only abnormal results are displayed) Labs Reviewed - No data to display  EKG None  Radiology DG Shoulder Left  Result Date: 05/08/2021 CLINICAL DATA:  Fall next field EXAM: LEFT SHOULDER - 2+ VIEW COMPARISON:  None. FINDINGS: There is no evidence of fracture or dislocation. Mild glenohumeral and acromioclavicular arthrosis. Soft tissues and included portions of the left chest are unremarkable. IMPRESSION: Negative. Electronically Signed   By: Lovena Le M.D.   On: 05/08/2021 03:40   DG Humerus Left  Result Date: 05/08/2021 CLINICAL DATA:  Fall with left arm injury EXAM: LEFT HUMERUS - 2+ VIEW COMPARISON:  None. FINDINGS: Oblique fracture through the distal humeral shaft with apex lateral angulation and 50% posterior displacement. No dislocation seen at the elbow or shoulder. IMPRESSION: Displaced and angulated distal humeral shaft fracture. Electronically Signed   By: Monte Fantasia M.D.   On: 05/08/2021 05:36     Procedures Procedures   Medications Ordered in ED Medications - No data to display  ED Course  I have reviewed the triage vital signs and the nursing notes.  Pertinent labs & imaging results that were available during my care of the patient were reviewed by me and considered in my medical decision making (see chart for details).    MDM Rules/Calculators/A&P  50 year old female presenting to the ED after a fall.  Tripped over a stick in her laundry room and fell with left shoulder into the wall.  There was no head injury or loss of consciousness.  She mostly complains of left shoulder but does have some pain radiating down the arm.   Her arm is neurovascularly intact.  Shoulder films are negative.  Discussed with patient regarding arm sling, she would like to try this for support.  If improving, I feel it is reasonable to have her follow-up with her primary care doctor, otherwise  can follow-up with on-call orthopedics if needed.  She is given contact information.  Can return here for new concerns.  Shortly after discussion, patient with worsening pain in left elbow when attempting sling/movement.  X-rays ordered of this area, she does have distal humerus fracture with some angulation and displacement.  This appears to be just distal to the portion of humerus visualized on shoulder films.  Discussed with on call orthopedics, Dr. Percell Miller-- recommends long arm splint, sling for now and he will see her this morning in clinic.  I did apologize to patient about having to send her back for additional x-rays and further prolonging her visit, she was understanding of this but appreciative of expedited orthopedic follow-up this morning.  Rx percocet for pain control, advised can cause drowsiness and to avoid driving while taking this.  Return here for new concerns.  Final Clinical Impression(s) / ED Diagnoses Final diagnoses:  Fall, initial encounter  Acute pain of left shoulder    Rx / DC Orders ED Discharge Orders     None        Larene Pickett, PA-C 05/08/21 0630    Ripley Fraise, MD 05/08/21 2330

## 2021-05-08 NOTE — ED Triage Notes (Signed)
Pt came in with c/o L arm injury. Pt fell and hit the wall. Pt c/o shoulder and proximal L arm pain. Pt did not hit her head or lose consciousness

## 2021-05-08 NOTE — Progress Notes (Addendum)
Orthopedic Tech Progress Note Patient Details:  Lynn Moody 06-May-1971 [id number]  Ortho Devices Type of Ortho Device: Arm sling, Post (long arm) splint Ortho Device/Splint Location: lle Ortho Device/Splint Interventions: Ordered, Application, Adjustment  I applied a posterior long arm splint. Dr said it was just to get the patient to the drs office this morning. Post Interventions Patient Tolerated: Well Instructions Provided: Care of device, Adjustment of device  Lynn Moody 05/08/2021, 7:02 AM

## 2021-05-08 NOTE — ED Notes (Signed)
Ortho Tech called to apply long arm splint.

## 2021-05-08 NOTE — Discharge Instructions (Addendum)
Leave splint and sling in place. See Dr. Percell Miller in clinic today anytime after 8:30AM.  He is expecting you. Take pain medication as directed-- use with caution and do not drive while taking this.  It can cause drowsiness.

## 2021-05-08 NOTE — H&P (Signed)
PREOPERATIVE H&P  Chief Complaint: left orif elbow,injury to radial nerve  HPI: Lynn Moody is a 50 y.o. female who is scheduled for, Procedure(s): OPEN REDUCTION INTERNAL FIXATION (ORIF) DISTAL HUMERUS FRACTURE SUPERFICIAL PERONEAL NERVE RELEASE.   Patient has a past medical history significant for DM and HTn.   She fell on 05/08/2021 when she tripped over something in her laundry room, hit the wall and twisted her arm.  She was seen in the emergency room and has a supracondylar humerus fracture.  She does report slight numbness in her hand that comes and goes.    Her symptoms are rated as moderate to severe, and have been worsening.  This is significantly impairing activities of daily living.    Please see clinic note for further details on this patient's care.    She has elected for surgical management.   Past Medical History:  Diagnosis Date   Anemia    Diabetes mellitus without complication ([place])    Hypertension    Past Surgical History:  Procedure Laterality Date   BREAST BIOPSY     left breast with maker placed   CESAREAN SECTION     DENTAL SURGERY     Social History   Socioeconomic History   Marital status: Married    Spouse name: Not on file   Number of children: Not on file   Years of education: Not on file   Highest education level: Not on file  Occupational History   Not on file  Tobacco Use   Smoking status: Never   Smokeless tobacco: Never  Substance and Sexual Activity   Alcohol use: No   Drug use: No   Sexual activity: Yes  Other Topics Concern   Not on file  Social History Narrative   Not on file   Social Determinants of Health   Financial Resource Strain: Not on file  Food Insecurity: Not on file  Transportation Needs: Not on file  Physical Activity: Not on file  Stress: Not on file  Social Connections: Not on file   History reviewed. No pertinent family history. No Known Allergies Prior to Admission medications    Medication Sig Start Date End Date Taking? Authorizing Provider  amLODipine-benazepril (LOTREL) 5-20 MG capsule Take 1 capsule by mouth daily.   Yes [provider]  Dulaglutide (TRULICITY) 1.5 NW/2.9FA SOPN Inject into the skin.   Yes [provider]  glimepiride (AMARYL) 4 MG tablet Take 4 mg by mouth daily with breakfast.   Yes [provider]  oxyCODONE-acetaminophen (PERCOCET) 5-325 MG tablet Take 1 tablet by mouth every 4 (four) hours as needed. 05/08/21  Yes Larene Pickett, PA-C  pioglitazone (ACTOS) 45 MG tablet Take 45 mg by mouth daily.   Yes [provider]  ibuprofen (ADVIL,MOTRIN) 600 MG tablet Take 1 tablet (600 mg total) by mouth every 6 (six) hours as needed. Patient not taking: Reported on 08/19/2015 08/03/15   Jorje Guild, NP  Prenatal Vit-Fe Fumarate-FA (PRENATAL MULTIVITAMIN) TABS tablet Take 1 tablet by mouth daily.    [provider]    ROS: All other systems have been reviewed and were otherwise negative with the exception of those mentioned in the HPI and as above.  Physical Exam: General: Alert, no acute distress Cardiovascular: No pedal edema Respiratory: No cyanosis, no use of accessory musculature GI: No organomegaly, abdomen is soft and non-tender Skin: No lesions in the area of chief complaint Neurologic: Sensation intact distally Psychiatric: Patient is competent  for consent with normal mood and affect Lymphatic: No axillary or cervical lymphadenopathy  MUSCULOSKELETAL:  Left upper extremity: neurovascularly intact.  She does have intact EPL function.  Splint is benign.   Imaging: X-rays show a spiral supracondylar fracture.    Assessment: left orif elbow,injury to radial nerve  Plan: Plan for Procedure(s): OPEN REDUCTION INTERNAL FIXATION (ORIF) DISTAL HUMERUS FRACTURE SUPERFICIAL PERONEAL NERVE RELEASE  The risks benefits and alternatives were discussed with the patient including but not limited to  the risks of nonoperative treatment, versus surgical intervention including infection, bleeding, nerve injury,  blood clots, cardiopulmonary complications, morbidity, mortality, among others, and they were willing to proceed.   The patient acknowledged the explanation, agreed to proceed with the plan and consent was signed.   Operative Plan: ORIF left supracondylar humerus fracture with radial nerve neuroplasty Discharge Medications: Tylenol, Celebrex, Gabapentin, Oxycodone, Zofran DVT Prophylaxis: None Physical Therapy: Outpatient PT Special Discharge needs: Bee, PA-C  05/08/2021 3:00 PM

## 2021-05-09 ENCOUNTER — Encounter (HOSPITAL_BASED_OUTPATIENT_CLINIC_OR_DEPARTMENT_OTHER)
Admission: RE | Admit: 2021-05-09 | Discharge: 2021-05-09 | Disposition: A | Discharge status: Home / Self Care | Payer: Medicaid Other | Source: Ambulatory Visit | Attending: Orthopaedic Surgery | Admitting: Orthopaedic Surgery

## 2021-05-09 DIAGNOSIS — Z01818 Encounter for other preprocedural examination: Secondary | ICD-10-CM | POA: Diagnosis not present

## 2021-05-09 LAB — BASIC METABOLIC PANEL
Anion gap: 6 (ref 5–15)
BUN: 6 mg/dL (ref 6–20)
CO2: 28 mmol/L (ref 22–32)
Calcium: 9.1 mg/dL (ref 8.9–10.3)
Chloride: 102 mmol/L (ref 98–111)
Creatinine, Ser: 0.51 mg/dL (ref 0.44–1.00)
GFR, Estimated: >60 mL/min (ref >60)
Glucose, Bld: 94 mg/dL (ref 70–99)
Potassium: 3.9 mmol/L (ref 3.5–5.1)
Sodium: 136 mmol/L (ref 135–145)

## 2021-05-11 ENCOUNTER — Ambulatory Visit (HOSPITAL_BASED_OUTPATIENT_CLINIC_OR_DEPARTMENT_OTHER): Payer: Medicaid Other | Admitting: Certified Registered"

## 2021-05-11 ENCOUNTER — Ambulatory Visit (HOSPITAL_COMMUNITY): Payer: Medicaid Other

## 2021-05-11 ENCOUNTER — Ambulatory Visit (HOSPITAL_BASED_OUTPATIENT_CLINIC_OR_DEPARTMENT_OTHER)
Admission: RE | Admit: 2021-05-11 | Discharge: 2021-05-11 | Disposition: A | Discharge status: Home / Self Care | Payer: Medicaid Other | Attending: Orthopaedic Surgery | Admitting: Orthopaedic Surgery

## 2021-05-11 ENCOUNTER — Encounter (HOSPITAL_BASED_OUTPATIENT_CLINIC_OR_DEPARTMENT_OTHER): Payer: Self-pay | Admitting: Orthopaedic Surgery

## 2021-05-11 ENCOUNTER — Encounter (HOSPITAL_BASED_OUTPATIENT_CLINIC_OR_DEPARTMENT_OTHER)
Admission: RE | Disposition: A | Discharge status: Home / Self Care | Payer: Self-pay | Source: Home / Self Care | Attending: Orthopaedic Surgery

## 2021-05-11 ENCOUNTER — Other Ambulatory Visit: Payer: Self-pay

## 2021-05-11 DIAGNOSIS — Y92008 Other place in unspecified non-institutional (private) residence as the place of occurrence of the external cause: Secondary | ICD-10-CM | POA: Insufficient documentation

## 2021-05-11 DIAGNOSIS — W010XXA Fall on same level from slipping, tripping and stumbling without subsequent striking against object, initial encounter: Secondary | ICD-10-CM | POA: Insufficient documentation

## 2021-05-11 DIAGNOSIS — Z79899 Other long term (current) drug therapy: Secondary | ICD-10-CM | POA: Insufficient documentation

## 2021-05-11 DIAGNOSIS — X501XXA Overexertion from prolonged static or awkward postures, initial encounter: Secondary | ICD-10-CM | POA: Diagnosis not present

## 2021-05-11 DIAGNOSIS — S4422XA Injury of radial nerve at upper arm level, left arm, initial encounter: Secondary | ICD-10-CM | POA: Diagnosis not present

## 2021-05-11 DIAGNOSIS — Z419 Encounter for procedure for purposes other than remedying health state, unspecified: Secondary | ICD-10-CM

## 2021-05-11 DIAGNOSIS — Z09 Encounter for follow-up examination after completed treatment for conditions other than malignant neoplasm: Secondary | ICD-10-CM

## 2021-05-11 DIAGNOSIS — S42412A Displaced simple supracondylar fracture without intercondylar fracture of left humerus, initial encounter for closed fracture: Secondary | ICD-10-CM | POA: Insufficient documentation

## 2021-05-11 DIAGNOSIS — Z7984 Long term (current) use of oral hypoglycemic drugs: Secondary | ICD-10-CM | POA: Diagnosis not present

## 2021-05-11 HISTORY — PX: ULNAR NERVE TRANSPOSITION: SHX2595

## 2021-05-11 HISTORY — DX: Essential (primary) hypertension: I10

## 2021-05-11 HISTORY — PX: ORIF ELBOW FRACTURE: SHX5031

## 2021-05-11 LAB — GLUCOSE, CAPILLARY
Glucose-Capillary: 100 mg/dL — ABNORMAL HIGH (ref 70–99)
Glucose-Capillary: 113 mg/dL — ABNORMAL HIGH (ref 70–99)

## 2021-05-11 LAB — POCT PREGNANCY, URINE: Preg Test, Ur: NEGATIVE

## 2021-05-11 SURGERY — OPEN REDUCTION INTERNAL FIXATION (ORIF) ELBOW/OLECRANON FRACTURE
Anesthesia: General | Site: Elbow | Laterality: Left

## 2021-05-11 MED ORDER — VANCOMYCIN HCL 1000 MG IV SOLR
INTRAVENOUS | Status: DC | PRN
  Administered 2021-05-11: 1000 mg via TOPICAL

## 2021-05-11 MED ORDER — LACTATED RINGERS IV SOLN: INTRAVENOUS | Status: DC

## 2021-05-11 MED ORDER — SUGAMMADEX SODIUM 200 MG/2ML IV SOLN
INTRAVENOUS | Status: DC | PRN
  Administered 2021-05-11: 400 mg via INTRAVENOUS

## 2021-05-11 MED ORDER — GABAPENTIN 100 MG PO CAPS: 100.0000 mg | ORAL_CAPSULE | Freq: Three times a day (TID) | ORAL | 0 refills | Status: DC

## 2021-05-11 MED ORDER — PROPOFOL 10 MG/ML IV BOLUS
INTRAVENOUS | Status: DC | PRN
  Administered 2021-05-11: 200 mg via INTRAVENOUS

## 2021-05-11 MED ORDER — ROCURONIUM BROMIDE 10 MG/ML (PF) SYRINGE
PREFILLED_SYRINGE | INTRAVENOUS | Status: AC
  Filled 2021-05-11: qty 10

## 2021-05-11 MED ORDER — FENTANYL CITRATE (PF) 100 MCG/2ML IJ SOLN
INTRAMUSCULAR | Status: AC
  Filled 2021-05-11: qty 2

## 2021-05-11 MED ORDER — ACETAMINOPHEN 500 MG PO TABS: 1000.0000 mg | ORAL_TABLET | Freq: Three times a day (TID) | ORAL | 0 refills | Status: AC

## 2021-05-11 MED ORDER — ONDANSETRON HCL 4 MG/2ML IJ SOLN
INTRAMUSCULAR | Status: DC | PRN
  Administered 2021-05-11: 4 mg via INTRAVENOUS

## 2021-05-11 MED ORDER — PHENYLEPHRINE HCL (PRESSORS) 10 MG/ML IV SOLN
INTRAVENOUS | Status: DC | PRN
  Administered 2021-05-11: 120 ug via INTRAVENOUS

## 2021-05-11 MED ORDER — MIDAZOLAM HCL 2 MG/2ML IJ SOLN
INTRAMUSCULAR | Status: AC
  Filled 2021-05-11: qty 2

## 2021-05-11 MED ORDER — OXYCODONE HCL 5 MG PO TABS
ORAL_TABLET | ORAL | Status: AC
  Filled 2021-05-11: qty 1

## 2021-05-11 MED ORDER — MEPERIDINE HCL 25 MG/ML IJ SOLN: 6.2500 mg | INTRAMUSCULAR | Status: DC | PRN

## 2021-05-11 MED ORDER — LIDOCAINE HCL (PF) 2 % IJ SOLN
INTRAMUSCULAR | Status: AC
  Filled 2021-05-11: qty 5

## 2021-05-11 MED ORDER — LIDOCAINE 2% (20 MG/ML) 5 ML SYRINGE
INTRAMUSCULAR | Status: DC | PRN
  Administered 2021-05-11: 100 mg via INTRAVENOUS

## 2021-05-11 MED ORDER — ONDANSETRON HCL 4 MG PO TABS: 4.0000 mg | ORAL_TABLET | Freq: Three times a day (TID) | ORAL | 0 refills | Status: AC | PRN

## 2021-05-11 MED ORDER — BUPIVACAINE HCL (PF) 0.25 % IJ SOLN
INTRAMUSCULAR | Status: AC
  Filled 2021-05-11: qty 30

## 2021-05-11 MED ORDER — CEFAZOLIN SODIUM-DEXTROSE 2-4 GM/100ML-% IV SOLN
2.0000 g | INTRAVENOUS | Status: AC
Start: 2021-05-11 — End: 2021-05-11
  Administered 2021-05-11: 2 g via INTRAVENOUS

## 2021-05-11 MED ORDER — CELECOXIB 100 MG PO CAPS: 100.0000 mg | ORAL_CAPSULE | Freq: Two times a day (BID) | ORAL | 0 refills | Status: AC

## 2021-05-11 MED ORDER — ACETAMINOPHEN 160 MG/5ML PO SOLN: 325.0000 mg | ORAL | Status: DC | PRN

## 2021-05-11 MED ORDER — ACETAMINOPHEN 325 MG PO TABS: 325.0000 mg | ORAL_TABLET | ORAL | Status: DC | PRN

## 2021-05-11 MED ORDER — 0.9 % SODIUM CHLORIDE (POUR BTL) OPTIME
TOPICAL | Status: DC | PRN
  Administered 2021-05-11: 1000 mL

## 2021-05-11 MED ORDER — OXYCODONE HCL 5 MG/5ML PO SOLN
5.0000 mg | Freq: Once | ORAL | Status: AC | PRN
Start: 2021-05-11 — End: 2021-05-11

## 2021-05-11 MED ORDER — OXYCODONE HCL 5 MG PO TABS: ORAL_TABLET | ORAL | 0 refills | Status: AC

## 2021-05-11 MED ORDER — OXYCODONE HCL 5 MG PO TABS
5.0000 mg | ORAL_TABLET | Freq: Once | ORAL | Status: AC | PRN
  Administered 2021-05-11: 5 mg via ORAL

## 2021-05-11 MED ORDER — FENTANYL CITRATE (PF) 100 MCG/2ML IJ SOLN
25.0000 ug | INTRAMUSCULAR | Status: DC | PRN
  Administered 2021-05-11: 25 ug via INTRAVENOUS
  Administered 2021-05-11 (×2): 50 ug via INTRAVENOUS

## 2021-05-11 MED ORDER — CEFAZOLIN SODIUM-DEXTROSE 2-4 GM/100ML-% IV SOLN
INTRAVENOUS | Status: AC
  Filled 2021-05-11: qty 100

## 2021-05-11 MED ORDER — DEXAMETHASONE SODIUM PHOSPHATE 10 MG/ML IJ SOLN
INTRAMUSCULAR | Status: DC | PRN
  Administered 2021-05-11: 5 mg via INTRAVENOUS

## 2021-05-11 MED ORDER — BUPIVACAINE HCL (PF) 0.25 % IJ SOLN
INTRAMUSCULAR | Status: DC | PRN
  Administered 2021-05-11: 30 mL

## 2021-05-11 MED ORDER — MIDAZOLAM HCL 5 MG/5ML IJ SOLN
INTRAMUSCULAR | Status: DC | PRN
  Administered 2021-05-11: 2 mg via INTRAVENOUS

## 2021-05-11 MED ORDER — ROCURONIUM BROMIDE 100 MG/10ML IV SOLN
INTRAVENOUS | Status: DC | PRN
  Administered 2021-05-11: 70 mg via INTRAVENOUS

## 2021-05-11 MED ORDER — KETOROLAC TROMETHAMINE 30 MG/ML IJ SOLN: 30.0000 mg | Freq: Once | INTRAMUSCULAR | Status: DC | PRN

## 2021-05-11 MED ORDER — FENTANYL CITRATE (PF) 100 MCG/2ML IJ SOLN
INTRAMUSCULAR | Status: DC | PRN
  Administered 2021-05-11: 25 ug via INTRAVENOUS
  Administered 2021-05-11: 100 ug via INTRAVENOUS
  Administered 2021-05-11: 25 ug via INTRAVENOUS
  Administered 2021-05-11: 100 ug via INTRAVENOUS
  Administered 2021-05-11: 50 ug via INTRAVENOUS

## 2021-05-11 MED ORDER — ONDANSETRON HCL 4 MG/2ML IJ SOLN: 4.0000 mg | Freq: Once | INTRAMUSCULAR | Status: DC | PRN

## 2021-05-11 MED ORDER — ONDANSETRON HCL 4 MG/2ML IJ SOLN
INTRAMUSCULAR | Status: AC
  Filled 2021-05-11: qty 2

## 2021-05-11 MED ORDER — PROPOFOL 10 MG/ML IV BOLUS
INTRAVENOUS | Status: AC
  Filled 2021-05-11: qty 20

## 2021-05-11 SURGICAL SUPPLY — 90 items
APL PRP STRL LF DISP 70% ISPRP (MISCELLANEOUS) ×6
BIT DRILL 110X30X2XCALB (BIT) ×3 IMPLANT
BIT DRILL 2.5X110 QC LCP DISP (BIT) ×4 IMPLANT
BIT DRILL 2.8 (BIT) ×3
BIT DRILL CANN QC 2.8X165 (BIT) ×3 IMPLANT
BIT DRILL L45 QC 2.7X125 (BIT) ×3 IMPLANT
BIT DRILL QC 2.0X100 (BIT) ×4
BIT DRILL QC 2.7X125 (BIT) ×4
BIT DRL 110X30X2XCALB (BIT) ×3
BLADE HEX COATED 2.75 (ELECTRODE) IMPLANT
BLADE SURG 10 STRL SS (BLADE) ×4 IMPLANT
BLADE SURG 15 STRL LF DISP TIS (BLADE) ×6 IMPLANT
BLADE SURG 15 STRL SS (BLADE) ×8
BNDG CMPR 9X4 STRL LF SNTH (GAUZE/BANDAGES/DRESSINGS)
BNDG ELASTIC 4X5.8 VLCR STR LF (GAUZE/BANDAGES/DRESSINGS) ×4 IMPLANT
BNDG ESMARK 4X9 LF (GAUZE/BANDAGES/DRESSINGS) IMPLANT
CHLORAPREP W/TINT 26 (MISCELLANEOUS) ×8 IMPLANT
CLSR STERI-STRIP ANTIMIC 1/2X4 (GAUZE/BANDAGES/DRESSINGS) ×8 IMPLANT
COVER WAND RF STERILE (DRAPES) IMPLANT
CUFF TOURN SGL QUICK 18X3 (MISCELLANEOUS) IMPLANT
CUFF TOURN SGL QUICK 24 (TOURNIQUET CUFF)
CUFF TRNQT CYL 24X4X16.5-23 (TOURNIQUET CUFF) IMPLANT
DECANTER SPIKE VIAL GLASS SM (MISCELLANEOUS) IMPLANT
DRAPE C-ARM 42X72 X-RAY (DRAPES) ×4 IMPLANT
DRAPE C-ARMOR (DRAPES) ×4 IMPLANT
DRAPE EXTREMITY T 121X128X90 (DISPOSABLE) ×4 IMPLANT
DRAPE IMP U-DRAPE 54X76 (DRAPES) ×4 IMPLANT
DRAPE INCISE IOBAN 66X45 STRL (DRAPES) IMPLANT
DRAPE OEC MINIVIEW 54X84 (DRAPES) IMPLANT
DRAPE STERI 35X30 U-POUCH (DRAPES) IMPLANT
DRAPE U-SHAPE 47X51 STRL (DRAPES) ×4 IMPLANT
DRILL BIT 2.8MM (BIT) ×4
DRSG AQUACEL AG ADV 3.5X 6 (GAUZE/BANDAGES/DRESSINGS) ×4 IMPLANT
DRSG AQUACEL AG ADV 3.5X10 (GAUZE/BANDAGES/DRESSINGS) ×4 IMPLANT
ELECT REM PT RETURN 9FT ADLT (ELECTROSURGICAL) ×4
ELECTRODE REM PT RTRN 9FT ADLT (ELECTROSURGICAL) ×3 IMPLANT
EXT HOSE W/PLC CONNECTION (MISCELLANEOUS)
EXTENSION HOSE W/PLC CONNECTON (MISCELLANEOUS) IMPLANT
GAUZE SPONGE 4X4 12PLY STRL (GAUZE/BANDAGES/DRESSINGS) ×4 IMPLANT
GLOVE SRG 8 PF TXTR STRL LF DI (GLOVE) ×3 IMPLANT
GLOVE SURG ENC MOIS LTX SZ6.5 (GLOVE) ×4 IMPLANT
GLOVE SURG LTX SZ8 (GLOVE) ×4 IMPLANT
GLOVE SURG UNDER POLY LF SZ6.5 (GLOVE) ×4 IMPLANT
GLOVE SURG UNDER POLY LF SZ8 (GLOVE) ×4
GOWN STRL REUS W/ TWL LRG LVL3 (GOWN DISPOSABLE) ×6 IMPLANT
GOWN STRL REUS W/TWL LRG LVL3 (GOWN DISPOSABLE) ×8
GOWN STRL REUS W/TWL XL LVL3 (GOWN DISPOSABLE) ×4 IMPLANT
LOOP VESSEL MAXI BLUE (MISCELLANEOUS) ×4 IMPLANT
NS IRRIG 1000ML POUR BTL (IV SOLUTION) ×4 IMPLANT
PACK ARTHROSCOPY DSU (CUSTOM PROCEDURE TRAY) ×4 IMPLANT
PACK BASIN DAY SURGERY FS (CUSTOM PROCEDURE TRAY) ×4 IMPLANT
PAD CAST 4YDX4 CTTN HI CHSV (CAST SUPPLIES) ×3 IMPLANT
PADDING CAST COTTON 4X4 STRL (CAST SUPPLIES) ×4
PENCIL SMOKE EVACUATOR (MISCELLANEOUS) ×4 IMPLANT
PLATE DIS HUM LT 3.5 8H 194 (Plate) ×4 IMPLANT
SCREW CORT ST T8 SD REC 2.7X20 (Screw) ×4 IMPLANT
SCREW CORTEX 3.5 20MM (Screw) ×1 IMPLANT
SCREW CORTEX 3.5 22MM (Screw) ×3 IMPLANT
SCREW CORTEX 3.5 32MM (Screw) ×1 IMPLANT
SCREW CORTICAL 2.7X24MM (Screw) ×4 IMPLANT
SCREW LOCK CORT ST 3.5X20 (Screw) ×3 IMPLANT
SCREW LOCK CORT ST 3.5X22 (Screw) ×9 IMPLANT
SCREW LOCK CORT ST 3.5X32 (Screw) ×3 IMPLANT
SCREW LOCK T15 FT 16X3.5X2.9X (Screw) ×3 IMPLANT
SCREW LOCK T15 FT 24X3.5X2.9X (Screw) ×3 IMPLANT
SCREW LOCKING 3.5X16 (Screw) ×4 IMPLANT
SCREW LOCKING 3.5X24 (Screw) ×4 IMPLANT
SCREW LOCKING 3.5X26 (Screw) ×4 IMPLANT
SHEET MEDIUM DRAPE 40X70 STRL (DRAPES) ×4 IMPLANT
SLEEVE SCD COMPRESS KNEE MED (STOCKING) ×4 IMPLANT
SLING ARM FOAM STRAP LRG (SOFTGOODS) ×4 IMPLANT
SPLINT FAST PLASTER 5X30 (CAST SUPPLIES)
SPLINT PLASTER CAST FAST 5X30 (CAST SUPPLIES) IMPLANT
SPONGE LAP 18X18 RF (DISPOSABLE) ×8 IMPLANT
SUCTION FRAZIER HANDLE 10FR (MISCELLANEOUS)
SUCTION TUBE FRAZIER 10FR DISP (MISCELLANEOUS) IMPLANT
SUT MNCRL AB 4-0 PS2 18 (SUTURE) ×8 IMPLANT
SUT VIC AB 0 CT1 27 (SUTURE) ×12
SUT VIC AB 0 CT1 27XBRD ANBCTR (SUTURE) ×9 IMPLANT
SUT VIC AB 1 CT1 27 (SUTURE)
SUT VIC AB 1 CT1 27XBRD ANBCTR (SUTURE) IMPLANT
SUT VIC AB 2-0 CT1 27 (SUTURE)
SUT VIC AB 2-0 CT1 TAPERPNT 27 (SUTURE) IMPLANT
SUT VIC AB 2-0 SH 27 (SUTURE)
SUT VIC AB 2-0 SH 27XBRD (SUTURE) IMPLANT
SUT VIC AB 3-0 SH 27 (SUTURE) ×12
SUT VIC AB 3-0 SH 27X BRD (SUTURE) ×9 IMPLANT
TOWEL GREEN STERILE FF (TOWEL DISPOSABLE) ×8 IMPLANT
TUBE CONNECTING 20X1/4 (TUBING) ×4 IMPLANT
TUBE SUCTION HIGH CAP CLEAR NV (SUCTIONS) ×4 IMPLANT

## 2021-05-11 NOTE — Anesthesia Postprocedure Evaluation (Signed)
Anesthesia Post Note  Patient: Lynn Moody  Procedure(s) Performed: OPEN REDUCTION INTERNAL FIXATION DISTAL HUMERUS FRACTURE (Left: Elbow) RADIAL  NERVE DECOMPRESSION (Left: Arm Upper)     Patient location during evaluation: PACU Anesthesia Type: General Level of consciousness: awake and sedated Pain management: pain level controlled Vital Signs Assessment: post-procedure vital signs reviewed and stable Respiratory status: spontaneous breathing Cardiovascular status: stable Postop Assessment: no apparent nausea or vomiting Anesthetic complications: no   No notable events documented.  Last Vitals:  Vitals:   05/11/21 1426 05/11/21 1430  BP: (!) 177/98 (!) 181/93  Pulse: (!) 110 (!) 108  Resp: 17 16  Temp: 37.1 C   SpO2: 100% 100%    Last Pain:  Vitals:   05/11/21 1430  TempSrc:   PainSc: [place]

## 2021-05-11 NOTE — Transfer of Care (Signed)
Immediate Anesthesia Transfer of Care Note  Patient: Lynn Moody  Procedure(s) Performed: OPEN REDUCTION INTERNAL FIXATION DISTAL HUMERUS FRACTURE (Left: Elbow) RADIAL  NERVE DECOMPRESSION (Left: Arm Upper)  Patient Location: PACU  Anesthesia Type:General  Level of Consciousness: awake, alert  and oriented  Airway & Oxygen Therapy: Patient Spontanous Breathing and Patient connected to face mask oxygen  Post-op Assessment: Report given to RN and Post -op Vital signs reviewed and stable  Post vital signs: Reviewed and stable  Last Vitals:  Vitals Value Taken Time  BP 181/99 05/11/21 1424  Temp    Pulse 109 05/11/21 1426  Resp 14 05/11/21 1426  SpO2 100 % 05/11/21 1426  Vitals shown include unvalidated device data.  Last Pain:  Vitals:   05/11/21 1119  TempSrc: Oral  PainSc: 9       Patients Stated Pain Goal: 4 (30/94/07 6808)  Complications: No notable events documented.

## 2021-05-11 NOTE — Anesthesia Procedure Notes (Signed)
Procedure Name: Intubation Date/Time: 05/11/2021 12:34 PM Performed by: Verita Lamb, CRNA Pre-anesthesia Checklist: Patient identified, Emergency Drugs available, Suction available and Patient being monitored Patient Re-evaluated:Patient Re-evaluated prior to induction Oxygen Delivery Method: Circle system utilized Preoxygenation: Pre-oxygenation with 100% oxygen Induction Type: IV induction Ventilation: Mask ventilation without difficulty Laryngoscope Size: Mac and 3 Grade View: Grade I Tube type: Oral Tube size: 7.0 mm Number of attempts: 1 Airway Equipment and Method: Stylet and Oral airway Placement Confirmation: ETT inserted through vocal cords under direct vision, positive ETCO2 and breath sounds checked- equal and bilateral Tube secured with: Tape Dental Injury: Teeth and Oropharynx as per pre-operative assessment

## 2021-05-11 NOTE — Discharge Instructions (Addendum)
Ophelia Charter MD, MPH Noemi Chapel, PA-C [facility] [address], [address] [phone] (tel)   [phone] (fax)   [place] may leave the operative dressing in place until your follow-up appointment. KEEP THE INCISIONS CLEAN AND DRY. There may be a small amount of fluid/bleeding leaking at the surgical site. This is normal after surgery.  If it fills with liquid or blood please call us immediately to change it for you. Use the provided ice machine or Ice packs as often as possible for the first 3-4 days, then as needed for pain relief.   Keep a layer of cloth or a shirt between your skin and the cooling unit to prevent frost bite as it can get very cold.  SHOWERING: - You may shower on Post-Op Day #2.  - Remove the ACE bandage and the white wrap before showering - Leave the rectangular bandage in place - this bandage is water resistant - Leave the rectangular bandage in place until your follow-up appointment - The dressing is water resistant but do not scrub it as it may start to peel up.   - You may remove the sling for showering, but keep a water resistant pillow under the arm to keep both the  elbow and shoulder away from the body (mimicking the abduction sling).  - Gently pat the area dry.  - Do not soak the shoulder in water. Do not go swimming in the pool or ocean until your incision has completely healed - KEEP THE INCISIONS CLEAN AND DRY.  EXERCISES You may use your sling for comfort Do not lift anything heavy with your operative arm  REGIONAL ANESTHESIA (NERVE BLOCKS) The anesthesia team may have performed a nerve block for you if safe in the setting of your care.  This is a great tool used to minimize pain.  Typically the block may start wearing off overnight but the long acting medicine may last for 3-4 days.  The nerve block wearing off can be a challenging period but please utilize your as needed pain  medications to try and manage this period.    POST-OP MEDICATIONS- Multimodal approach to pain control In general your pain will be controlled with a combination of substances.  Prescriptions unless otherwise discussed are electronically sent to your pharmacy.  This is a carefully made plan we use to minimize narcotic use.     Celebrex - Anti-inflammatory medication taken on a scheduled basis Acetaminophen - Non-narcotic pain medicine taken on a scheduled basis  Gabapentin - this is a non-narcotic medication to help with pain, take on a scheduled basis Oxycodone - This is a strong narcotic, to be used only on an "as needed" basis for SEVERE pain. Zofran -  take as needed for nausea   FOLLOW-UP If you develop a Fever (>101.5), Redness or Drainage from the surgical incision site, please call our office to arrange for an evaluation. Please call the office to schedule a follow-up appointment for a wound check, 7-10 days post-operatively.  IF YOU HAVE ANY QUESTIONS, PLEASE FEEL FREE TO CALL OUR OFFICE.  HELPFUL INFORMATION  If you had a block, it will wear off between 8-24 hrs postop typically.  This is period when your pain may go from nearly zero to the pain you would have had post-op without the block.  This is an abrupt transition but nothing dangerous is happening.  You may take an extra dose of narcotic when this happens.  You may be more comfortable sleeping in a semi-seated position the first few nights following surgery.  Keep a pillow propped under the elbow and forearm for comfort.  If you have a recliner type of chair it might be beneficial.  If not that is fine too, but it would be helpful to sleep propped up with pillows behind your operated shoulder as well under your elbow and forearm.  This will reduce pulling on the suture lines.  When dressing, put your operative arm in the sleeve first.  When getting undressed, take your operative arm out last.  Loose fitting, button-down  shirts are recommended.  In most states it is against the law to drive while your arm is in a sling. And certainly against the law to drive while taking narcotics.  You may return to work/school in the next couple of days when you feel up to it. Desk work and typing is okay  We suggest you use the pain medication the first night prior to going to bed, in order to ease any pain when the anesthesia wears off. You should avoid taking pain medications on an empty stomach as it will make you nauseous.  Do not drink alcoholic beverages or take illicit drugs when taking pain medications.  Pain medication may make you constipated.  Below are a few solutions to try in this order: Decrease the amount of pain medication if you aren't having pain. Drink lots of decaffeinated fluids. Drink prune juice and/or each dried prunes  If the first 3 don't work start with additional solutions Take Colace - an over-the-counter stool softener Take Senokot - an over-the-counter laxative Take Miralax - a stronger over-the-counter laxative    For more information including helpful videos and documents visit our website:   [email or web address]    Post Anesthesia Home Care Instructions  Activity: Get plenty of rest for the remainder of the day. A responsible individual must stay with you for 24 hours following the procedure.  For the next 24 hours, DO NOT: -Drive a car -Paediatric nurse -Drink alcoholic beverages -Take any medication unless instructed by your physician -Make any legal decisions or sign important papers.  Meals: Start with liquid foods such as gelatin or soup. Progress to regular foods as tolerated. Avoid greasy, spicy, heavy foods. If nausea and/or vomiting occur, drink only clear liquids until the nausea and/or vomiting subsides. Call your physician if vomiting continues.  Special Instructions/Symptoms: Your throat may feel dry or sore from the  anesthesia or the breathing tube placed in your throat during surgery. If this causes discomfort, gargle with warm salt water. The discomfort should disappear within 24 hours.  If you had a scopolamine patch placed behind your ear for the management of post- operative nausea and/or vomiting:  1. The medication in the patch is effective for 72 hours, after which it should be removed.  Wrap patch in a tissue and discard in the trash. Wash hands thoroughly with soap and water. 2. You may remove the patch earlier than 72 hours if you experience unpleasant side effects which may include dry mouth, dizziness or visual disturbances. 3. Avoid touching the patch. Wash your hands with soap and water after contact with the patch.

## 2021-05-11 NOTE — Anesthesia Preprocedure Evaluation (Signed)
Anesthesia Evaluation  Patient identified by MRN, date of birth, ID band Patient awake    Reviewed: Allergy & Precautions, NPO status , Patient's Chart, lab work & pertinent test results  Airway Mallampati: III       Dental no notable dental hx.    Pulmonary neg pulmonary ROS,    Pulmonary exam normal        Cardiovascular hypertension, Pt. on medications Normal cardiovascular exam     Neuro/Psych negative neurological ROS  negative psych ROS   GI/Hepatic negative GI ROS, Neg liver ROS,   Endo/Other  diabetes, Well Controlled, Type 2, Oral Hypoglycemic Agents  Renal/GU negative Renal ROS  negative genitourinary   Musculoskeletal   Abdominal (+) + obese,   Peds  Hematology   Anesthesia Other Findings   Reproductive/Obstetrics negative OB ROS                             Anesthesia Physical Anesthesia Plan  ASA: 2  Anesthesia Plan: General   Post-op Pain Management:    Induction: Intravenous  PONV Risk Score and Plan: 4 or greater and Ondansetron, Dexamethasone and Midazolam  Airway Management Planned: LMA  Additional Equipment: None  Intra-op Plan:   Post-operative Plan: Extubation in OR  Informed Consent: I have reviewed the patients History and Physical, chart, labs and discussed the procedure including the risks, benefits and alternatives for the proposed anesthesia with the patient or authorized representative who has indicated his/her understanding and acceptance.     Dental advisory given  Plan Discussed with: CRNA  Anesthesia Plan Comments:         Anesthesia Quick Evaluation

## 2021-05-11 NOTE — Interval H&P Note (Signed)
History and Physical Interval Note:  05/11/2021 11:46 AM  Lynn Moody  has presented today for surgery, with the diagnosis of left orif elbow,injury to radial nerve.  The various methods of treatment have been discussed with the patient and family. After consideration of risks, benefits and other options for treatment, the patient has consented to  Procedure(s): OPEN REDUCTION INTERNAL FIXATION DISTAL HUMERUS FRACTURE (Left) SUPERFICIAL PERONEAL NERVE RELEASE (Left) as a surgical intervention.  The patient's history has been reviewed, patient examined, no change in status, stable for surgery.  I have reviewed the patient's chart and labs.  Questions were answered to the patient's satisfaction.     Hiram Gash

## 2021-05-14 ENCOUNTER — Encounter (HOSPITAL_BASED_OUTPATIENT_CLINIC_OR_DEPARTMENT_OTHER): Payer: Self-pay | Admitting: Orthopaedic Surgery

## 2021-05-17 NOTE — Op Note (Signed)
Orthopaedic Surgery Operative Note (CSN: [id number])  Syliva Overman L Pilat  [birth year] Date of Surgery: 05/11/2021   Diagnoses:  Left supracondylar humerus fracture  Procedure: Left extraarticular distal humerus ORIF Left radial nerve neuroplasty   Operative Finding Successful completion of the planned procedure.  Bone quality was good, we had to good lag screws and anatomic reduction.  The plate did not fit very well and we attempted to contour it but due to the robust nature of the plate it was difficult to contour perfectly.  We were able to pull the plate down to bone reasonably with a nonlocking screw that was eventually switched to a locking screw distally.  Post-operative plan: The patient will be weightbearing up to 5 pounds with advancement as tolerated after first visit.  The patient will be discharged home.  DVT prophylaxis not indicated in this ambulatory upper extremity patient without significant risk factors.   Pain control with PRN pain medication preferring oral medicines.  Follow up plan will be scheduled in approximately 7 days for incision check and XR.  Post-Op Diagnosis: Same Surgeons:Primary: Hiram Gash, MD Assistants:Caroline McBane PA-C Location: [place] OR ROOM 6 Anesthesia: General with local anesthesia Antibiotics: Ancef 2 g with local vancomycin powder 1 g at the surgical site Tourniquet time: none* Estimated Blood Loss: 50 m Complications: None Specimens: None Implants: Implant Name Type Inv. Item Serial No. Manufacturer Lot No. LRB No. Used Action  PLATE EXTRA ARTICULAR 8H - [id number] Plate PLATE EXTRA ARTICULAR 8H  DEPUY [employer]TH[employer] ON STERILE TRAY Left 1 Implanted  SCREW LOCKING 3.5X16 - [id number] Screw SCREW LOCKING 3.5X16  DEPUY ORTHOPAED[employer] ON STERILE TRAY Left 1 Implanted  SCREW LOCKING 3.5X24 - [id number] Screw SCREW LOCKING 3.5X24  DEPUY ORTHOPAED[employer] ON STERILE TRAY Left 1 Implanted  SCREW LOCKING 3.5X26 - [id number] Screw SCREW LOCKING 3.5X26  DEPUY  ORTHOPAEDICS ON STERILE TRAY Left 1 Implanted  SCREW CORTEX 2.7X20MM - [id number] Screw SCREW CORTEX 2.7X20MM  DEPUY ORTHOPAED[employer] ON STERILE TRAY Left 1 Implanted  SCREW CORTICAL 2.7X24MM - [id number] Screw SCREW CORTICAL 2.7X24MM  DEPUY ORTHOPAED[employer] ON STERILE TRAY Left 1 Implanted  SCREW CORTEX 3.5 22MM - [id number] Screw SCREW CORTEX 3.5 22MM  DEPUY ORTHOPAEDICS ON STERILE TRAY Left 3 Implanted    Indications for Surgery:   LAURITA PERON is a 50 y.o. female with fall resulting in a extra-articular distal humerus fracture with significant proximal extension.  Benefits and risks of operative and nonoperative management were discussed prior to surgery with patient/guardian(s) and informed consent form was completed.  Specific risks including infection, need for additional surgery, nonunion, malunion, radial nerve palsy and elbow stiffness amongst others.   Procedure:   The patient was identified properly. Informed consent was obtained and the surgical site was marked. The patient was taken up to suite where general anesthesia was induced.  The patient was positioned lateral on a beanbag with arm over a bolster.  The left elbow was prepped and draped in the usual sterile fashion.  Timeout was performed before the beginning of the case.  We began by marking our bony landmarks and made a posterior longitudinal excision over the triceps itself.  We identified the triceps fascia and open this.  The triceps tendon was identified distally and we stayed on the lateral aspect of the triceps using a pair tricipital approach throughout the case.    It was a long oblique fracture with a spiral component that went up to the level of the radial nerve.  The fracture itself  was tenting the radial nerve bundle.  We took care to keep pressure off the nerve bundle.  We identified the neurovascular bundle in the form of the radial nerve and radial artery neurolysed these from the lateral septum around the posterior  aspect of the humerus placing a Penrose drain with a tag stitch to identify this bundle later and mobilize it.   We able to carefully hold the fracture anatomically reduced with a series of fracture reduction forceps and placed 2 2.7 mm lag screws by technique.  The radial nerve and artery were protected with a blunt retractor throughout this.   We then used fluoroscopy to select an [employer] extra-articular distal humerus plate and centered on the posterior aspect of the humerus and on the lateral column of the distal humerus.  The plate was carefully slid underneath the bundle to avoid compression of the structure.  The bundle in its final position laid between the first and the second screw when counting from the proximal aspect of the plate.  Plate fixed with non locking screws 3 proximal to fracture and a combination of 4 locking and non-locking screws distally.   We had great fixation at the end of the case and took final fluoroscopic images prior to irrigating copiously.  We then placed local vancomycin powder.   The lateral paratriciptal window was closed taking care to avoid binding the neurovascular bundle still closing dead space.    We irrigated the wound copiously before placing local antibiotic as listed above.  We closed the incision in a multilayer fashion with absorbable suture.  Sterile dressing was placed.  Patient was awoken taken to PACU in stable condition.  Noemi Chapel, PA-C, present and scrubbed throughout the case, critical for completion in a timely fashion, and for retraction, instrumentation, closure.

## 2021-06-01 ENCOUNTER — Other Ambulatory Visit: Payer: Self-pay

## 2021-06-01 ENCOUNTER — Ambulatory Visit: Payer: Medicaid Other | Attending: Orthopaedic Surgery | Admitting: Rehabilitative and Restorative Service Providers"

## 2021-06-01 ENCOUNTER — Encounter: Payer: Self-pay | Admitting: Rehabilitative and Restorative Service Providers"

## 2021-06-01 DIAGNOSIS — M25512 Pain in left shoulder: Secondary | ICD-10-CM | POA: Diagnosis present

## 2021-06-01 DIAGNOSIS — M6281 Muscle weakness (generalized): Secondary | ICD-10-CM | POA: Diagnosis present

## 2021-06-01 DIAGNOSIS — M25622 Stiffness of left elbow, not elsewhere classified: Secondary | ICD-10-CM | POA: Insufficient documentation

## 2021-06-01 DIAGNOSIS — M25612 Stiffness of left shoulder, not elsewhere classified: Secondary | ICD-10-CM | POA: Diagnosis not present

## 2021-06-01 DIAGNOSIS — R293 Abnormal posture: Secondary | ICD-10-CM | POA: Insufficient documentation

## 2021-06-01 DIAGNOSIS — R252 Cramp and spasm: Secondary | ICD-10-CM | POA: Diagnosis present

## 2021-06-01 NOTE — Therapy (Signed)
[address] [place], [place], [address] Phone: [phone]   Fax:  [phone]  Physical Therapy Evaluation  Patient Details  Name: [place] MRN: [id number] Date of Birth: 1971-04-03 Referring Provider (PT): Dr Ophelia Charter   Encounter Date: 06/01/2021   PT End of Session - 06/01/21 0937     Visit Number 1    Number of Visits 27    Date for PT Re-Evaluation 07/21/21    Authorization Type Medicaid [employer]    PT Start Time 0850    PT Stop Time 0930    PT Time Calculation (min) 40 min    Activity Tolerance Patient tolerated treatment well    Behavior During Therapy [facility] for tasks assessed/performed             Past Medical History:  Diagnosis Date   Anemia    Diabetes mellitus without complication ([place])    Hypertension     Past Surgical History:  Procedure Laterality Date   BREAST BIOPSY     left breast with maker placed   CESAREAN SECTION     DENTAL SURGERY     ORIF ELBOW FRACTURE Left 05/11/2021   Procedure: OPEN REDUCTION INTERNAL FIXATION DISTAL HUMERUS FRACTURE;  Surgeon: Hiram Gash, MD;  Location: [place];  Service: Orthopedics;  Laterality: Left;   ULNAR NERVE TRANSPOSITION Left 05/11/2021   Procedure: RADIAL  NERVE DECOMPRESSION;  Surgeon: Hiram Gash, MD;  Location: [facility];  Service: Orthopedics;  Laterality: Left;    There were no vitals filed for this visit.    Subjective Assessment - 06/01/21 0855     Subjective Pt reports that she fell into a wall on 6/13 and sustained a L distal humerus Fx.  She underwent surgical repair with an ORIF with radial nerve decompression on 05/11/21 by Dr Ophelia Charter.  She presents in a sling on her LUE. Pts PLOF was independent with ADLs and IADLs.    Pertinent History DMII, previous L shoulder accident    Patient Stated Goals To be able to move my hand back to normal again.    Currently in Pain? Yes    Pain Score  5     Pain Location Arm    Pain Orientation Left    Pain Descriptors / Indicators Sharp;Tingling    Pain Type Surgical pain                [facility] PT Assessment - 06/01/21 0001       Assessment   Medical Diagnosis s/p L distal humerus ORIF with radial nerve decompression    Referring Provider (PT) Dr Ophelia Charter    Onset Date/Surgical Date 05/11/21    Hand Dominance Right    Next MD Visit early August    Prior Therapy no      Precautions   Precaution Comments No lifting greater than 2 pounds      Balance Screen   Has the patient fallen in the past 6 months Yes    How many times? 1    Has the patient had a decrease in activity level because of a fear of falling?  Yes    Is the patient reluctant to leave their home because of a fear of falling?  No      Home Environment   Living Environment Private residence    Living Arrangements Spouse/significant other;Children   10, 52, and 58 year old children  Type of Home Apartment    Home Access Stairs to enter   3rd floor apartment   Home Layout One level      Prior Function   Level of Independence Independent    Vocation Part time employment    Public librarian, and labs    Leisure baking, travel      Cognition   Overall Cognitive Status Within Functional Limits for tasks assessed      Observation/Other Assessments   Focus on Therapeutic Outcomes (FOTO)  13%, Risk adjusted 28%      ROM / Strength   AROM / PROM / Strength AROM;PROM;Strength      AROM   AROM Assessment Site Shoulder;Elbow;Forearm    Right/Left Shoulder Left    Left Shoulder Flexion 101 Degrees    Left Shoulder ABduction 109 Degrees    Right/Left Elbow Left    Left Elbow Flexion 111    Left Elbow Extension -33    Right/Left Forearm Left    Left Forearm Pronation 70 Degrees    Left Forearm Supination 70 Degrees      Strength   Strength Assessment Site Shoulder;Elbow;Hand    Right/Left Shoulder Left    Left Shoulder Flexion 3-/5     Left Shoulder Extension 3-/5    Left Shoulder ABduction 3-/5    Right/Left Elbow Left    Left Elbow Flexion 3/5    Left Elbow Extension 3-/5    Right/Left hand Right;Left    Right Hand Grip (lbs) 44    Left Hand Grip (lbs) 14                        Objective measurements completed on examination: See above findings.               PT Education - 06/01/21 0929     Education Details Pt provided with HEP    Person(s) Educated Patient    Methods Explanation;Handout;Demonstration    Comprehension Verbalized understanding;Returned demonstration;Tactile cues required              PT Short Term Goals - 06/01/21 1333       PT SHORT TERM GOAL #1   Title Pt will be independent with initial HEP.    Time 2    Period Weeks    Status New               PT Long Term Goals - 06/01/21 1334       PT LONG TERM GOAL #1   Title Pt will be independent with advanced HEP.    Time 8    Period Weeks    Status New      PT LONG TERM GOAL #2   Title Patient will increase LUE AROM to [facility] to allow her to perform functional activities around home.    Time 8    Period Weeks    Status New      PT LONG TERM GOAL #3   Title Patient will increase LUE strength to at least 4+/5 to allow her to perform tasks in her home.    Time 8    Period Weeks    Status New      PT LONG TERM GOAL #4   Title Patient will increase L grip strength to within 10 pounds ot R grip strength to allow her to open containers.    Time 8    Period Weeks    Status New  Plan - 06/01/21 0944     Clinical Impression Statement [name] presents to PT with a referral from Dr Ophelia Charter s/p L distal humerus ORIF with radial nerve decompression on 05/11/21.  She reports that she fell on 6/13 and sustained the L distal humerus Fx, but she admits that prior to the fall, she was having some L shoulder pain from a previous injury.  She presents with orders to progress  ROM as able and restrict lifting on LUE to no more than 2 pounds with a MD follow up scheduled in approximately 4 weeks.  Pt presents with decreased LUE ROM, decreased strength, increased pain, and decreased functional use of LUE. Patient would benefit from skilled PT to address her functional impariments to allow her to return to her independent lifestyle.    Personal Factors and Comorbidities Fitness    Examination-Activity Limitations Reach Overhead;Dressing;Hygiene/Grooming;Carry    Examination-Participation Restrictions Occupation    Stability/Clinical Decision Making Stable/Uncomplicated    Clinical Decision Making Low    Rehab Potential Good    PT Frequency 2x / week    PT Duration 8 weeks    PT Treatment/Interventions ADLs/Self Care Home Management;Cryotherapy;Electrical Stimulation;Iontophoresis 4mg /ml Dexamethasone;Moist Heat;Ultrasound;Functional mobility training;Therapeutic activities;Therapeutic exercise;Neuromuscular re-education;Patient/family education;Manual techniques;Scar mobilization;Passive range of motion;Dry needling;Taping;Joint Manipulations    PT Next Visit Plan progress ROM    PT Home Exercise Plan Access Code: [email or web address]             Patient will benefit from skilled therapeutic intervention in order to improve the following deficits and impairments:  Decreased range of motion, Increased muscle spasms, Impaired UE functional use, Decreased activity tolerance, Pain, Decreased strength, Postural dysfunction  Visit Diagnosis: Stiffness of left shoulder, not elsewhere classified - Plan: PT plan of care cert/re-cert  Acute pain of left shoulder - Plan: PT plan of care cert/re-cert  Stiffness of left elbow, not elsewhere classified - Plan: PT plan of care cert/re-cert  Muscle weakness (generalized) - Plan: PT plan of care cert/re-cert  Abnormal posture - Plan: PT plan of care cert/re-cert  Cramp and spasm - Plan: PT plan of care cert/re-cert     Problem  List Patient Active Problem List   Diagnosis Date Noted   History of miscarriage 08/19/2015   Elevated WBC count 05/18/2015   Pneumococcal vaccination declined 03/15/2015   Diabetes mellitus, type 2 ([place]) 12/23/2014   IMPAIRED GLUCOSE TOLERANCE TEST 10/13/2010    Juel Burrow, PT, DPT 06/01/2021, 1:39 PM  [address] [place], [place], [address] Phone: [phone]   Fax:  [phone]  Name: NAYAB ATEN MRN: [id number] Date of Birth: 1971/08/14

## 2021-06-01 NOTE — Patient Instructions (Signed)
Access Code: [email or web address] URL: [email or web address] Date: 06/01/2021 Prepared by: Shelby Dubin Breta Demedeiros  Exercises Seated Gripping Towel - 1 x daily - 7 x weekly - 3 sets - 10 reps Supine Shoulder Flexion Extension Full Range AROM - 2 x daily - 7 x weekly - 2 sets - 10 reps Seated Single Arm Bicep Curls with Rotation and Dumbbell - 2 x daily - 7 x weekly - 2 sets - 10 reps Shoulder Flexion Wall Slide with Towel - 2 x daily - 7 x weekly - 2 sets - 10 reps Standing Shoulder Abduction Slides at Wall - 2 x daily - 7 x weekly - 2 sets - 10 reps Radial Nerve Mobilization - 1 x daily - 7 x weekly - 2 sets - 10 reps

## 2021-06-13 ENCOUNTER — Ambulatory Visit: Payer: Medicaid Other | Admitting: Rehabilitative and Restorative Service Providers"

## 2021-06-13 ENCOUNTER — Other Ambulatory Visit: Payer: Self-pay

## 2021-06-13 ENCOUNTER — Encounter: Payer: Self-pay | Admitting: Rehabilitative and Restorative Service Providers"

## 2021-06-13 DIAGNOSIS — M6281 Muscle weakness (generalized): Secondary | ICD-10-CM

## 2021-06-13 DIAGNOSIS — M25512 Pain in left shoulder: Secondary | ICD-10-CM

## 2021-06-13 DIAGNOSIS — M25612 Stiffness of left shoulder, not elsewhere classified: Secondary | ICD-10-CM | POA: Diagnosis not present

## 2021-06-13 DIAGNOSIS — R293 Abnormal posture: Secondary | ICD-10-CM

## 2021-06-13 DIAGNOSIS — M25622 Stiffness of left elbow, not elsewhere classified: Secondary | ICD-10-CM

## 2021-06-13 DIAGNOSIS — R252 Cramp and spasm: Secondary | ICD-10-CM

## 2021-06-13 NOTE — Therapy (Signed)
[place] [place], [place], [address] Phone: [phone]   Fax:  [phone]  Physical Therapy Treatment  Patient Details  Name: [place] MRN: [id number] Date of Birth: Dec 09, 1970 Referring Provider (PT): Dr Ophelia Charter   Encounter Date: 06/13/2021   PT End of Session - 06/13/21 0856     Visit Number 2    Number of Visits 27    Date for PT Re-Evaluation 07/21/21    Authorization Type Medicaid [employer]    PT Start Time 0850    PT Stop Time 0935    PT Time Calculation (min) 45 min    Activity Tolerance Patient tolerated treatment well    Behavior During Therapy [place] for tasks assessed/performed             Past Medical History:  Diagnosis Date   Anemia    Diabetes mellitus without complication ([place])    Hypertension     Past Surgical History:  Procedure Laterality Date   BREAST BIOPSY     left breast with maker placed   CESAREAN SECTION     DENTAL [place]     ORIF ELBOW FRACTURE Left 05/11/2021   Procedure: OPEN REDUCTION INTERNAL FIXATION DISTAL HUMERUS FRACTURE;  Surgeon: Hiram Gash, MD;  Location: [place];  Service: Orthopedics;  Laterality: Left;   ULNAR NERVE TRANSPOSITION Left 05/11/2021   Procedure: RADIAL  NERVE DECOMPRESSION;  Surgeon: Hiram Gash, MD;  Location: [place];  Service: Orthopedics;  Laterality: Left;    There were no vitals filed for this visit.   Subjective Assessment - 06/13/21 0854     Subjective My scar area is a little sore this morning.  I go see Dr Griffin Basil later today to have him look at my shoulder.  I admit, I did not do my exercises on vacation as much as I should have.    Pertinent History DMII, previous L shoulder accident    Patient Stated Goals To be able to move my hand back to normal again.    Currently in Pain? Yes    Pain Score 3     Pain Location Arm    Pain Orientation Left    Pain Descriptors / Indicators  Sharp;Tingling    Pain Type Surgical pain                [facility] PT Assessment - 06/13/21 0001       AROM   Left Shoulder Flexion 125 Degrees    Left Elbow Extension -15                           [facility] Adult PT Treatment/Exercise - 06/13/21 0001       Exercises   Exercises Shoulder      Shoulder Exercises: Supine   Flexion AAROM;Both;12 reps    Shoulder Flexion Weight (lbs) 1 lb WaTe bar    ABduction AAROM;Left;12 reps    Shoulder ABduction Weight (lbs) 1 lb WaTe bar    Other Supine Exercises Bicep curls LUE 1# 2x10    Other Supine Exercises L hand stress ball squeeze:  2x10      Shoulder Exercises: Standing   Other Standing Exercises Finger Ladder: flexion and abd x10 each      Shoulder Exercises: ROM/Strengthening   UBE (Upper Arm Bike) L1.0 x3 min each way      Modalities   Modalities Cryotherapy  Cryotherapy   Number Minutes Cryotherapy 5 Minutes    Cryotherapy Location Shoulder    Type of Cryotherapy Ice pack      Manual Therapy   Manual Therapy Joint mobilization;Soft tissue mobilization;Passive ROM    Manual therapy comments supine    Joint Mobilization gentle grade 1/2 to shoulder for abduction    Soft tissue mobilization to distal bicep region    Passive ROM To elbow and shoulder in all range of motion.   pt with empty end feel of pain.                     PT Short Term Goals - 06/13/21 0940       PT SHORT TERM GOAL #1   Title Pt will be independent with initial HEP.    Status On-going               PT Long Term Goals - 06/13/21 0940       PT LONG TERM GOAL #2   Title Patient will increase LUE AROM to [facility] to allow her to perform functional activities around home.    Status On-going      PT LONG TERM GOAL #3   Title Patient will increase LUE strength to at least 4+/5 to allow her to perform tasks in her home.    Status On-going      PT LONG TERM GOAL #4   Title Patient will increase L grip strength  to within 10 pounds ot R grip strength to allow her to open containers.    Status On-going                   Plan - 06/13/21 0932     Clinical Impression Statement [name] tolerated TE well and is progressing with elbow extension and shoulder flexion ROM.  She continues to have difficult with shoulder abduction with increased pain noted.  Pt to be seen by Dr Griffin Basil today for follow up and to assess L shoulder to ensure no damage was done to her shoulder secondary to her continuing to have increased pain.  She reports that cold pack helped with decreasing pain at the end of PT session.    PT Treatment/Interventions ADLs/Self Care Home Management;Cryotherapy;Electrical Stimulation;Iontophoresis 4mg /ml Dexamethasone;Moist Heat;Ultrasound;Functional mobility training;Therapeutic activities;Therapeutic exercise;Neuromuscular re-education;Patient/family education;Manual techniques;Scar mobilization;Passive range of motion;Dry needling;Taping;Joint Manipulations    PT Next Visit Plan progress ROM    Consulted and Agree with Plan of Care Patient             Patient will benefit from skilled therapeutic intervention in order to improve the following deficits and impairments:  Decreased range of motion, Increased muscle spasms, Impaired UE functional use, Decreased activity tolerance, Pain, Decreased strength, Postural dysfunction  Visit Diagnosis: Stiffness of left shoulder, not elsewhere classified  Acute pain of left shoulder  Stiffness of left elbow, not elsewhere classified  Muscle weakness (generalized)  Abnormal posture  Cramp and spasm     Problem List Patient Active Problem List   Diagnosis Date Noted   History of miscarriage 08/19/2015   Elevated WBC count 05/18/2015   Pneumococcal vaccination declined 03/15/2015   Diabetes mellitus, type 2 ([place]) 12/23/2014   IMPAIRED GLUCOSE TOLERANCE TEST 10/13/2010    Juel Burrow, PT, DPT 06/13/2021, 9:42 AM  [place]. [place], [place], [address] Phone: [phone]   Fax:  [phone]  Name: [place] MRN: [id number] Date  of Birth: Jun 16, 1971

## 2021-06-15 ENCOUNTER — Other Ambulatory Visit: Payer: Self-pay

## 2021-06-15 ENCOUNTER — Encounter: Payer: Self-pay | Admitting: Rehabilitative and Restorative Service Providers"

## 2021-06-15 ENCOUNTER — Ambulatory Visit: Payer: Medicaid Other | Admitting: Rehabilitative and Restorative Service Providers"

## 2021-06-15 DIAGNOSIS — M25612 Stiffness of left shoulder, not elsewhere classified: Secondary | ICD-10-CM

## 2021-06-15 DIAGNOSIS — M25622 Stiffness of left elbow, not elsewhere classified: Secondary | ICD-10-CM

## 2021-06-15 DIAGNOSIS — R293 Abnormal posture: Secondary | ICD-10-CM

## 2021-06-15 DIAGNOSIS — M25512 Pain in left shoulder: Secondary | ICD-10-CM

## 2021-06-15 DIAGNOSIS — M6281 Muscle weakness (generalized): Secondary | ICD-10-CM

## 2021-06-15 NOTE — Therapy (Signed)
[place]. [place], [place], [address] Phone: [phone]   Fax:  [phone]  Physical Therapy Treatment  Patient Details  Name: Lynn Moody MRN: [id number] Date of Birth: 05/15/1971 Referring Provider (PT): Dr Ophelia Charter   Encounter Date: 06/15/2021   PT End of Session - 06/15/21 1031     Visit Number 3    Number of Visits 27    Date for PT Re-Evaluation 07/21/21    Authorization Type Medicaid [employer]    PT Start Time 1024    PT Stop Time 1102    PT Time Calculation (min) 38 min    Activity Tolerance Patient tolerated treatment well    Behavior During Therapy [facility] for tasks assessed/performed             Past Medical History:  Diagnosis Date   Anemia    Diabetes mellitus without complication ([place])    Hypertension     Past Surgical History:  Procedure Laterality Date   BREAST BIOPSY     left breast with maker placed   CESAREAN SECTION     DENTAL SURGERY     ORIF ELBOW FRACTURE Left 05/11/2021   Procedure: OPEN REDUCTION INTERNAL FIXATION DISTAL HUMERUS FRACTURE;  Surgeon: Hiram Gash, MD;  Location: [place];  Service: Orthopedics;  Laterality: Left;   ULNAR NERVE TRANSPOSITION Left 05/11/2021   Procedure: RADIAL  NERVE DECOMPRESSION;  Surgeon: Hiram Gash, MD;  Location: [place];  Service: Orthopedics;  Laterality: Left;    There were no vitals filed for this visit.   Subjective Assessment - 06/15/21 1029     Subjective I just took my pain medication just now, my arm was hurting.    Patient Stated Goals To be able to move my hand back to normal again.    Currently in Pain? Yes    Pain Score 5     Pain Location Arm    Pain Orientation Left                               [facility] Adult PT Treatment/Exercise - 06/15/21 0001       Shoulder Exercises: Supine   Flexion AAROM;Both;12 reps   2x12   Shoulder Flexion Weight (lbs) 1 lb  WaTE bar    ABduction AAROM;Left;12 reps   2x12   Shoulder ABduction Weight (lbs) 1 lb WaTE bar    Other Supine Exercises Bicep curls LUE 1# 2x10    Other Supine Exercises L hand stress ball squeeze:  2x10      Shoulder Exercises: Standing   Other Standing Exercises Finger Ladder: flexion and abd x10 each      Shoulder Exercises: ROM/Strengthening   UBE (Upper Arm Bike) L1.0 x3 min each way      Manual Therapy   Manual Therapy Joint mobilization;Soft tissue mobilization;Passive ROM    Manual therapy comments supine    Joint Mobilization gentle grade 1/2 to shoulder for abduction    Soft tissue mobilization to distal bicep region    Passive ROM To elbow and shoulder in all range of motion.                      PT Short Term Goals - 06/15/21 1109       PT SHORT TERM GOAL #1   Title Pt will be independent with initial HEP.  Status Achieved               PT Long Term Goals - 06/13/21 0940       PT LONG TERM GOAL #2   Title Patient will increase LUE AROM to [facility] to allow her to perform functional activities around home.    Status On-going      PT LONG TERM GOAL #3   Title Patient will increase LUE strength to at least 4+/5 to allow her to perform tasks in her home.    Status On-going      PT LONG TERM GOAL #4   Title Patient will increase L grip strength to within 10 pounds ot R grip strength to allow her to open containers.    Status On-going                   Plan - 06/15/21 1107     Clinical Impression Statement [name] arrived late for her appointment today secondary to going back home to take a pain pill prior to therapy session.  She is progressing with ROM and having decreased difficulty.  She states that Dr Griffin Basil is going to see her again tomorrow to review her old shoulder MRI with her and determine if a new MRI is needed for her L shoulder.  She is progressing with increased reps on AAROM today.  She continues to require skilled PT  to progress towards goal related activities.    PT Treatment/Interventions ADLs/Self Care Home Management;Cryotherapy;Electrical Stimulation;Iontophoresis 4mg /ml Dexamethasone;Moist Heat;Ultrasound;Functional mobility training;Therapeutic activities;Therapeutic exercise;Neuromuscular re-education;Patient/family education;Manual techniques;Scar mobilization;Passive range of motion;Dry needling;Taping;Joint Manipulations    PT Next Visit Plan progress ROM    Consulted and Agree with Plan of Care Patient             Patient will benefit from skilled therapeutic intervention in order to improve the following deficits and impairments:  Decreased range of motion, Increased muscle spasms, Impaired UE functional use, Decreased activity tolerance, Pain, Decreased strength, Postural dysfunction  Visit Diagnosis: Stiffness of left shoulder, not elsewhere classified  Stiffness of left elbow, not elsewhere classified  Acute pain of left shoulder  Muscle weakness (generalized)  Abnormal posture     Problem List Patient Active Problem List   Diagnosis Date Noted   History of miscarriage 08/19/2015   Elevated WBC count 05/18/2015   Pneumococcal vaccination declined 03/15/2015   Diabetes mellitus, type 2 ([place]) 12/23/2014   IMPAIRED GLUCOSE TOLERANCE TEST 10/13/2010    Juel Burrow, PT, DPT 06/15/2021, 11:10 AM  [place]. [place], [place], [address] Phone: [phone]   Fax:  [phone]  Name: Lynn [place] MRN: [id number] Date of Birth: 1971/09/03

## 2021-06-16 ENCOUNTER — Ambulatory Visit: Payer: Medicaid Other | Admitting: Rehabilitative and Restorative Service Providers"

## 2021-06-20 ENCOUNTER — Ambulatory Visit: Payer: Medicaid Other | Admitting: Rehabilitative and Restorative Service Providers"

## 2021-06-20 ENCOUNTER — Encounter: Payer: Self-pay | Admitting: Rehabilitative and Restorative Service Providers"

## 2021-06-20 ENCOUNTER — Other Ambulatory Visit: Payer: Self-pay

## 2021-06-20 DIAGNOSIS — R293 Abnormal posture: Secondary | ICD-10-CM

## 2021-06-20 DIAGNOSIS — M25612 Stiffness of left shoulder, not elsewhere classified: Secondary | ICD-10-CM

## 2021-06-20 DIAGNOSIS — M25622 Stiffness of left elbow, not elsewhere classified: Secondary | ICD-10-CM

## 2021-06-20 DIAGNOSIS — M25512 Pain in left shoulder: Secondary | ICD-10-CM

## 2021-06-20 DIAGNOSIS — M6281 Muscle weakness (generalized): Secondary | ICD-10-CM

## 2021-06-20 DIAGNOSIS — R252 Cramp and spasm: Secondary | ICD-10-CM

## 2021-06-20 NOTE — Therapy (Signed)
[place]. [place], [place], [address] Phone: [phone]   Fax:  [phone]  Physical Therapy Treatment  Patient Details  Name: Lynn Moody MRN: [id number] Date of Birth: 01-21-1971 Referring Provider (PT): Dr Ophelia Charter   Encounter Date: 06/20/2021   PT End of Session - 06/20/21 0952     Visit Number 4    Number of Visits 27    Date for PT Re-Evaluation 07/21/21    Authorization Type Medicaid [employer]    PT Start Time [phone]    PT Stop Time 1025    PT Time Calculation (min) 43 min    Activity Tolerance Patient tolerated treatment well    Behavior During Therapy [facility] for tasks assessed/performed             Past Medical History:  Diagnosis Date   Anemia    Diabetes mellitus without complication ([place])    Hypertension     Past Surgical History:  Procedure Laterality Date   BREAST BIOPSY     left breast with maker placed   CESAREAN SECTION     DENTAL SURGERY     ORIF ELBOW FRACTURE Left 05/11/2021   Procedure: OPEN REDUCTION INTERNAL FIXATION DISTAL HUMERUS FRACTURE;  Surgeon: Hiram Gash, MD;  Location: [place];  Service: Orthopedics;  Laterality: Left;   ULNAR NERVE TRANSPOSITION Left 05/11/2021   Procedure: RADIAL  NERVE DECOMPRESSION;  Surgeon: Hiram Gash, MD;  Location: [place];  Service: Orthopedics;  Laterality: Left;    There were no vitals filed for this visit.   Subjective Assessment - 06/20/21 0947     Subjective Dr Griffin Basil said that my shoulder needs surgery.  I am scheduled for 06/28/21.  He wants me to come in Monday following surgery.    Diagnostic tests MRI 06/01/20:  Mild tendinosis with tenosynovitis of biceps, mild AC joint OA    Patient Stated Goals To be able to move my hand back to normal again.    Currently in Pain? Yes    Pain Score 2     Pain Location Arm    Pain Orientation Left                [facility] PT Assessment - 06/20/21  0001       AROM   Left Shoulder Flexion 130 Degrees    Left Shoulder ABduction 109 Degrees    Left Elbow Extension -10                           [facility] Adult PT Treatment/Exercise - 06/20/21 0001       Shoulder Exercises: Supine   Flexion AAROM;Both;12 reps   2x12   Shoulder Flexion Weight (lbs) 1 lb WaTE bar    ABduction AAROM;Left;12 reps   2x12   Shoulder ABduction Weight (lbs) 1 lb WaTE bar      Shoulder Exercises: Standing   Other Standing Exercises Finger Ladder: flexion and abd x10 each      Shoulder Exercises: ROM/Strengthening   UBE (Upper Arm Bike) L1.0 x3 min each way      Modalities   Modalities Vasopneumatic      Vasopneumatic   Number Minutes Vasopneumatic  10 minutes    Vasopnuematic Location  Shoulder    Vasopneumatic Pressure Low    Vasopneumatic Temperature  34      Manual Therapy   Manual Therapy Joint  mobilization;Soft tissue mobilization;Passive ROM    Manual therapy comments supine    Joint Mobilization gentle grade 1/2 to shoulder for abduction    Soft tissue mobilization to distal bicep region    Passive ROM To elbow and shoulder in all range of motion.                      PT Short Term Goals - 06/15/21 1109       PT SHORT TERM GOAL #1   Title Pt will be independent with initial HEP.    Status Achieved               PT Long Term Goals - 06/20/21 1040       PT LONG TERM GOAL #1   Title Pt will be independent with advanced HEP.    Status On-going      PT LONG TERM GOAL #2   Title Patient will increase LUE AROM to [facility] to allow her to perform functional activities around home.    Status On-going      PT LONG TERM GOAL #3   Title Patient will increase LUE strength to at least 4+/5 to allow her to perform tasks in her home.    Status On-going      PT LONG TERM GOAL #4   Title Patient will increase L grip strength to within 10 pounds ot R grip strength to allow her to open containers.    Status  On-going                   Plan - 06/20/21 1029     Clinical Impression Statement [name] arrived late for her appointment today for unknown reason.  She states that she will be having L shoulder distal clavical resection on 06/28/21.  She is progressing with increased ROM and is improving her elbow extension AROM each visit.  She will see PT one additional visit prior to her surgery, then is scheduled to have a reassessment the Monday following her surgical procedure with MD protocol in chart.  She will benefit from continued skilled PT to progress towards improved ROM and elbow strength prior to surgical procedure.    PT Treatment/Interventions ADLs/Self Care Home Management;Cryotherapy;Electrical Stimulation;Iontophoresis '4mg'$ /ml Dexamethasone;Moist Heat;Ultrasound;Functional mobility training;Therapeutic activities;Therapeutic exercise;Neuromuscular re-education;Patient/family education;Manual techniques;Scar mobilization;Passive range of motion;Dry needling;Taping;Joint Manipulations    PT Next Visit Plan progress ROM    Consulted and Agree with Plan of Care Patient             Patient will benefit from skilled therapeutic intervention in order to improve the following deficits and impairments:  Decreased range of motion, Increased muscle spasms, Impaired UE functional use, Decreased activity tolerance, Pain, Decreased strength, Postural dysfunction  Visit Diagnosis: Stiffness of left shoulder, not elsewhere classified  Stiffness of left elbow, not elsewhere classified  Acute pain of left shoulder  Muscle weakness (generalized)  Abnormal posture  Cramp and spasm     Problem List Patient Active Problem List   Diagnosis Date Noted   History of miscarriage 08/19/2015   Elevated WBC count 05/18/2015   Pneumococcal vaccination declined 03/15/2015   Diabetes mellitus, type 2 ([place]) 12/23/2014   IMPAIRED GLUCOSE TOLERANCE TEST 10/13/2010    Lynn Moody 06/20/2021,  10:41 AM  [place]. [place], [place], [address] Phone: [phone]   Fax:  [phone]  Name: Lynn [place] MRN: [id number] Date of Birth: [birth year]

## 2021-06-21 ENCOUNTER — Encounter (HOSPITAL_BASED_OUTPATIENT_CLINIC_OR_DEPARTMENT_OTHER): Payer: Self-pay | Admitting: Orthopaedic Surgery

## 2021-06-21 ENCOUNTER — Other Ambulatory Visit: Payer: Self-pay

## 2021-06-22 ENCOUNTER — Ambulatory Visit: Payer: Medicaid Other | Admitting: Rehabilitative and Restorative Service Providers"

## 2021-06-22 ENCOUNTER — Encounter: Payer: Self-pay | Admitting: Rehabilitative and Restorative Service Providers"

## 2021-06-22 DIAGNOSIS — M25622 Stiffness of left elbow, not elsewhere classified: Secondary | ICD-10-CM

## 2021-06-22 DIAGNOSIS — R252 Cramp and spasm: Secondary | ICD-10-CM

## 2021-06-22 DIAGNOSIS — M6281 Muscle weakness (generalized): Secondary | ICD-10-CM

## 2021-06-22 DIAGNOSIS — M25612 Stiffness of left shoulder, not elsewhere classified: Secondary | ICD-10-CM

## 2021-06-22 DIAGNOSIS — R293 Abnormal posture: Secondary | ICD-10-CM

## 2021-06-22 DIAGNOSIS — M25512 Pain in left shoulder: Secondary | ICD-10-CM

## 2021-06-22 NOTE — Therapy (Signed)
[place]. [place], [place], [address] Phone: [phone]   Fax:  [phone]  Physical Therapy Treatment  Patient Details  Name: Lynn Moody MRN: [id number] Date of Birth: [birth year] Referring Provider (PT): Dr Ophelia Charter   Encounter Date: 06/22/2021   PT End of Session - 06/22/21 0939     Visit Number 5    Number of Visits 27    Date for PT Re-Evaluation 07/21/21    Authorization Type Medicaid [employer]    PT Start Time 0937    PT Stop Time 1015    PT Time Calculation (min) 38 min    Activity Tolerance Patient tolerated treatment well    Behavior During Therapy [facility] for tasks assessed/performed             Past Medical History:  Diagnosis Date   Anemia    Diabetes mellitus without complication ([place])    Hypertension     Past Surgical History:  Procedure Laterality Date   BREAST BIOPSY     left breast with maker placed   CESAREAN SECTION     DENTAL SURGERY     ORIF ELBOW FRACTURE Left 05/11/2021   Procedure: OPEN REDUCTION INTERNAL FIXATION DISTAL HUMERUS FRACTURE;  Surgeon: Hiram Gash, MD;  Location: [place];  Service: Orthopedics;  Laterality: Left;   ULNAR NERVE TRANSPOSITION Left 05/11/2021   Procedure: RADIAL  NERVE DECOMPRESSION;  Surgeon: Hiram Gash, MD;  Location: [place];  Service: Orthopedics;  Laterality: Left;    There were no vitals filed for this visit.   Subjective Assessment - 06/22/21 0939     Subjective I am doing okay, just getting ready for surgery next week.    Patient Stated Goals To be able to move my hand back to normal again.    Currently in Pain? Yes    Pain Score 2     Pain Location Arm    Pain Orientation Left                [facility] PT Assessment - 06/22/21 0001       Strength   Left Hand Grip (lbs) 25 lbs                           [facility] Adult PT Treatment/Exercise - 06/22/21 0001        Shoulder Exercises: Supine   Flexion AAROM;Both;12 reps   2x12   Shoulder Flexion Weight (lbs) 1 lb WaTE bar    ABduction AAROM;Left;12 reps   2x12   Shoulder ABduction Weight (lbs) 1 lb WaTE bar    Other Supine Exercises Bicep curls LUE 2# 2x10      Shoulder Exercises: Standing   Other Standing Exercises Finger Ladder: flexion and abd x10 each      Shoulder Exercises: ROM/Strengthening   UBE (Upper Arm Bike) L1.0 x3 min each way      Manual Therapy   Manual Therapy Joint mobilization;Soft tissue mobilization;Passive ROM    Manual therapy comments seated    Joint Mobilization gentle grade 1/2 to shoulder for abduction    Soft tissue mobilization to distal bicep region and upper trap    Passive ROM To elbow and shoulder in all range of motion.                      PT Short Term Goals - 06/15/21 1109  PT SHORT TERM GOAL #1   Title Pt will be independent with initial HEP.    Status Achieved               PT Long Term Goals - 06/20/21 1040       PT LONG TERM GOAL #1   Title Pt will be independent with advanced HEP.    Status On-going      PT LONG TERM GOAL #2   Title Patient will increase LUE AROM to [facility] to allow her to perform functional activities around home.    Status On-going      PT LONG TERM GOAL #3   Title Patient will increase LUE strength to at least 4+/5 to allow her to perform tasks in her home.    Status On-going      PT LONG TERM GOAL #4   Title Patient will increase L grip strength to within 10 pounds ot R grip strength to allow her to open containers.    Status On-going                   Plan - 06/22/21 1025     Clinical Impression Statement [name] is progressing towards goal related activites.  She has increased her L grip strength since initial evaluation and continues to progress with ROM.  She is preparing for surgery next week for an anticipated L distal clavicle resection.  She will be reassessed following that  surgical procedure with new orders from MD already received.    PT Treatment/Interventions ADLs/Self Care Home Management;Cryotherapy;Electrical Stimulation;Iontophoresis '4mg'$ /ml Dexamethasone;Moist Heat;Ultrasound;Functional mobility training;Therapeutic activities;Therapeutic exercise;Neuromuscular re-education;Patient/family education;Manual techniques;Scar mobilization;Passive range of motion;Dry needling;Taping;Joint Manipulations    PT Next Visit Plan reassessment following surgery    Consulted and Agree with Plan of Care Patient             Patient will benefit from skilled therapeutic intervention in order to improve the following deficits and impairments:  Decreased range of motion, Increased muscle spasms, Impaired UE functional use, Decreased activity tolerance, Pain, Decreased strength, Postural dysfunction  Visit Diagnosis: Stiffness of left shoulder, not elsewhere classified  Stiffness of left elbow, not elsewhere classified  Acute pain of left shoulder  Muscle weakness (generalized)  Abnormal posture  Cramp and spasm     Problem List Patient Active Problem List   Diagnosis Date Noted   History of miscarriage 08/19/2015   Elevated WBC count 05/18/2015   Pneumococcal vaccination declined 03/15/2015   Diabetes mellitus, type 2 ([place]) 12/23/2014   IMPAIRED GLUCOSE TOLERANCE TEST 10/13/2010    Juel Burrow, PT, DPT 06/22/2021, 10:48 AM  [place]. [place], [place], [address] Phone: [phone]   Fax:  [phone]  Name: Lynn [place] MRN: [id number] Date of Birth: 11/01/1971

## 2021-06-27 ENCOUNTER — Ambulatory Visit: Payer: Medicaid Other | Admitting: Physical Therapy

## 2021-06-27 NOTE — H&P (Signed)
PREOPERATIVE H&P  Chief Complaint: LEFT SHOULDER PRIMARY OSTEOARTHRITIS; IMPINGEMENT SYNDROME; BICIPITAL TENDINITIS  HPI: Lynn Moody is a 50 y.o. female who is scheduled for, Procedure(s): SHOULDER ARTHROSCOPY WITH DISTAL CLAVICLE RESECTION SHOULDER ARTHROSCOPY WITH SUBACROMIAL DECOMPRESSION; PARTIAL ACROMIOPLASTY WITH CORACOACROMIAL RELEASE AND POSSIBLE BICEP TENDODESIS.   Patient has a past medical history significant for HTN and DM.   Lynn Moody is a 50 year old who Dr. Griffin Basil did a left distal humerus open reduction and internal fixation on 05-11-21. 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 She had a history before of chronic shoulder pain on the  left side. She tried and failed injections for an extended period of time. She was planning on having surgery prior to this injury, unfortunately now she has continued  to have worsening pain in her shoulder, separate from her humerus.   Her symptoms are rated as moderate to severe, and have been worsening.  This is significantly impairing activities of daily living.    Please see clinic note for further details on this patient's care.    She has elected for surgical management.   Past Medical History:  Diagnosis Date   Anemia    Diabetes mellitus without complication ([place])    Hypertension    Past Surgical History:  Procedure Laterality Date   BREAST BIOPSY     left breast with maker placed   CESAREAN SECTION     DENTAL SURGERY     ORIF ELBOW FRACTURE Left 05/11/2021   Procedure: OPEN REDUCTION INTERNAL FIXATION DISTAL HUMERUS FRACTURE;  Surgeon: Hiram Gash, MD;  Location: [place];  Service: Orthopedics;  Laterality: Left;   ULNAR NERVE TRANSPOSITION Left 05/11/2021   Procedure: RADIAL  NERVE DECOMPRESSION;  Surgeon: Hiram Gash, MD;  Location: [place];  Service: Orthopedics;  Laterality: Left;   Social History   Socioeconomic History   Marital status: Married    Spouse name:  Not on file   Number of children: Not on file   Years of education: Not on file   Highest education level: Not on file  Occupational History   Not on file  Tobacco Use   Smoking status: Never   Smokeless tobacco: Never  Substance and Sexual Activity   Alcohol use: No   Drug use: No   Sexual activity: Yes    Birth control/protection: Condom  Other Topics Concern   Not on file  Social History Narrative   Not on file   Social Determinants of Health   Financial Resource Strain: Not on file  Food Insecurity: Not on file  Transportation Needs: Not on file  Physical Activity: Not on file  Stress: Not on file  Social Connections: Not on file   History reviewed. No pertinent family history. No Known Allergies Prior to Admission medications   Medication Sig Start Date End Date Taking? Authorizing Provider  acetaminophen (TYLENOL) 500 MG tablet Take 1,000 mg by mouth every 6 (six) hours as needed.   Yes [provider]  amLODipine-benazepril (LOTREL) 5-20 MG capsule Take 1 capsule by mouth daily.   Yes [provider]  celecoxib (CELEBREX) 100 MG capsule Take 100 mg by mouth 2 (two) times daily.   Yes [provider]  Dulaglutide (TRULICITY) 1.5 0000000 SOPN Inject into the skin.   Yes [provider]  ferrous sulfate 324 MG TBEC Take 324 mg by mouth.   Yes [provider]  gabapentin (NEURONTIN) 100 MG capsule Take  1 capsule (100 mg total) by mouth 3 (three) times daily. For pain 05/11/21  Yes Alta Shober, Maylene Roes, PA-C  glimepiride (AMARYL) 4 MG tablet Take 4 mg by mouth daily with breakfast.   Yes [provider]  methocarbamol (ROBAXIN) 500 MG tablet Take 500 mg by mouth every 8 (eight) hours as needed for muscle spasms.   Yes [provider]  pioglitazone (ACTOS) 45 MG tablet Take 45 mg by mouth daily.   Yes [provider]    ROS: All other systems have been reviewed and were otherwise negative with the  exception of those mentioned in the HPI and as above.  Physical Exam: General: Alert, no acute distress Cardiovascular: No pedal edema Respiratory: No cyanosis, no use of accessory musculature GI: No organomegaly, abdomen is soft and non-tender Skin: No lesions in the area of chief complaint Neurologic: Sensation intact distally Psychiatric: Patient is competent for consent with normal mood and affect Lymphatic: No axillary or cervical lymphadenopathy  MUSCULOSKELETAL:  Left shoulder: Cuff strength testing is difficult to assess based one her fracture but she has positive AC tenderness and impingement. she has an equivocal O'Brien's.   Imaging: MRI report is reviewed and demonstrates a partial thickness cuff tear. There is some AC arthrosis, Type II acromion. Cuff tendinosis.  Assessment: LEFT SHOULDER PRIMARY OSTEOARTHRITIS; IMPINGEMENT SYNDROME; BICIPITAL TENDINITIS  Plan: Plan for Procedure(s): SHOULDER ARTHROSCOPY WITH DISTAL CLAVICLE RESECTION SHOULDER ARTHROSCOPY WITH SUBACROMIAL DECOMPRESSION; PARTIAL ACROMIOPLASTY WITH CORACOACROMIAL RELEASE AND POSSIBLE BICEP TENDODESIS  She has failed non-operative measures for her shoulder. Dr. Griffin Basil and the patient talked about the risk, benefits and concerns of a subacromial decompression, distal clavicle excision and we  will attempt to stay away from doing a biceps tenodesis if at all possible so that her immobilization is minimal. Even if we did a biceps tenodesis we would likely do a penetrating technique so we can get her out of the sling relativelyquickly so she can continue with therapy  The risks benefits and alternatives were discussed with the patient including but not limited to the risks of nonoperative treatment, versus surgical intervention including infection, bleeding, nerve injury,  blood clots, cardiopulmonary complications, morbidity, mortality, among others, and they were willing to proceed.   The patient acknowledged  the explanation, agreed to proceed with the plan and consent was signed.   Operative Plan: Left shoulder scope with SAD, DCE, possible BT Discharge Medications: Standard DVT Prophylaxis: None Physical Therapy: Outpatient PT Special Discharge needs: S[place]. [place], PA-C  06/27/2021 8:45 PM

## 2021-06-28 ENCOUNTER — Encounter (HOSPITAL_BASED_OUTPATIENT_CLINIC_OR_DEPARTMENT_OTHER)
Admission: RE | Disposition: A | Discharge status: Home / Self Care | Payer: Self-pay | Source: Home / Self Care | Attending: Orthopaedic Surgery

## 2021-06-28 ENCOUNTER — Ambulatory Visit (HOSPITAL_BASED_OUTPATIENT_CLINIC_OR_DEPARTMENT_OTHER)
Admission: RE | Admit: 2021-06-28 | Discharge: 2021-06-28 | Disposition: A | Discharge status: Home / Self Care | Payer: Medicaid Other | Attending: Orthopaedic Surgery | Admitting: Orthopaedic Surgery

## 2021-06-28 ENCOUNTER — Ambulatory Visit (HOSPITAL_BASED_OUTPATIENT_CLINIC_OR_DEPARTMENT_OTHER): Payer: Medicaid Other | Admitting: Anesthesiology

## 2021-06-28 ENCOUNTER — Encounter (HOSPITAL_BASED_OUTPATIENT_CLINIC_OR_DEPARTMENT_OTHER): Payer: Self-pay | Admitting: Orthopaedic Surgery

## 2021-06-28 DIAGNOSIS — Z79899 Other long term (current) drug therapy: Secondary | ICD-10-CM | POA: Insufficient documentation

## 2021-06-28 DIAGNOSIS — Z7984 Long term (current) use of oral hypoglycemic drugs: Secondary | ICD-10-CM | POA: Insufficient documentation

## 2021-06-28 DIAGNOSIS — I10 Essential (primary) hypertension: Secondary | ICD-10-CM | POA: Diagnosis not present

## 2021-06-28 DIAGNOSIS — M7522 Bicipital tendinitis, left shoulder: Secondary | ICD-10-CM | POA: Insufficient documentation

## 2021-06-28 DIAGNOSIS — M19012 Primary osteoarthritis, left shoulder: Secondary | ICD-10-CM | POA: Diagnosis not present

## 2021-06-28 DIAGNOSIS — Z791 Long term (current) use of non-steroidal anti-inflammatories (NSAID): Secondary | ICD-10-CM | POA: Insufficient documentation

## 2021-06-28 DIAGNOSIS — E119 Type 2 diabetes mellitus without complications: Secondary | ICD-10-CM | POA: Insufficient documentation

## 2021-06-28 DIAGNOSIS — M7542 Impingement syndrome of left shoulder: Secondary | ICD-10-CM | POA: Diagnosis not present

## 2021-06-28 HISTORY — PX: SHOULDER ARTHROSCOPY WITH SUBACROMIAL DECOMPRESSION AND BICEP TENDON REPAIR: SHX5689

## 2021-06-28 HISTORY — PX: SHOULDER ARTHROSCOPY WITH DISTAL CLAVICLE RESECTION: SHX5675

## 2021-06-28 LAB — BASIC METABOLIC PANEL
Anion gap: 11 (ref 5–15)
BUN: 7 mg/dL (ref 6–20)
CO2: 24 mmol/L (ref 22–32)
Calcium: 8.7 mg/dL — ABNORMAL LOW (ref 8.9–10.3)
Chloride: 104 mmol/L (ref 98–111)
Creatinine, Ser: 0.62 mg/dL (ref 0.44–1.00)
GFR, Estimated: >60 mL/min (ref >60)
Glucose, Bld: 117 mg/dL — ABNORMAL HIGH (ref 70–99)
Potassium: 3.6 mmol/L (ref 3.5–5.1)
Sodium: 139 mmol/L (ref 135–145)

## 2021-06-28 LAB — GLUCOSE, CAPILLARY
Glucose-Capillary: 112 mg/dL — ABNORMAL HIGH (ref 70–99)
Glucose-Capillary: 88 mg/dL (ref 70–99)

## 2021-06-28 LAB — POCT PREGNANCY, URINE: Preg Test, Ur: NEGATIVE

## 2021-06-28 SURGERY — SHOULDER ARTHROSCOPY WITH DISTAL CLAVICLE RESECTION
Anesthesia: Regional | Site: Shoulder | Laterality: Left

## 2021-06-28 MED ORDER — PHENYLEPHRINE HCL (PRESSORS) 10 MG/ML IV SOLN
INTRAVENOUS | Status: AC
  Filled 2021-06-28: qty 1

## 2021-06-28 MED ORDER — VANCOMYCIN HCL 1000 MG IV SOLR
INTRAVENOUS | Status: AC
  Filled 2021-06-28: qty 1000

## 2021-06-28 MED ORDER — MIDAZOLAM HCL 2 MG/2ML IJ SOLN
INTRAMUSCULAR | Status: AC
  Filled 2021-06-28: qty 2

## 2021-06-28 MED ORDER — OXYCODONE HCL 5 MG PO TABS: ORAL_TABLET | ORAL | 0 refills | Status: AC

## 2021-06-28 MED ORDER — LIDOCAINE HCL (CARDIAC) PF 100 MG/5ML IV SOSY
PREFILLED_SYRINGE | INTRAVENOUS | Status: DC | PRN
  Administered 2021-06-28: 20 mg via INTRAVENOUS

## 2021-06-28 MED ORDER — DEXAMETHASONE SODIUM PHOSPHATE 4 MG/ML IJ SOLN
INTRAMUSCULAR | Status: DC | PRN
  Administered 2021-06-28: 4 mg via INTRAVENOUS

## 2021-06-28 MED ORDER — PROPOFOL 10 MG/ML IV BOLUS
INTRAVENOUS | Status: DC | PRN
  Administered 2021-06-28: 100 mg via INTRAVENOUS
  Administered 2021-06-28: 30 mg via INTRAVENOUS

## 2021-06-28 MED ORDER — ACETAMINOPHEN 500 MG PO TABS: 1000.0000 mg | ORAL_TABLET | Freq: Three times a day (TID) | ORAL | 0 refills | Status: AC

## 2021-06-28 MED ORDER — FENTANYL CITRATE (PF) 100 MCG/2ML IJ SOLN
50.0000 ug | Freq: Once | INTRAMUSCULAR | Status: AC
  Administered 2021-06-28: 50 ug via INTRAVENOUS

## 2021-06-28 MED ORDER — BUPIVACAINE HCL (PF) 0.5 % IJ SOLN
INTRAMUSCULAR | Status: DC | PRN
  Administered 2021-06-28: 15 mL via PERINEURAL

## 2021-06-28 MED ORDER — CELECOXIB 100 MG PO CAPS: 100.0000 mg | ORAL_CAPSULE | Freq: Two times a day (BID) | ORAL | 0 refills | Status: AC

## 2021-06-28 MED ORDER — EPINEPHRINE PF 1 MG/ML IJ SOLN
INTRAMUSCULAR | Status: AC
  Filled 2021-06-28: qty 4

## 2021-06-28 MED ORDER — ACETAMINOPHEN 500 MG PO TABS
ORAL_TABLET | ORAL | Status: AC
  Filled 2021-06-28: qty 2

## 2021-06-28 MED ORDER — PHENYLEPHRINE HCL (PRESSORS) 10 MG/ML IV SOLN
INTRAVENOUS | Status: DC | PRN
  Administered 2021-06-28 (×3): 80 ug via INTRAVENOUS

## 2021-06-28 MED ORDER — MIDAZOLAM HCL 2 MG/2ML IJ SOLN
2.0000 mg | Freq: Once | INTRAMUSCULAR | Status: AC
  Administered 2021-06-28: 2 mg via INTRAVENOUS

## 2021-06-28 MED ORDER — PROPOFOL 10 MG/ML IV BOLUS
INTRAVENOUS | Status: AC
  Filled 2021-06-28: qty 20

## 2021-06-28 MED ORDER — LIDOCAINE HCL (PF) 2 % IJ SOLN
INTRAMUSCULAR | Status: AC
  Filled 2021-06-28: qty 5

## 2021-06-28 MED ORDER — SUCCINYLCHOLINE CHLORIDE 200 MG/10ML IV SOSY
PREFILLED_SYRINGE | INTRAVENOUS | Status: DC | PRN
  Administered 2021-06-28: 200 mg via INTRAVENOUS

## 2021-06-28 MED ORDER — ONDANSETRON HCL 4 MG/2ML IJ SOLN
INTRAMUSCULAR | Status: DC | PRN
Start: 2021-06-28 — End: 2021-06-28
  Administered 2021-06-28: 4 mg via INTRAVENOUS

## 2021-06-28 MED ORDER — ONDANSETRON HCL 4 MG PO TABS: 4.0000 mg | ORAL_TABLET | Freq: Three times a day (TID) | ORAL | 0 refills | Status: AC | PRN

## 2021-06-28 MED ORDER — ACETAMINOPHEN 500 MG PO TABS
1000.0000 mg | ORAL_TABLET | Freq: Once | ORAL | Status: AC
  Administered 2021-06-28: 1000 mg via ORAL

## 2021-06-28 MED ORDER — ROCURONIUM BROMIDE 10 MG/ML (PF) SYRINGE
PREFILLED_SYRINGE | INTRAVENOUS | Status: AC
  Filled 2021-06-28: qty 10

## 2021-06-28 MED ORDER — SUGAMMADEX SODIUM 200 MG/2ML IV SOLN
INTRAVENOUS | Status: DC | PRN
  Administered 2021-06-28: 200 mg via INTRAVENOUS

## 2021-06-28 MED ORDER — CEFAZOLIN SODIUM-DEXTROSE 2-4 GM/100ML-% IV SOLN
2.0000 g | INTRAVENOUS | Status: AC
  Administered 2021-06-28: 2 g via INTRAVENOUS

## 2021-06-28 MED ORDER — BUPIVACAINE LIPOSOME 1.3 % IJ SUSP
INTRAMUSCULAR | Status: DC | PRN
  Administered 2021-06-28: 10 mL via PERINEURAL

## 2021-06-28 MED ORDER — FENTANYL CITRATE (PF) 100 MCG/2ML IJ SOLN: 25.0000 ug | INTRAMUSCULAR | Status: DC | PRN

## 2021-06-28 MED ORDER — ROCURONIUM BROMIDE 100 MG/10ML IV SOLN
INTRAVENOUS | Status: DC | PRN
  Administered 2021-06-28: 30 mg via INTRAVENOUS

## 2021-06-28 MED ORDER — CEFAZOLIN SODIUM-DEXTROSE 2-4 GM/100ML-% IV SOLN
INTRAVENOUS | Status: AC
  Filled 2021-06-28: qty 100

## 2021-06-28 MED ORDER — LACTATED RINGERS IV SOLN: INTRAVENOUS | Status: DC

## 2021-06-28 MED ORDER — SODIUM CHLORIDE 0.9 % IR SOLN
Status: DC | PRN
  Administered 2021-06-28: 6000 mL

## 2021-06-28 MED ORDER — FENTANYL CITRATE (PF) 100 MCG/2ML IJ SOLN
INTRAMUSCULAR | Status: AC
  Filled 2021-06-28: qty 2

## 2021-06-28 SURGICAL SUPPLY — 55 items
AID PSTN UNV HD RSTRNT DISP (MISCELLANEOUS) ×1
ANCHOR SUT 1.8 FBRTK KNTLS 2SU (Anchor) ×4 IMPLANT
APL PRP STRL LF DISP 70% ISPRP (MISCELLANEOUS) ×1
BLADE EXCALIBUR 4.0X13 (MISCELLANEOUS) ×2 IMPLANT
BLADE SURG 10 STRL SS (BLADE) IMPLANT
BURR OVAL 8 FLU 4.0X13 (MISCELLANEOUS) IMPLANT
CANNULA 5.75X71 LONG (CANNULA) IMPLANT
CANNULA PASSPORT 5 (CANNULA) IMPLANT
CANNULA PASSPORT BUTTON 10-40 (CANNULA) ×2 IMPLANT
CANNULA TWIST IN 8.25X7CM (CANNULA) IMPLANT
CHLORAPREP W/TINT 26 (MISCELLANEOUS) ×2 IMPLANT
CLSR STERI-STRIP ANTIMIC 1/2X4 (GAUZE/BANDAGES/DRESSINGS) ×2 IMPLANT
COOLER ICEMAN CLASSIC (MISCELLANEOUS) ×2 IMPLANT
DRAPE IMP U-DRAPE 54X76 (DRAPES) ×2 IMPLANT
DRAPE INCISE IOBAN 66X45 STRL (DRAPES) IMPLANT
DRAPE SHOULDER BEACH CHAIR (DRAPES) ×2 IMPLANT
DRSG PAD ABDOMINAL 8X10 ST (GAUZE/BANDAGES/DRESSINGS) ×2 IMPLANT
DW OUTFLOW CASSETTE/TUBE SET (MISCELLANEOUS) ×2 IMPLANT
GAUZE SPONGE 4X4 12PLY STRL (GAUZE/BANDAGES/DRESSINGS) ×2 IMPLANT
GLOVE SRG 8 PF TXTR STRL LF DI (GLOVE) ×1 IMPLANT
GLOVE SURG ENC MOIS LTX SZ6.5 (GLOVE) ×4 IMPLANT
GLOVE SURG LTX SZ8 (GLOVE) ×2 IMPLANT
GLOVE SURG UNDER POLY LF SZ6.5 (GLOVE) ×6 IMPLANT
GLOVE SURG UNDER POLY LF SZ8 (GLOVE) ×2
GOWN STRL REUS W/ TWL LRG LVL3 (GOWN DISPOSABLE) ×3 IMPLANT
GOWN STRL REUS W/TWL LRG LVL3 (GOWN DISPOSABLE) ×6
GOWN STRL REUS W/TWL XL LVL3 (GOWN DISPOSABLE) ×2 IMPLANT
KIT STABILIZATION SHOULDER (MISCELLANEOUS) ×2 IMPLANT
KIT STR SPEAR 1.8 FBRTK DISP (KITS) ×2 IMPLANT
LASSO CRESCENT QUICKPASS (SUTURE) IMPLANT
MANIFOLD NEPTUNE II (INSTRUMENTS) ×2 IMPLANT
NDL SAFETY ECLIPSE 18X1.5 (NEEDLE) ×1 IMPLANT
NEEDLE HYPO 18GX1.5 SHARP (NEEDLE) ×2
NEEDLE SCORPION MULTI FIRE (NEEDLE) IMPLANT
PACK ARTHROSCOPY DSU (CUSTOM PROCEDURE TRAY) ×2 IMPLANT
PACK BASIN DAY SURGERY FS (CUSTOM PROCEDURE TRAY) ×2 IMPLANT
PAD COLD SHLDR WRAP-ON (PAD) ×2 IMPLANT
PORT APPOLLO RF 90DEGREE MULTI (SURGICAL WAND) ×2 IMPLANT
RESTRAINT HEAD UNIVERSAL NS (MISCELLANEOUS) ×2 IMPLANT
SHEET MEDIUM DRAPE 40X70 STRL (DRAPES) ×2 IMPLANT
SLEEVE SCD COMPRESS KNEE MED (STOCKING) ×2 IMPLANT
SLING ARM FOAM STRAP LRG (SOFTGOODS) IMPLANT
SUT FIBERWIRE #2 38 T-5 BLUE (SUTURE)
SUT MNCRL AB 4-0 PS2 18 (SUTURE) ×2 IMPLANT
SUT PDS AB 1 CT  36 (SUTURE)
SUT PDS AB 1 CT 36 (SUTURE) IMPLANT
SUT TIGER TAPE 7 IN WHITE (SUTURE) IMPLANT
SUTURE FIBERWR #2 38 T-5 BLUE (SUTURE) IMPLANT
SUTURE TAPE TIGERLINK 1.3MM BL (SUTURE) IMPLANT
SUTURETAPE TIGERLINK 1.3MM BL (SUTURE)
SYR 5ML LL (SYRINGE) ×2 IMPLANT
TAPE FIBER 2MM 7IN #2 BLUE (SUTURE) IMPLANT
TOWEL GREEN STERILE FF (TOWEL DISPOSABLE) ×4 IMPLANT
TUBE CONNECTING 20X1/4 (TUBING) ×2 IMPLANT
TUBING ARTHROSCOPY IRRIG 16FT (MISCELLANEOUS) ×2 IMPLANT

## 2021-06-28 NOTE — Anesthesia Procedure Notes (Signed)
Anesthesia Regional Block: Interscalene brachial plexus block   Pre-Anesthetic Checklist: , timeout performed,  Correct Patient, Correct Site, Correct Laterality,  Correct Procedure, Correct Position, site marked,  Risks and benefits discussed,  Surgical consent,  Pre-op evaluation,  At surgeon's request and post-op pain management  Laterality: Left  Prep: Maximum Sterile Barrier Precautions used, chloraprep       Needles:  Injection technique: Single-shot  Needle Type: Echogenic Stimulator Needle     Needle Length: 4cm  Needle Gauge: 22     Additional Needles:   Procedures:,,,, ultrasound used (permanent image in chart),,    Narrative:  Start time: 06/28/2021 11:12 AM End time: 06/28/2021 11:17 AM Injection made incrementally with aspirations every 5 mL.  Performed by: Personally  Anesthesiologist: Freddrick March, MD  Additional Notes: Monitors applied. No increased pain on injection. No increased resistance to injection. Injection made in 5cc increments. Good needle visualization. Patient tolerated procedure well.

## 2021-06-28 NOTE — Discharge Instructions (Addendum)
Ophelia Charter MD, MPH Noemi Chapel, PA-C [facility] [address], [address] [phone] (tel)   [phone] (fax)   POST-OPERATIVE INSTRUCTIONS - SHOULDER ARTHROSCOPY  WOUND CARE You may remove the Operative Dressing on Post-Op Day #3 (72hrs after surgery).   Alternatively if you would like you can leave dressing on until follow-up if within 7-8 days but keep it dry. Leave steri-strips in place until they fall off on their own, usually 2 weeks postop. There may be a small amount of fluid/bleeding leaking at the surgical site.  This is normal; the shoulder is filled with fluid during the procedure and can leak for 24-48hrs after surgery.  You may change/reinforce the bandage as needed.  Use the Cryocuff or Ice as often as possible for the first 7 days, then as needed for pain relief. Always keep a towel, ACE wrap or other barrier between the cooling unit and your skin.  You may shower on Post-Op Day #3. Gently pat the area dry. Do not soak the shoulder in water or submerge it. Keep dry incisions as dry as possible. Do not go swimming in the pool or ocean until 4 weeks after surgery or when otherwise instructed.    EXERCISES/BRACING Sling should be used at all times until follow-up.  You can remove sling for hygiene.    Please continue to ambulate and do not stay sitting or lying for too long. Perform foot and wrist pumps to assist in circulation.  POST-OP MEDICATIONS- Multimodal approach to pain control In general your pain will be controlled with a combination of substances.  Prescriptions unless otherwise discussed are electronically sent to your pharmacy.  This is a carefully made plan we use to minimize narcotic use.     Celebrex - Anti-inflammatory medication taken on a scheduled basis Acetaminophen - Non-narcotic pain medicine taken on a scheduled basis  Oxycodone - This is a strong narcotic, to be used only on an "as needed" basis for SEVERE  pain. Zofran - take as needed for nausea   FOLLOW-UP If you develop a Fever (?101.5), Redness or Drainage from the surgical incision site, please call our office to arrange for an evaluation. Please call the office to schedule a follow-up appointment for your suture removal, 10-14 days post-operatively.    HELPFUL INFORMATION  If you had a block, it will wear off between 8-24 hrs postop typically.  This is period when your pain may go from nearly zero to the pain you would have had postop without the block.  This is an abrupt transition but nothing dangerous is happening.  You may take an extra dose of narcotic when this happens.  You may be more comfortable sleeping in a semi-seated position the first few nights following surgery.  Keep a pillow propped under the elbow and forearm for comfort.  If you have a recliner type of chair it might be beneficial.  If not that is fine too, but it would be helpful to sleep propped up with pillows behind your operated shoulder as well under your elbow and forearm.  This will reduce pulling on the suture lines.  When dressing, put your operative arm in the sleeve first.  When getting undressed, take your operative arm out last.  Loose fitting, button-down shirts are recommended.  Often in the first days after surgery you may be more comfortable keeping your operative arm under your shirt and not through the sleeve.  You may return to work/school in the next couple  of days when you feel up to it.  Desk work and typing in the sling is fine.  We suggest you use the pain medication the first night prior to going to bed, in order to ease any pain when the anesthesia wears off. You should avoid taking pain medications on an empty stomach as it will make you nauseous.  You should wean off your narcotic medicines as soon as you are able.  Most patients will be off or using minimal narcotics before their first postop appointment.   Do not drink alcoholic  beverages or take illicit drugs when taking pain medications.  It is against the law to drive while taking narcotics.  In some states it is against the law to drive while your arm is in a sling.   Pain medication may make you constipated.  Below are a few solutions to try in this order: Decrease the amount of pain medication if you aren't having pain. Drink lots of decaffeinated fluids. Drink prune juice and/or eat dried prunes  If the first 3 don't work start with additional solutions Take Colace - an over-the-counter stool softener Take Senokot - an over-the-counter laxative Take Miralax - a stronger over-the-counter laxative  For more information including helpful videos and documents visit our website:   [email or web address]    May take Tylenol after 4 pm, if needed.   Post Anesthesia Home Care Instructions  Activity: Get plenty of rest for the remainder of the day. A responsible individual must stay with you for 24 hours following the procedure.  For the next 24 hours, DO NOT: -Drive a car -Paediatric nurse -Drink alcoholic beverages -Take any medication unless instructed by your physician -Make any legal decisions or sign important papers.  Meals: Start with liquid foods such as gelatin or soup. Progress to regular foods as tolerated. Avoid greasy, spicy, heavy foods. If nausea and/or vomiting occur, drink only clear liquids until the nausea and/or vomiting subsides. Call your physician if vomiting continues.  Special Instructions/Symptoms: Your throat may feel dry or sore from the anesthesia or the breathing tube placed in your throat during surgery. If this causes discomfort, gargle with warm salt water. The discomfort should disappear within 24 hours.  If you had a scopolamine patch placed behind your ear for the management of post- operative nausea and/or vomiting:  1. The medication in the patch is effective for 72 hours, after  which it should be removed.  Wrap patch in a tissue and discard in the trash. Wash hands thoroughly with soap and water. 2. You may remove the patch earlier than 72 hours if you experience unpleasant side effects which may include dry mouth, dizziness or visual disturbances. 3. Avoid touching the patch. Wash your hands with soap and water after contact with the patch.    Regional Anesthesia Blocks  1. Numbness or the inability to move the "blocked" extremity may last from 3-48 hours after placement. The length of time depends on the medication injected and your individual response to the medication. If the numbness is not going away after 48 hours, call your surgeon.  2. The extremity that is blocked will need to be protected until the numbness is gone and the  Strength has returned. Because you cannot feel it, you will need to take extra care to avoid injury. Because it may be weak, you may have difficulty moving it or using it. You may not know what position it is in without looking at it while the block  is in effect.  3. For blocks in the legs and feet, returning to weight bearing and walking needs to be done carefully. You will need to wait until the numbness is entirely gone and the strength has returned. You should be able to move your leg and foot normally before you try and bear weight or walk. You will need someone to be with you when you first try to ensure you do not fall and possibly risk injury.  4. Bruising and tenderness at the needle site are common side effects and will resolve in a few days.  5. Persistent numbness or new problems with movement should be communicated to the surgeon or the [facility] [phone] [place] [phone]). Information for Discharge Teaching: EXPAREL (bupivacaine liposome injectable suspension)   Your surgeon or anesthesiologist gave you EXPAREL(bupivacaine) to help control your pain after surgery.  EXPAREL is a local  anesthetic that provides pain relief by numbing the tissue around the surgical site. EXPAREL is designed to release pain medication over time and can control pain for up to 72 hours. Depending on how you respond to EXPAREL, you may require less pain medication during your recovery.  Possible side effects: Temporary loss of sensation or ability to move in the area where bupivacaine was injected. Nausea, vomiting, constipation Rarely, numbness and tingling in your mouth or lips, lightheadedness, or anxiety may occur. Call your doctor right away if you think you may be experiencing any of these sensations, or if you have other questions regarding possible side effects.  Follow all other discharge instructions given to you by your surgeon or nurse. Eat a healthy diet and drink plenty of water or other fluids.  If you return to the hospital for any reason within 96 hours following the administration of EXPAREL, it is important for health care providers to know that you have received this anesthetic. A teal colored band has been placed on your arm with the date, time and amount of EXPAREL you have received in order to alert and inform your health care providers. Please leave this armband in place for the full 96 hours following administration, and then you may remove the band.

## 2021-06-28 NOTE — Anesthesia Postprocedure Evaluation (Signed)
Anesthesia Post Note  Patient: Lynn Moody  Procedure(s) Performed: SHOULDER ARTHROSCOPY WITH DISTAL CLAVICLE RESECTION (Left: Shoulder) SHOULDER ARTHROSCOPY WITH SUBACROMIAL DECOMPRESSION; PARTIAL ACROMIOPLASTY WITH CORACOACROMIAL RELEASE AND  BICEP TENDODESIS (Left: Shoulder)     Patient location during evaluation: PACU Anesthesia Type: Regional and General Level of consciousness: awake and alert Pain management: pain level controlled Vital Signs Assessment: post-procedure vital signs reviewed and stable Respiratory status: spontaneous breathing, nonlabored ventilation, respiratory function stable and patient connected to nasal cannula oxygen Cardiovascular status: blood pressure returned to baseline and stable Postop Assessment: no apparent nausea or vomiting Anesthetic complications: no   No notable events documented.  Last Vitals:  Vitals:   06/28/21 1315 06/28/21 1330  BP: 136/81 120/75  Pulse: 93 100  Resp: 13 20  Temp:    SpO2: 95% 96%    Last Pain:  Vitals:   06/28/21 1315  TempSrc:   PainSc: 0-No pain                 Daymeon Fischman L Rumaldo Difatta

## 2021-06-28 NOTE — Anesthesia Preprocedure Evaluation (Addendum)
Anesthesia Evaluation  Patient identified by MRN, date of birth, ID band Patient awake    Reviewed: Allergy & Precautions, NPO status , Patient's Chart, lab work & pertinent test results  Airway Mallampati: III  TM Distance: >3 FB Neck ROM: Full  Mouth opening: Limited Mouth Opening  Dental no notable dental hx. (+) Teeth Intact, Dental Advisory Given   Pulmonary neg pulmonary ROS,    Pulmonary exam normal breath sounds clear to auscultation       Cardiovascular hypertension, Pt. on medications Normal cardiovascular exam Rhythm:Regular Rate:Normal     Neuro/Psych negative neurological ROS  negative psych ROS   GI/Hepatic negative GI ROS, Neg liver ROS,   Endo/Other  diabetes, Type 2, Oral Hypoglycemic Agents  Renal/GU negative Renal ROS  negative genitourinary   Musculoskeletal negative musculoskeletal ROS (+)   Abdominal   Peds  Hematology negative hematology ROS (+)   Anesthesia Other Findings   Reproductive/Obstetrics                            Anesthesia Physical Anesthesia Plan  ASA: 2  Anesthesia Plan: General and Regional   Post-op Pain Management:  Regional for Post-op pain   Induction: Intravenous  PONV Risk Score and Plan: 3 and Midazolam, Dexamethasone and Ondansetron  Airway Management Planned: Oral ETT  Additional Equipment:   Intra-op Plan:   Post-operative Plan: Extubation in OR  Informed Consent: I have reviewed the patients History and Physical, chart, labs and discussed the procedure including the risks, benefits and alternatives for the proposed anesthesia with the patient or authorized representative who has indicated his/her understanding and acceptance.     Dental advisory given  Plan Discussed with: CRNA  Anesthesia Plan Comments:         Anesthesia Quick Evaluation

## 2021-06-28 NOTE — Anesthesia Procedure Notes (Signed)
Procedure Name: Intubation Date/Time: 06/28/2021 11:38 AM Performed by: Maryella Shivers, CRNA Pre-anesthesia Checklist: Patient identified, Emergency Drugs available, Suction available and Patient being monitored Patient Re-evaluated:Patient Re-evaluated prior to induction Oxygen Delivery Method: Circle system utilized Preoxygenation: Pre-oxygenation with 100% oxygen Induction Type: IV induction Ventilation: Mask ventilation without difficulty Laryngoscope Size: Mac and 3 Grade View: Grade II Tube type: Oral Tube size: 7.0 mm Number of attempts: 1 Airway Equipment and Method: Stylet and Oral airway Placement Confirmation: ETT inserted through vocal cords under direct vision, positive ETCO2 and breath sounds checked- equal and bilateral Secured at: 21 cm Tube secured with: Tape Dental Injury: Teeth and Oropharynx as per pre-operative assessment

## 2021-06-28 NOTE — Interval H&P Note (Signed)
All questions answered, patient wants to proceed with procedure. ? ?

## 2021-06-28 NOTE — Progress Notes (Signed)
Assisted Dr. Lanetta Inch with left, ultrasound guided, interscalene  block. Side rails up, monitors on throughout procedure. See vital signs in flow sheet. Tolerated Procedure well.

## 2021-06-28 NOTE — Transfer of Care (Signed)
Immediate Anesthesia Transfer of Care Note  Patient: Lynn Moody  Procedure(s) Performed: SHOULDER ARTHROSCOPY WITH DISTAL CLAVICLE RESECTION (Left: Shoulder) SHOULDER ARTHROSCOPY WITH SUBACROMIAL DECOMPRESSION; PARTIAL ACROMIOPLASTY WITH CORACOACROMIAL RELEASE AND  BICEP TENDODESIS (Left: Shoulder)  Patient Location: PACU  Anesthesia Type:General  Level of Consciousness: sedated  Airway & Oxygen Therapy: Patient Spontanous Breathing and Patient connected to nasal cannula oxygen  Post-op Assessment: Report given to RN and Post -op Vital signs reviewed and stable  Post vital signs: Reviewed and stable  Last Vitals:  Vitals Value Taken Time  BP 145/91 06/28/21 1245  Temp    Pulse 97 06/28/21 1249  Resp 18 06/28/21 1249  SpO2 98 % 06/28/21 1249  Vitals shown include unvalidated device data.  Last Pain:  Vitals:   06/28/21 1004  TempSrc: Oral  PainSc: 3       Patients Stated Pain Goal: 3 ([id number])  Complications: No notable events documented.

## 2021-06-29 ENCOUNTER — Ambulatory Visit: Payer: Medicaid Other | Attending: Orthopaedic Surgery

## 2021-06-29 ENCOUNTER — Other Ambulatory Visit: Payer: Self-pay

## 2021-06-29 ENCOUNTER — Ambulatory Visit: Payer: Medicaid Other | Admitting: Physical Therapy

## 2021-06-29 ENCOUNTER — Ambulatory Visit: Payer: Medicaid Other

## 2021-06-29 ENCOUNTER — Encounter (HOSPITAL_BASED_OUTPATIENT_CLINIC_OR_DEPARTMENT_OTHER): Payer: Self-pay | Admitting: Orthopaedic Surgery

## 2021-06-29 DIAGNOSIS — M25622 Stiffness of left elbow, not elsewhere classified: Secondary | ICD-10-CM | POA: Diagnosis present

## 2021-06-29 DIAGNOSIS — M6281 Muscle weakness (generalized): Secondary | ICD-10-CM | POA: Insufficient documentation

## 2021-06-29 DIAGNOSIS — M25612 Stiffness of left shoulder, not elsewhere classified: Secondary | ICD-10-CM | POA: Insufficient documentation

## 2021-06-29 DIAGNOSIS — M25512 Pain in left shoulder: Secondary | ICD-10-CM | POA: Insufficient documentation

## 2021-06-29 DIAGNOSIS — R252 Cramp and spasm: Secondary | ICD-10-CM | POA: Diagnosis present

## 2021-06-29 DIAGNOSIS — R293 Abnormal posture: Secondary | ICD-10-CM | POA: Diagnosis not present
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
## 2021-06-29 NOTE — Patient Instructions (Signed)
Access Code: [email or web address] URL: [email or web address] Date: 06/29/2021 Prepared by: Kathreen Cornfield  Exercises Seated Gripping Towel - 1 x daily - 7 x weekly - 3 sets - 10 reps Supine Shoulder Flexion Extension Full Range AROM - 2 x daily - 7 x weekly - 2 sets - 10 reps Seated Single Arm Bicep Curls with Rotation and Dumbbell - 2 x daily - 7 x weekly - 2 sets - 10 reps Shoulder Flexion Wall Slide with Towel - 2 x daily - 7 x weekly - 2 sets - 10 reps Standing Shoulder Abduction Slides at Wall - 2 x daily - 7 x weekly - 2 sets - 10 reps Radial Nerve Mobilization - 1 x daily - 7 x weekly - 2 sets - 10 reps Seated Shoulder Shrugs - 2 x daily - 7 x weekly - 1-2 sets - 10 reps Seated Scapular Retraction - 2-3 x daily - 7 x weekly - 2 sets - 10 reps - 3-5 second holds hold Flexion-Extension Shoulder Pendulum with Table Support - 2 x daily - 7 x weekly - 60 seconds hold Elbow Extension PROM - 2 x daily - 7 x weekly - 3-5 sets - 10-20 seconds hold Wrist Flexion Extension AROM - Palms Down - 1 x daily - 7 x weekly - 1 sets - 10 reps

## 2021-06-30 ENCOUNTER — Ambulatory Visit: Payer: Medicaid Other | Admitting: Physical Therapy

## 2021-07-03 ENCOUNTER — Ambulatory Visit: Payer: Medicaid Other | Admitting: Physical Therapy

## 2021-07-03 ENCOUNTER — Other Ambulatory Visit: Payer: Self-pay

## 2021-07-03 ENCOUNTER — Encounter: Payer: Self-pay | Admitting: Physical Therapy

## 2021-07-03 DIAGNOSIS — M6281 Muscle weakness (generalized): Secondary | ICD-10-CM

## 2021-07-03 DIAGNOSIS — M25612 Stiffness of left shoulder, not elsewhere classified: Secondary | ICD-10-CM | POA: Diagnosis not present

## 2021-07-03 DIAGNOSIS — R293 Abnormal posture: Secondary | ICD-10-CM

## 2021-07-03 DIAGNOSIS — M25512 Pain in left shoulder: Secondary | ICD-10-CM

## 2021-07-03 DIAGNOSIS — M25622 Stiffness of left elbow, not elsewhere classified: Secondary | ICD-10-CM

## 2021-07-03 DIAGNOSIS — R252 Cramp and spasm: Secondary | ICD-10-CM

## 2021-07-03 NOTE — Therapy (Signed)
[facility] [place]. [place], [place], [address] Phone: [phone]   Fax:  [phone]  Physical Therapy Treatment  Patient Details  Name: Lynn Moody MRN: [id number] Date of Birth: 09-Aug-1971 Referring Provider (PT): Ophelia Charter, MD   Encounter Date: 07/03/2021   PT End of Session - 07/03/21 1149     Visit Number 7    Number of Visits 27    Date for PT Re-Evaluation 07/21/21    Authorization Type Medicaid [employer]    PT Start Time [phone]   pt late   PT Stop Time 1027    PT Time Calculation (min) 46 min    Equipment Utilized During Treatment --   sling   Activity Tolerance Patient tolerated treatment well    Behavior During Therapy WFL for tasks assessed/performed             Past Medical History:  Diagnosis Date   Anemia    Diabetes mellitus without complication ([place])    Hypertension     Past Surgical History:  Procedure Laterality Date   BREAST BIOPSY     left breast with maker placed   CESAREAN SECTION     DENTAL SURGERY     ORIF ELBOW FRACTURE Left 05/11/2021   Procedure: OPEN REDUCTION INTERNAL FIXATION DISTAL HUMERUS FRACTURE;  Surgeon: Hiram Gash, MD;  Location: [facility];  Service: Orthopedics;  Laterality: Left;   SHOULDER ARTHROSCOPY WITH DISTAL CLAVICLE RESECTION Left 06/28/2021   Procedure: SHOULDER ARTHROSCOPY WITH DISTAL CLAVICLE RESECTION;  Surgeon: Hiram Gash, MD;  Location: [facility];  Service: Orthopedics;  Laterality: Left;   SHOULDER ARTHROSCOPY WITH SUBACROMIAL DECOMPRESSION AND BICEP TENDON REPAIR Left 06/28/2021   Procedure: SHOULDER ARTHROSCOPY WITH SUBACROMIAL DECOMPRESSION; PARTIAL ACROMIOPLASTY WITH CORACOACROMIAL RELEASE AND  BICEP TENDODESIS;  Surgeon: Hiram Gash, MD;  Location: [facility];  Service: Orthopedics;  Laterality: Left;   ULNAR NERVE TRANSPOSITION Left 05/11/2021   Procedure: RADIAL  NERVE DECOMPRESSION;  Surgeon:  Hiram Gash, MD;  Location: [facility];  Service: Orthopedics;  Laterality: Left;    There were no vitals filed for this visit.   Subjective Assessment - 07/03/21 0942     Subjective Having some pain today likely d/t nerve block wearing off. There were some HEP exercises that she was not able to do. Notes having had some night sweats since surgery but denies chills or fever.    Pertinent History DMII, previous L shoulder accident    Diagnostic tests MRI 06/01/20:  Mild tendinosis with tenosynovitis of biceps, mild AC joint OA    Patient Stated Goals To be able to move my hand back to normal again.    Currently in Pain? Yes    Pain Score 5     Pain Location Shoulder    Pain Orientation Left    Pain Descriptors / Indicators Aching;Dull;Sore    Pain Type Surgical pain                               [facility] Adult PT Treatment/Exercise - 07/03/21 0001       Elbow Exercises   Elbow Flexion AROM;Left;10 reps;Standing   20x AROM/PROM with self-OP, 10x with 1#     Shoulder Exercises: Supine   External Rotation AAROM;10 reps;Left    External Rotation Limitations with cane to tolerance    Flexion AAROM;Left;10 reps  Flexion Limitations with cane    Other Supine Exercises L shoulder flexion AROM 5x to tolerance      Shoulder Exercises: Standing   Flexion AAROM;Left    Flexion Limitations attempted wall slides but patient could not tolerate    Other Standing Exercises L shoulder pendulums ant/pos x1 min      Vasopneumatic   Number Minutes Vasopneumatic  10 minutes    Vasopnuematic Location  Shoulder   L   Vasopneumatic Pressure Low    Vasopneumatic Temperature  34      Manual Therapy   Manual Therapy Passive ROM    Manual therapy comments supine    Passive ROM L shoulder PROM flexion, IR/ER pain-free                    PT Education - 07/03/21 1117     Education Details update to HEP- removed wall slides from previous HEP and added  supine AAROM; advised patient to call MD if she continues to experience night sweats and educated patient on s/sx of infection    Person(s) Educated Patient    Methods Explanation;Demonstration;Tactile cues;Verbal cues;Handout    Comprehension Verbalized understanding;Returned demonstration              PT Short Term Goals - 07/03/21 1152       PT SHORT TERM GOAL #1   Title Pt will be independent with initial HEP.    Status Achieved      PT SHORT TERM GOAL #2   Title Pt will verbalize techinques to address swelling and inflammation (PRICE).    Time 3    Period Weeks    Status On-going    Target Date 07/20/21      PT SHORT TERM GOAL #3   Title Pt will be able to sleep through the night with appropriate props for shoulder.    Baseline slept sitting up last night    Time 3    Period Weeks    Status On-going    Target Date 07/20/21               PT Long Term Goals - 06/29/21 1402       PT LONG TERM GOAL #1   Title Pt will be independent with advanced HEP.    Status On-going      PT LONG TERM GOAL #2   Title Patient will increase LUE AROM to [facility] to allow her to perform functional activities around home.    Status On-going      PT LONG TERM GOAL #3   Title Patient will increase LUE strength to at least 4+/5 to allow her to perform tasks in her home.    Baseline NT    Status On-going      PT LONG TERM GOAL #4   Title Patient will increase L grip strength to within 10 pounds ot R grip strength to allow her to open containers.    Status On-going                   Plan - 07/03/21 1150     Clinical Impression Statement Patient arrived to session with report of moderate L shoulder pain and some difficulty with HEP. Reports that she removed bandaging from the L shoulder, which revealed bloody steri strips. Patient reported some night sweats since her surgery but denied fever or chills. Advised patient that if this continues, to contact MD d/t concern of  infection. Patient reported understanding.  Patient demonstrated improved muscle guarding with PROM, AAROM, and AROM today, reaching ~85-90 degrees of flexion. Updated HEP with flexion and IR/ER AAROM as this was well-tolerated today and removed shoulder abduction and flexion wall slides from HEP. Ended session with [place] to L shoulder for post-exercise soreness. Patient without complaints at end of session.    Personal Factors and Comorbidities Fitness    Examination-Activity Limitations Reach Overhead;Dressing;Hygiene/Grooming;Carry;Sleep;Lift    Examination-Participation Restrictions Occupation;Driving;Meal Prep;Laundry;Cleaning    Stability/Clinical Decision Making Stable/Uncomplicated    Rehab Potential Good    PT Frequency 2x / week    PT Duration 8 weeks    PT Treatment/Interventions ADLs/Self Care Home Management;Cryotherapy;Electrical Stimulation;Iontophoresis '4mg'$ /ml Dexamethasone;Moist Heat;Ultrasound;Functional mobility training;Therapeutic activities;Therapeutic exercise;Neuromuscular re-education;Patient/family education;Manual techniques;Scar mobilization;Passive range of motion;Dry needling;Taping;Joint Manipulations;Vasopneumatic Device;Spinal Manipulations    PT Next Visit Plan Reassess update to HEP, avoid abduction and cross body adduction for 6 weeks (per protocol), follow protocol under media, manual therapy and modalities as indicated, PROM, gentle periscapular strengthening    PT Home Exercise Plan Access Code: [email or web address]    Consulted and Agree with Plan of Care Patient             Patient will benefit from skilled therapeutic intervention in order to improve the following deficits and impairments:  Decreased range of motion, Increased muscle spasms, Impaired UE functional use, Decreased activity tolerance, Pain, Decreased strength, Postural dysfunction, Increased fascial restricitons, Impaired perceived functional ability  Visit Diagnosis: Stiffness of left shoulder,  not elsewhere classified  Stiffness of left elbow, not elsewhere classified  Acute pain of left shoulder  Muscle weakness (generalized)  Abnormal posture  Cramp and spasm     Problem List Patient Active Problem List   Diagnosis Date Noted   History of miscarriage 08/19/2015   Elevated WBC count 05/18/2015   Pneumococcal vaccination declined 03/15/2015   Diabetes mellitus, type 2 ([place]) 12/23/2014   IMPAIRED GLUCOSE TOLERANCE TEST 10/13/2010     Janene Harvey, PT, DPT 07/03/21 11:54 AM   [place]. [place], [place], [address] Phone: [phone]   Fax:  [phone]  Name: Lynn Moody MRN: [id number] Date of Birth: 1970-12-24

## 2021-07-03 NOTE — Patient Instructions (Signed)
Access Code: [email or web address] URL: [email or web address] Date: 07/03/2021 Prepared by: Grayling Congress  Exercises Flexion-Extension Shoulder Pendulum with Table Support - 2 x daily - 7 x weekly - 60 seconds hold Supine Shoulder Flexion Extension AAROM with Dowel - 2 x daily - 7 x weekly - 2 sets - 10 reps Supine Shoulder External Rotation AAROM with Dowel - 2 x daily - 7 x weekly - 2 sets - 10 reps Supine Shoulder Flexion Extension Full Range AROM - 2 x daily - 7 x weekly - 2 sets - 5 reps Seated Gripping Towel - 1 x daily - 7 x weekly - 3 sets - 10 reps Seated Single Arm Bicep Curls with Rotation and Dumbbell - 2 x daily - 7 x weekly - 2 sets - 10 reps Radial Nerve Mobilization - 1 x daily - 7 x weekly - 2 sets - 10 reps Seated Shoulder Shrugs - 2 x daily - 7 x weekly - 1-2 sets - 10 reps Seated Scapular Retraction - 2-3 x daily - 7 x weekly - 2 sets - 10 reps - 3-5 second holds hold Elbow Extension PROM - 2 x daily - 7 x weekly - 3-5 sets - 10-20 seconds hold Wrist Flexion Extension AROM - [place] - 1 x daily - 7 x weekly - 1 sets - 10 reps

## 2021-07-05 NOTE — Op Note (Signed)
Orthopaedic Surgery Operative Note (CSN: [id number])  Lynn Moody  1971-04-25 Date of Surgery: 06/28/2021   Diagnoses:  Left shoulder impingement, SLAP tear and AC arthrosis  Procedure: Arthroscopic extensive debridement Arthroscopic subacromial decompression Arthroscopic biceps tenodesis Arthroscopic distal clavicle excision Lysis of adhesions with manipulation under anesthesia   Operative Finding Patient had some limitations in forward flexion and we released her capsule anteriorly and inferiorly as she had only about 115 120 degrees forward flexion.  We then performed a lysis and manipulation had full motion.  SLAP tear was noted and we performed a penetrating type biceps tenodesis to discontinue the sling early.  Successful completion of the planned procedure.   Post-operative plan: The patient will be non-weightbearing in a sling for a week and then transition out of the sling due to her stiffness.  The patient will be discharged home.  DVT prophylaxis not indicated in ambulatory upper extremity patient without known risk factors.   Pain control with PRN pain medication preferring oral medicines.  Follow up plan will be scheduled in approximately 7 days for incision check and XR.  Post-Op Diagnosis: Same Surgeons:Primary: Hiram Gash, MD Assistants:Caroline McBane PA-C Location: [facility] OR ROOM 6 Anesthesia: General with Exparel interscalene block Antibiotics: Ancef 2 g Tourniquet time: None Estimated Blood Loss: Minimal Complications: None Specimens: None Implants: Implant Name Type Inv. Item Serial No. Manufacturer Lot No. LRB No. Used Action  ANCHOR SUT 1.8 FBRTK KNTLS 2SU - [id number] Anchor ANCHOR SUT 1.8 FBRTK KNTLS 2SU  [employer] [id number] Left 1 Implanted  ANCHOR SUT 1.8 FBRTK KNTLS 2SU - [id number] Anchor ANCHOR SUT 1.8 FBRTK [employer] [id number] Left 1 Implanted    Indications for Surgery:   Lynn Moody is a 50 y.o. female with continued shoulder  pain refractory to nonoperative measures for extended period of time.   the risks and benefits were explained at length including but not limited to continued pain, cuff failure, biceps tenodesis failure, stiffness, need for further surgery and infection.   Procedure:   Patient was correctly identified in the preoperative holding area and operative site marked.  Patient brought to OR and positioned beachchair on an Bascom table ensuring that all bony prominences were padded and the head was in an appropriate location.  Anesthesia was induced and the operative shoulder was prepped and draped in the usual sterile fashion.  Timeout was called preincision.  A standard posterior viewing portal was made after localizing the portal with a spinal needle.  An anterior accessory portal was also made.  After clearing the articular space the camera was positioned in the subacromial space.  Findings above.    Extensive debridement was performed of the anterior interval tissue, labral fraying and the bursa.  We identified the thickened MGHL and released this with a RF ablator.  We then used the RF ablator to skeletonize the anterior interval.  Once was complete we used a combination of the RF ablator as well as the basket device to complete a capsular release along the inferior half of the glenoid just off the glenoid labrum.  We stayed close to the labrum to avoid neurovascular damage.  At that point we removed all instruments and were able to manipulate the shoulder and demonstrate that there was improvement in range of motion as documented above.  We placed the scope back into the shoulder to ensure that there were no damage to the cuff or other structures in the shoulder.   Subacromial decompression:  We made a lateral portal with spinal needle guidance. We then proceeded to debride bursal tissue extensively with a shaver and arthrocare device. At that point we continued to identify the borders of the acromion and  identify the spur. We then carefully preserved the deltoid fascia and used a burr to convert the acromion to a Type 1 flat acromion without issue.  Biceps tenodesis: We marked the tendon and then performed a tenotomy and debridement of the stump in the articular space. We then identified the biceps tendon in its groove suprapec with the arthroscope in the lateral portal taking care to move from lateral to medial to avoid injury to the subscapularis. At that point we unroofed the tendon itself and mobilized it. An accessory anterior portal was made in line with the tendon and we grasped it from the anterior superior portal and worked from the accessory anterior portal. Two Fibertak 1.1m knotless anchors were placed in the groove and the tendon was secured in a luggage loop style fashion with a pass of the limb of suture through the tendon using a scorpion device to avoid pull-through.  Repair was completed with good tension on the tendon.  Residual stump of the tendon was removed after being resected with a RF ablator.  Distal Clavicle resection:  The scope was placed in the subacromial space from the posterior portal.  A hemostat was placed through the anterior portal and [facility].  A burr was then inserted and 10 mm of distal clavicle was resected taking care to avoid damage to the capsule around the joint and avoiding overhanging bone posteriorly.      The incisions were closed with absorbable monocryl and steri strips.  A sterile dressing was placed along with a sling. The patient was awoken from general anesthesia and taken to the PACU in stable condition without complication.   CNoemi Chapel PA-C, present and scrubbed throughout the case, critical for completion in a timely fashion, and for retraction, instrumentation, closure.

## 2021-07-06 ENCOUNTER — Other Ambulatory Visit: Payer: Self-pay

## 2021-07-06 ENCOUNTER — Encounter: Payer: Self-pay | Admitting: Physical Therapy

## 2021-07-06 ENCOUNTER — Ambulatory Visit: Payer: Medicaid Other | Admitting: Physical Therapy

## 2021-07-06 DIAGNOSIS — M6281 Muscle weakness (generalized): Secondary | ICD-10-CM

## 2021-07-06 DIAGNOSIS — R293 Abnormal posture: Secondary | ICD-10-CM

## 2021-07-06 DIAGNOSIS — M25612 Stiffness of left shoulder, not elsewhere classified: Secondary | ICD-10-CM | POA: Diagnosis not present

## 2021-07-06 DIAGNOSIS — M25622 Stiffness of left elbow, not elsewhere classified: Secondary | ICD-10-CM

## 2021-07-06 DIAGNOSIS — R252 Cramp and spasm: Secondary | ICD-10-CM

## 2021-07-06 DIAGNOSIS — M25512 Pain in left shoulder: Secondary | ICD-10-CM

## 2021-07-06 NOTE — Therapy (Signed)
[facility]. [place], [place], [address] Phone: [phone]   Fax:  [phone]  Physical Therapy Treatment  Patient Details  Name: Lynn Moody MRN: [id number] Date of Birth: 21-Oct-1971 Referring Provider (PT): Ophelia Charter, MD   Encounter Date: 07/06/2021   PT End of Session - 07/06/21 0838     Visit Number 8    Number of Visits 27    Date for PT Re-Evaluation 07/21/21    Authorization Type Medicaid [employer]    PT Start Time 0812    PT Stop Time 0846    PT Time Calculation (min) 34 min    Activity Tolerance Patient tolerated treatment well    Behavior During Therapy [facility] for tasks assessed/performed             Past Medical History:  Diagnosis Date   Anemia    Diabetes mellitus without complication ([place])    Hypertension     Past Surgical History:  Procedure Laterality Date   BREAST BIOPSY     left breast with maker placed   CESAREAN SECTION     DENTAL SURGERY     ORIF ELBOW FRACTURE Left 05/11/2021   Procedure: OPEN REDUCTION INTERNAL FIXATION DISTAL HUMERUS FRACTURE;  Surgeon: Hiram Gash, MD;  Location: [facility];  Service: Orthopedics;  Laterality: Left;   SHOULDER ARTHROSCOPY WITH DISTAL CLAVICLE RESECTION Left 06/28/2021   Procedure: SHOULDER ARTHROSCOPY WITH DISTAL CLAVICLE RESECTION;  Surgeon: Hiram Gash, MD;  Location: [facility];  Service: Orthopedics;  Laterality: Left;   SHOULDER ARTHROSCOPY WITH SUBACROMIAL DECOMPRESSION AND BICEP TENDON REPAIR Left 06/28/2021   Procedure: SHOULDER ARTHROSCOPY WITH SUBACROMIAL DECOMPRESSION; PARTIAL ACROMIOPLASTY WITH CORACOACROMIAL RELEASE AND  BICEP TENDODESIS;  Surgeon: Hiram Gash, MD;  Location: [facility];  Service: Orthopedics;  Laterality: Left;   ULNAR NERVE TRANSPOSITION Left 05/11/2021   Procedure: RADIAL  NERVE DECOMPRESSION;  Surgeon: Hiram Gash, MD;  Location: [facility];   Service: Orthopedics;  Laterality: Left;    There were no vitals filed for this visit.   Subjective Assessment - 07/06/21 0813     Subjective Having some pain today    Currently in Pain? Yes    Pain Score 3     Pain Location Arm    Pain Orientation Left                               [facility] Adult PT Treatment/Exercise - 07/06/21 0001       Elbow Exercises   Elbow Flexion AROM;Left;Standing;20 reps      Shoulder Exercises: Supine   External Rotation --    Flexion --    ABduction --      Shoulder Exercises: Standing   External Rotation AROM;Both;20 reps    Internal Rotation Both;20 reps;AROM    Flexion AAROM;Left;10 reps      Vasopneumatic   Number Minutes Vasopneumatic  10 minutes    Vasopnuematic Location  Shoulder    Vasopneumatic Pressure Low    Vasopneumatic Temperature  34      Manual Therapy   Manual Therapy Passive ROM    Manual therapy comments supine    Passive ROM L shoulder PROM flexion, IR/ER pain-free                      PT Short Term Goals - 07/03/21 1152  PT SHORT TERM GOAL #1   Title Pt will be independent with initial HEP.    Status Achieved      PT SHORT TERM GOAL #2   Title Pt will verbalize techinques to address swelling and inflammation (PRICE).    Time 3    Period Weeks    Status On-going    Target Date 07/20/21      PT SHORT TERM GOAL #3   Title Pt will be able to sleep through the night with appropriate props for shoulder.    Baseline slept sitting up last night    Time 3    Period Weeks    Status On-going    Target Date 07/20/21               PT Long Term Goals - 06/29/21 1402       PT LONG TERM GOAL #1   Title Pt will be independent with advanced HEP.    Status On-going      PT LONG TERM GOAL #2   Title Patient will increase LUE AROM to [facility] to allow her to perform functional activities around home.    Status On-going      PT LONG TERM GOAL #3   Title Patient will increase  LUE strength to at least 4+/5 to allow her to perform tasks in her home.    Baseline NT    Status On-going      PT LONG TERM GOAL #4   Title Patient will increase L grip strength to within 10 pounds ot R grip strength to allow her to open containers.    Status On-going                   Plan - 07/06/21 0839     Clinical Impression Statement Pt ~ 12 minutes late for today's session reporting L shoulder and elbow pain. She was very guarded with all active and active assisted ROM. L shoulder elevation noted with active assisted flexion. Cues needed to relax during MT due to guarding. L elbow pain reported with passive extension.    Personal Factors and Comorbidities Fitness    Examination-Activity Limitations Reach Overhead;Dressing;Hygiene/Grooming;Carry;Sleep;Lift    Examination-Participation Restrictions Occupation;Driving;Meal Prep;Laundry;Cleaning    Stability/Clinical Decision Making Stable/Uncomplicated    Rehab Potential Good    PT Frequency 2x / week    PT Duration 8 weeks    PT Treatment/Interventions ADLs/Self Care Home Management;Cryotherapy;Electrical Stimulation;Iontophoresis '4mg'$ /ml Dexamethasone;Moist Heat;Ultrasound;Functional mobility training;Therapeutic activities;Therapeutic exercise;Neuromuscular re-education;Patient/family education;Manual techniques;Scar mobilization;Passive range of motion;Dry needling;Taping;Joint Manipulations;Vasopneumatic Device;Spinal Manipulations    PT Next Visit Plan Reassess update to HEP, avoid abduction and cross body adduction for 6 weeks (per protocol), follow protocol under media, manual therapy and modalities as indicated, PROM, gentle periscapular strengthening             Patient will benefit from skilled therapeutic intervention in order to improve the following deficits and impairments:  Decreased range of motion, Increased muscle spasms, Impaired UE functional use, Decreased activity tolerance, Pain, Decreased strength,  Postural dysfunction, Increased fascial restricitons, Impaired perceived functional ability  Visit Diagnosis: Stiffness of left shoulder, not elsewhere classified  Stiffness of left elbow, not elsewhere classified  Acute pain of left shoulder  Muscle weakness (generalized)  Abnormal posture  Cramp and spasm     Problem List Patient Active Problem List   Diagnosis Date Noted   History of miscarriage 08/19/2015   Elevated WBC count 05/18/2015   Pneumococcal vaccination declined 03/15/2015  Diabetes mellitus, type 2 ([place]) 12/23/2014   IMPAIRED GLUCOSE TOLERANCE TEST 10/13/2010    Scot Jun 07/06/2021, 8:45 AM  [facility]. [place], [place], [address] Phone: [phone]   Fax:  [phone]  Name: Lynn Moody MRN: [id number] Date of Birth: Jun 29, 1971

## 2021-07-11 ENCOUNTER — Ambulatory Visit: Payer: Medicaid Other | Admitting: Physical Therapy

## 2021-07-11 ENCOUNTER — Encounter: Payer: Self-pay | Admitting: Physical Therapy

## 2021-07-11 ENCOUNTER — Other Ambulatory Visit: Payer: Self-pay

## 2021-07-11 DIAGNOSIS — R293 Abnormal posture: Secondary | ICD-10-CM

## 2021-07-11 DIAGNOSIS — M25622 Stiffness of left elbow, not elsewhere classified: Secondary | ICD-10-CM

## 2021-07-11 DIAGNOSIS — M25612 Stiffness of left shoulder, not elsewhere classified: Secondary | ICD-10-CM

## 2021-07-11 DIAGNOSIS — R252 Cramp and spasm: Secondary | ICD-10-CM

## 2021-07-11 DIAGNOSIS — M6281 Muscle weakness (generalized): Secondary | ICD-10-CM

## 2021-07-11 DIAGNOSIS — M25512 Pain in left shoulder: Secondary | ICD-10-CM

## 2021-07-11 NOTE — Therapy (Signed)
[place] [place], [place], [address] Phone: [phone]   Fax:  [phone]  Physical Therapy Treatment  Patient Details  Name: Lynn Moody MRN: [id number] Date of Birth: [birth year] Referring Provider (PT): Ophelia Charter, MD   Encounter Date: 07/11/2021   PT End of Session - 07/11/21 1012     Visit Number 9    Number of Visits 27    Date for PT Re-Evaluation 07/21/21    Authorization Type Medicaid [employer]    PT Start Time 0930    PT Stop Time 1020    PT Time Calculation (min) 50 min    Activity Tolerance Patient tolerated treatment well;Patient limited by pain    Behavior During Therapy [facility] for tasks assessed/performed             Past Medical History:  Diagnosis Date   Anemia    Diabetes mellitus without complication ([place])    Hypertension     Past Surgical History:  Procedure Laterality Date   BREAST BIOPSY     left breast with maker placed   CESAREAN SECTION     DENTAL SURGERY     ORIF ELBOW FRACTURE Left 05/11/2021   Procedure: OPEN REDUCTION INTERNAL FIXATION DISTAL HUMERUS FRACTURE;  Surgeon: Hiram Gash, MD;  Location: [place];  Service: Orthopedics;  Laterality: Left;   SHOULDER ARTHROSCOPY WITH DISTAL CLAVICLE RESECTION Left 06/28/2021   Procedure: SHOULDER ARTHROSCOPY WITH DISTAL CLAVICLE RESECTION;  Surgeon: Hiram Gash, MD;  Location: [place];  Service: Orthopedics;  Laterality: Left;   SHOULDER ARTHROSCOPY WITH SUBACROMIAL DECOMPRESSION AND BICEP TENDON REPAIR Left 06/28/2021   Procedure: SHOULDER ARTHROSCOPY WITH SUBACROMIAL DECOMPRESSION; PARTIAL ACROMIOPLASTY WITH CORACOACROMIAL RELEASE AND  BICEP TENDODESIS;  Surgeon: Hiram Gash, MD;  Location: [place];  Service: Orthopedics;  Laterality: Left;   ULNAR NERVE TRANSPOSITION Left 05/11/2021   Procedure: RADIAL  NERVE DECOMPRESSION;  Surgeon: Hiram Gash, MD;  Location: [place];  Service: Orthopedics;  Laterality: Left;    There were no vitals filed for this visit.   Subjective Assessment - 07/11/21 0931     Subjective Feels like she did a little too much movement yesterday- did some organizing and cleaning. Went to sleep and woke up in pain but took some pain meds before session.    Pertinent History DMII, previous L shoulder accident    Diagnostic tests MRI 06/01/20:  Mild tendinosis with tenosynovitis of biceps, mild AC joint OA    Patient Stated Goals To be able to move my hand back to normal again.    Currently in Pain? Yes    Pain Score 4     Pain Location Shoulder    Pain Orientation Left;Posterior    Pain Descriptors / Indicators Sharp    Pain Type Surgical pain;Acute pain    Multiple Pain Sites Yes    Pain Score 4    Pain Location Elbow    Pain Orientation Left    Pain Descriptors / Indicators Sharp    Pain Type Acute pain;Surgical pain                               [facility] Adult PT Treatment/Exercise - 07/11/21 0001       Self-Care   Self-Care Other Self-Care Comments    Other Self-Care Comments  edu and practice on self-STM ball  on wall to L rhomboids      Elbow Exercises   Elbow Flexion AROM;Left;Standing;15 reps      Shoulder Exercises: Supine   Other Supine Exercises L shoulder flexion AROM 2x5 to tolerance      Shoulder Exercises: Seated   Other Seated Exercises scap retraction 10x3"      Shoulder Exercises: Sidelying   External Rotation AROM;Left;10 reps    External Rotation Limitations ROM to tolerance      Shoulder Exercises: Standing   Other Standing Exercises L elbow pronation/supination AROM x15    Other Standing Exercises L shoulder pendulums ant/pos, CW, CCW, M/L 30" each   cues to decrease AROM     Vasopneumatic   Number Minutes Vasopneumatic  10 minutes    Vasopnuematic Location  Shoulder   L   Vasopneumatic Pressure Low    Vasopneumatic Temperature  34      Manual Therapy    Manual Therapy Passive ROM    Manual therapy comments supine    Joint Mobilization gentle grade II/III to shoulder AP, PA, inferior to tolerance    Passive ROM L shoulder PROM flexion, IR/ER pain-free; L elbow flexion, extension, pronation/supination   mostpain in L elbow extension and supination                   PT Education - 07/11/21 1010     Education Details review of post-op precautions and advised to remove resisted L elbow flexion from HEP; edu on proper ergonomic desk set up and to avoid driving until cleared by MD    Person(s) Educated Patient    Methods Explanation;Demonstration;Tactile cues;Verbal cues    Comprehension Verbalized understanding;Returned demonstration              PT Short Term Goals - 07/03/21 1152       PT SHORT TERM GOAL #1   Title Pt will be independent with initial HEP.    Status Achieved      PT SHORT TERM GOAL #2   Title Pt will verbalize techinques to address swelling and inflammation (PRICE).    Time 3    Period Weeks    Status On-going    Target Date 07/20/21      PT SHORT TERM GOAL #3   Title Pt will be able to sleep through the night with appropriate props for shoulder.    Baseline slept sitting up last night    Time 3    Period Weeks    Status On-going    Target Date 07/20/21               PT Long Term Goals - 06/29/21 1402       PT LONG TERM GOAL #1   Title Pt will be independent with advanced HEP.    Status On-going      PT LONG TERM GOAL #2   Title Patient will increase LUE AROM to [facility] to allow her to perform functional activities around home.    Status On-going      PT LONG TERM GOAL #3   Title Patient will increase LUE strength to at least 4+/5 to allow her to perform tasks in her home.    Baseline NT    Status On-going      PT LONG TERM GOAL #4   Title Patient will increase L grip strength to within 10 pounds ot R grip strength to allow her to open containers.    Status On-going  Plan - 07/11/21 1012     Clinical Impression Statement Patient arrived to session with report of moderate L shoulder and elbow pain after doing some things around the house yesterday. Patient out of sling today- notes that she started weaning out of it last weekend. Identified L rhomboids as area of pain, thus educated patient on proper posture and gentle periscapular/postural activation exercises as well as use of self-STM to address this area of pain at home. Patient tolerated L shoulder and elbow PROM, while following post-op precautions. Most pain was reported in L elbow extension and supination. Reviewed post-op precautions with patient and advised to discontinue resisted elbow flexion d/t biceps tenodesis precautions. Patient reported understanding. Shoulder AROM was performed on mat within limited ROM however with improved tolerance compared to previous sessions. Ended session with [employer] to L shoulder for post-exercise soreness. No further complaints at end of session.    Personal Factors and Comorbidities Fitness    Examination-Activity Limitations Reach Overhead;Dressing;Hygiene/Grooming;Carry;Sleep;Lift    Examination-Participation Restrictions Occupation;Driving;Meal Prep;Laundry;Cleaning    Stability/Clinical Decision Making Stable/Uncomplicated    Rehab Potential Good    PT Frequency 2x / week    PT Duration 8 weeks    PT Treatment/Interventions ADLs/Self Care Home Management;Cryotherapy;Electrical Stimulation;Iontophoresis '4mg'$ /ml Dexamethasone;Moist Heat;Ultrasound;Functional mobility training;Therapeutic activities;Therapeutic exercise;Neuromuscular re-education;Patient/family education;Manual techniques;Scar mobilization;Passive range of motion;Dry needling;Taping;Joint Manipulations;Vasopneumatic Device;Spinal Manipulations    PT Next Visit Plan Reassess update to HEP, avoid abduction and cross body adduction for 6 weeks (per protocol), follow protocol under  media, manual therapy and modalities as indicated, PROM, gentle periscapular strengthening    Consulted and Agree with Plan of Care Patient             Patient will benefit from skilled therapeutic intervention in order to improve the following deficits and impairments:  Decreased range of motion, Increased muscle spasms, Impaired UE functional use, Decreased activity tolerance, Pain, Decreased strength, Postural dysfunction, Increased fascial restricitons, Impaired perceived functional ability  Visit Diagnosis: Stiffness of left shoulder, not elsewhere classified  Stiffness of left elbow, not elsewhere classified  Acute pain of left shoulder  Muscle weakness (generalized)  Abnormal posture  Cramp and spasm     Problem List Patient Active Problem List   Diagnosis Date Noted   History of miscarriage 08/19/2015   Elevated WBC count 05/18/2015   Pneumococcal vaccination declined 03/15/2015   Diabetes mellitus, type 2 ([place]) 12/23/2014   IMPAIRED GLUCOSE TOLERANCE TEST 10/13/2010    Janene Harvey, PT, DPT 07/11/21 10:26 AM   [place]. [place], [place], [address] Phone: [phone]   Fax:  [phone]  Name: Lynn Moody MRN: [id number] Date of Birth: Jun 17, 1971

## 2021-07-13 ENCOUNTER — Ambulatory Visit: Payer: Medicaid Other

## 2021-07-13 ENCOUNTER — Other Ambulatory Visit: Payer: Self-pay

## 2021-07-13 DIAGNOSIS — M25622 Stiffness of left elbow, not elsewhere classified: Secondary | ICD-10-CM

## 2021-07-13 DIAGNOSIS — M25612 Stiffness of left shoulder, not elsewhere classified: Secondary | ICD-10-CM | POA: Diagnosis not present

## 2021-07-13 DIAGNOSIS — M6281 Muscle weakness (generalized): Secondary | ICD-10-CM

## 2021-07-13 DIAGNOSIS — R293 Abnormal posture: Secondary | ICD-10-CM

## 2021-07-13 DIAGNOSIS — M25512 Pain in left shoulder: Secondary | ICD-10-CM

## 2021-07-13 DIAGNOSIS — R252 Cramp and spasm: Secondary | ICD-10-CM

## 2021-07-13 NOTE — Therapy (Signed)
[place] [place], [place], [address] Phone: [phone]   Fax:  [phone]  Physical Therapy Treatment 10th visit Progress Note Reporting Period 06/01/21  to 07/13/21 for visits 1-10, today is visit #5 post op  See note below for Objective Data and Assessment of Progress/Goals.     Patient Details  Name: Lynn Moody MRN: [id number] Date of Birth: 02-23-1971 Referring Provider (PT): Ophelia Charter, MD   Encounter Date: 07/13/2021   PT End of Session - 07/13/21 1034     Visit Number 10    Number of Visits 27    Date for PT Re-Evaluation 07/21/21    Authorization Type Medicaid [employer]    PT Start Time 1008    PT Stop Time 1046    PT Time Calculation (min) 38 min    Activity Tolerance Patient tolerated treatment well;Patient limited by pain    Behavior During Therapy WFL for tasks assessed/performed             Past Medical History:  Diagnosis Date   Anemia    Diabetes mellitus without complication ([place])    Hypertension     Past Surgical History:  Procedure Laterality Date   BREAST BIOPSY     left breast with maker placed   CESAREAN SECTION     DENTAL SURGERY     ORIF ELBOW FRACTURE Left 05/11/2021   Procedure: OPEN REDUCTION INTERNAL FIXATION DISTAL HUMERUS FRACTURE;  Surgeon: Hiram Gash, MD;  Location: [place];  Service: Orthopedics;  Laterality: Left;   SHOULDER ARTHROSCOPY WITH DISTAL CLAVICLE RESECTION Left 06/28/2021   Procedure: SHOULDER ARTHROSCOPY WITH DISTAL CLAVICLE RESECTION;  Surgeon: Hiram Gash, MD;  Location: [place];  Service: Orthopedics;  Laterality: Left;   SHOULDER ARTHROSCOPY WITH SUBACROMIAL DECOMPRESSION AND BICEP TENDON REPAIR Left 06/28/2021   Procedure: SHOULDER ARTHROSCOPY WITH SUBACROMIAL DECOMPRESSION; PARTIAL ACROMIOPLASTY WITH CORACOACROMIAL RELEASE AND  BICEP TENDODESIS;  Surgeon: Hiram Gash, MD;  Location: [place];  Service: Orthopedics;  Laterality: Left;   ULNAR NERVE TRANSPOSITION Left 05/11/2021   Procedure: RADIAL  NERVE DECOMPRESSION;  Surgeon: Hiram Gash, MD;  Location: [place];  Service: Orthopedics;  Laterality: Left;    There were no vitals filed for this visit.   Subjective Assessment - 07/13/21 1013     Subjective A little more pain today but thinks its because we switched to acetominophen not as strong.    Pertinent History DMII, previous L shoulder accident    Diagnostic tests MRI 06/01/20:  Mild tendinosis with tenosynovitis of biceps, mild AC joint OA    Patient Stated Goals To be able to move my hand back to normal again.    Currently in Pain? Yes    Pain Score 6     Pain Location Shoulder    Pain Orientation Left                               [facility] Adult PT Treatment/Exercise - 07/13/21 0001       Elbow Exercises   Elbow Flexion AROM;Left;Standing;20 reps   2 x 10     Shoulder Exercises: Supine   Other Supine Exercises L shoulder flexion AROM 2x5 to tolerance      Shoulder Exercises: Seated   Other Seated Exercises scap retraction 2x10x3"      Shoulder Exercises: Sidelying  External Rotation AROM;Left;10 reps      Shoulder Exercises: Standing   Other Standing Exercises L elbow pronation/supination AROM 2 x 10      Shoulder Exercises: ROM/Strengthening   Pendulum 30" M-L and 30" A-P      Vasopneumatic   Number Minutes Vasopneumatic  10 minutes    Vasopnuematic Location  Shoulder   L   Vasopneumatic Pressure Low    Vasopneumatic Temperature  34      Manual Therapy   Manual Therapy Passive ROM    Manual therapy comments supine    Joint Mobilization gentle grade II/III to shoulder AP, PA, inferior to tolerance. gentle distraction with some relief    Passive ROM L shoulder PROM flexion, IR/ER pain-free; L elbow flexion, extension, pronation/supination   mostpain in L elbow extension and supination                      PT Short Term Goals - 07/13/21 1037       PT SHORT TERM GOAL #1   Title Pt will be independent with initial HEP.    Status Achieved      PT SHORT TERM GOAL #2   Title Pt will verbalize techinques to address swelling and inflammation (PRICE).    Time 3    Period Weeks    Status Achieved    Target Date 07/20/21      PT SHORT TERM GOAL #3   Title Pt will be able to sleep through the night with appropriate props for shoulder.    Baseline slept sitting up last night    Time 3    Period Weeks    Status Partially Met   past few nights were rough but wasnt too bad before then   Target Date 07/20/21               PT Long Term Goals - 06/29/21 1402       PT LONG TERM GOAL #1   Title Pt will be independent with advanced HEP.    Status On-going      PT LONG TERM GOAL #2   Title Patient will increase LUE AROM to [facility] to allow her to perform functional activities around home.    Status On-going      PT LONG TERM GOAL #3   Title Patient will increase LUE strength to at least 4+/5 to allow her to perform tasks in her home.    Baseline NT    Status On-going      PT LONG TERM GOAL #4   Title Patient will increase L grip strength to within 10 pounds ot R grip strength to allow her to open containers.    Status On-going                   Plan - 07/13/21 1035     Clinical Impression Statement Pt making gradual progress towards goals. She is currently 2 weeks p/o. She reports a being more aware of pain, a little higher and attributes this to switching from prescription pain medication to acetominophen. PROM/AROM in supine shoulder gradually improving, currently 100 deg flexion supine. Elbow ROM progressing as well just with some discomfort in the posterior arm/elbow with flexion. Does demo some tightness at superior and anterior shoulder and continued edema, responded nicely to gentle jt mobs and gentle GHJ distraction in adddition to vaso for the L  shoulder at end of session.  Progressing as per rehab protocol overall.  Personal Factors and Comorbidities Fitness    Examination-Activity Limitations Reach Overhead;Dressing;Hygiene/Grooming;Carry;Sleep;Lift    Examination-Participation Restrictions Occupation;Driving;Meal Prep;Laundry;Cleaning    Stability/Clinical Decision Making Stable/Uncomplicated    Rehab Potential Good    PT Frequency 2x / week    PT Duration 8 weeks    PT Treatment/Interventions ADLs/Self Care Home Management;Cryotherapy;Electrical Stimulation;Iontophoresis 52m/ml Dexamethasone;Moist Heat;Ultrasound;Functional mobility training;Therapeutic activities;Therapeutic exercise;Neuromuscular re-education;Patient/family education;Manual techniques;Scar mobilization;Passive range of motion;Dry needling;Taping;Joint Manipulations;Vasopneumatic Device;Spinal Manipulations    PT Next Visit Plan Reassess update to HEP, avoid abduction and cross body adduction for 6 weeks (per protocol), follow protocol under media, manual therapy and modalities as indicated, PROM, gentle periscapular strengthening    Consulted and Agree with Plan of Care Patient             Patient will benefit from skilled therapeutic intervention in order to improve the following deficits and impairments:  Decreased range of motion, Increased muscle spasms, Impaired UE functional use, Decreased activity tolerance, Pain, Decreased strength, Postural dysfunction, Increased fascial restricitons, Impaired perceived functional ability  Visit Diagnosis: Stiffness of left shoulder, not elsewhere classified  Stiffness of left elbow, not elsewhere classified  Acute pain of left shoulder  Muscle weakness (generalized)  Abnormal posture  Cramp and spasm     Problem List Patient Active Problem List   Diagnosis Date Noted   History of miscarriage 08/19/2015   Elevated WBC count 05/18/2015   Pneumococcal vaccination declined 03/15/2015   Diabetes  mellitus, type 2 ([place] 12/23/2014   IMPAIRED GLUCOSE TOLERANCE TEST 10/13/2010    MHall Busing PT, DPT 07/13/2021, 10:41 AM  [place] [address]Phone: [phone]  Fax:  [phone] Name: Lynn COLLERMRN: [id number] of Birth: 802/23/1972

## 2021-07-17 ENCOUNTER — Encounter: Payer: Self-pay | Admitting: Physical Therapy

## 2021-07-17 ENCOUNTER — Ambulatory Visit: Payer: Medicaid Other | Admitting: Physical Therapy

## 2021-07-17 ENCOUNTER — Other Ambulatory Visit: Payer: Self-pay

## 2021-07-17 DIAGNOSIS — M25612 Stiffness of left shoulder, not elsewhere classified: Secondary | ICD-10-CM

## 2021-07-17 DIAGNOSIS — M6281 Muscle weakness (generalized): Secondary | ICD-10-CM

## 2021-07-17 DIAGNOSIS — M25512 Pain in left shoulder: Secondary | ICD-10-CM

## 2021-07-17 DIAGNOSIS — M25622 Stiffness of left elbow, not elsewhere classified: Secondary | ICD-10-CM

## 2021-07-17 DIAGNOSIS — R252 Cramp and spasm: Secondary | ICD-10-CM

## 2021-07-17 DIAGNOSIS — R293 Abnormal posture: Secondary | ICD-10-CM

## 2021-07-17 NOTE — Therapy (Signed)
[place] [place], [place], [address] Phone: [phone]   Fax:  [phone]  Physical Therapy Treatment  Patient Details  Name: Lynn Moody MRN: [id number] Date of Birth: [birth year] Referring Provider (PT): Ophelia Charter, MD   Encounter Date: 07/17/2021   PT End of Session - 07/17/21 1019     Visit Number 11    Number of Visits 27    Date for PT Re-Evaluation 07/21/21    Authorization Type Medicaid [employer]    PT Start Time [phone]   pt late   PT Stop Time 1027    PT Time Calculation (min) 45 min    Activity Tolerance Patient tolerated treatment well;Patient limited by pain    Behavior During Therapy [facility] for tasks assessed/performed             Past Medical History:  Diagnosis Date   Anemia    Diabetes mellitus without complication ([place])    Hypertension     Past Surgical History:  Procedure Laterality Date   BREAST BIOPSY     left breast with maker placed   CESAREAN SECTION     DENTAL SURGERY     ORIF ELBOW FRACTURE Left 05/11/2021   Procedure: OPEN REDUCTION INTERNAL FIXATION DISTAL HUMERUS FRACTURE;  Surgeon: Hiram Gash, MD;  Location: [place];  Service: Orthopedics;  Laterality: Left;   SHOULDER ARTHROSCOPY WITH DISTAL CLAVICLE RESECTION Left 06/28/2021   Procedure: SHOULDER ARTHROSCOPY WITH DISTAL CLAVICLE RESECTION;  Surgeon: Hiram Gash, MD;  Location: [place];  Service: Orthopedics;  Laterality: Left;   SHOULDER ARTHROSCOPY WITH SUBACROMIAL DECOMPRESSION AND BICEP TENDON REPAIR Left 06/28/2021   Procedure: SHOULDER ARTHROSCOPY WITH SUBACROMIAL DECOMPRESSION; PARTIAL ACROMIOPLASTY WITH CORACOACROMIAL RELEASE AND  BICEP TENDODESIS;  Surgeon: Hiram Gash, MD;  Location: [place];  Service: Orthopedics;  Laterality: Left;   ULNAR NERVE TRANSPOSITION Left 05/11/2021   Procedure: RADIAL  NERVE DECOMPRESSION;  Surgeon: Hiram Gash, MD;  Location:  [place];  Service: Orthopedics;  Laterality: Left;    There were no vitals filed for this visit.   Subjective Assessment - 07/17/21 0943     Subjective Having some pain but doing okay. HEP going well- no issues/questions. Steristrips have all fallen off. Had some pain over the weekend d/t doing her son's hair.    Pertinent History DMII, previous L shoulder accident    Diagnostic tests MRI 06/01/20:  Mild tendinosis with tenosynovitis of biceps, mild AC joint OA    Patient Stated Goals To be able to move my hand back to normal again.    Currently in Pain? Yes    Pain Score 4     Pain Location Shoulder    Pain Orientation Left    Pain Descriptors / Indicators Sharp    Pain Type Acute pain;Surgical pain                               [facility] Adult PT Treatment/Exercise - 07/17/21 0001       Elbow Exercises   Elbow Flexion AROM;Left;Supine   wrist elevated on dynadisc and towel roll; triceps isometric 5x3" 2 sets     Shoulder Exercises: Supine   Protraction AROM;Both;10 reps    Protraction Limitations supine serratus punch   cues to maintain elbow straight   Other Supine Exercises --      Shoulder Exercises:  Seated   External Rotation AROM;Both;10 reps    External Rotation Limitations 2x10; shoulder in neutral; ROM to tolerance + scap retraction    Flexion AROM;Left;5 reps    Flexion Limitations 2x5 with thumb up   cues to avoid straining and shoulder hike, keeping elbow straight   Other Seated Exercises L shoulder slight scaption AROM x5   heavy shoulder hike     Shoulder Exercises: Pulleys   Flexion 3 minutes    Flexion Limitations to tolerance      Shoulder Exercises: ROM/Strengthening   UBE (Upper Arm Bike) L2.0 x2 min each way with L UE PROM/AAROM      Vasopneumatic   Number Minutes Vasopneumatic  10 minutes    Vasopnuematic Location  Shoulder   L   Vasopneumatic Pressure Low    Vasopneumatic Temperature  34                       PT Short Term Goals - 07/13/21 1037       PT SHORT TERM GOAL #1   Title Pt will be independent with initial HEP.    Status Achieved      PT SHORT TERM GOAL #2   Title Pt will verbalize techinques to address swelling and inflammation (PRICE).    Time 3    Period Weeks    Status Achieved    Target Date 07/20/21      PT SHORT TERM GOAL #3   Title Pt will be able to sleep through the night with appropriate props for shoulder.    Baseline slept sitting up last night    Time 3    Period Weeks    Status Partially Met   past few nights were rough but wasnt too bad before then   Target Date 07/20/21               PT Long Term Goals - 06/29/21 1402       PT LONG TERM GOAL #1   Title Pt will be independent with advanced HEP.    Status On-going      PT LONG TERM GOAL #2   Title Patient will increase LUE AROM to [facility] to allow her to perform functional activities around home.    Status On-going      PT LONG TERM GOAL #3   Title Patient will increase LUE strength to at least 4+/5 to allow her to perform tasks in her home.    Baseline NT    Status On-going      PT LONG TERM GOAL #4   Title Patient will increase L grip strength to within 10 pounds ot R grip strength to allow her to open containers.    Status On-going                   Plan - 07/17/21 1019     Clinical Impression Statement Patient arrived to session with report of moderate shoulder pain after doing her son's hair over the weekend. Notes that her steri-strips have all come off; observed a well-healing incision without redness, warmth, drainage today. Worked on gentle L shoulder PROM/AAROM on pulley and UBE within patient's tolerance and while following protocol limits. L elbow AROM still appears slightly limited with these activities. Progressed shoulder AROM activities in sitting with manual and verbal cueing to avoid shoulder hike. Initiated gentle scapular protraction for improved  scapular upward rotation, which patient performed with improved form after practice. L elbow  still appeared slightly edematous and continues to demonstrate limited extension ROM. Limited tolerance for these activities today. Ended session with [employer] to L shoulder today per patient's request. No complaints at end of session. Patient is progressing well despite remaining pain.    Personal Factors and Comorbidities Fitness    Examination-Activity Limitations Reach Overhead;Dressing;Hygiene/Grooming;Carry;Sleep;Lift    Examination-Participation Restrictions Occupation;Driving;Meal Prep;Laundry;Cleaning    Stability/Clinical Decision Making Stable/Uncomplicated    Rehab Potential Good    PT Frequency 2x / week    PT Duration 8 weeks    PT Treatment/Interventions ADLs/Self Care Home Management;Cryotherapy;Electrical Stimulation;Iontophoresis 75m/ml Dexamethasone;Moist Heat;Ultrasound;Functional mobility training;Therapeutic activities;Therapeutic exercise;Neuromuscular re-education;Patient/family education;Manual techniques;Scar mobilization;Passive range of motion;Dry needling;Taping;Joint Manipulations;Vasopneumatic Device;Spinal Manipulations    PT Next Visit Plan Reassess update to HEP, avoid abduction and cross body adduction for 6 weeks (per protocol), follow protocol under media, manual therapy and modalities as indicated, PROM, gentle periscapular strengthening    Consulted and Agree with Plan of Care Patient             Patient will benefit from skilled therapeutic intervention in order to improve the following deficits and impairments:  Decreased range of motion, Increased muscle spasms, Impaired UE functional use, Decreased activity tolerance, Pain, Decreased strength, Postural dysfunction, Increased fascial restricitons, Impaired perceived functional ability  Visit Diagnosis: Stiffness of left shoulder, not elsewhere classified  Stiffness of left elbow, not elsewhere  classified  Acute pain of left shoulder  Muscle weakness (generalized)  Abnormal posture  Cramp and spasm     Problem List Patient Active Problem List   Diagnosis Date Noted   History of miscarriage 08/19/2015   Elevated WBC count 05/18/2015   Pneumococcal vaccination declined 03/15/2015   Diabetes mellitus, type 2 ([place] 12/23/2014   IMPAIRED GLUCOSE TOLERANCE TEST 10/13/2010     YJanene Harvey PT, DPT 07/17/21 10:30 AM    [place] [address]Phone: [phone]  Fax:  [phone] Name: Lynn MEDINEMRN: [id number] of Birth: 803-29-1972

## 2021-07-20 ENCOUNTER — Ambulatory Visit: Payer: Medicaid Other | Admitting: Physical Therapy

## 2021-07-20 ENCOUNTER — Encounter: Payer: Self-pay | Admitting: Physical Therapy

## 2021-07-20 ENCOUNTER — Other Ambulatory Visit: Payer: Self-pay

## 2021-07-20 DIAGNOSIS — M6281 Muscle weakness (generalized): Secondary | ICD-10-CM

## 2021-07-20 DIAGNOSIS — M25622 Stiffness of left elbow, not elsewhere classified: Secondary | ICD-10-CM

## 2021-07-20 DIAGNOSIS — M25512 Pain in left shoulder: Secondary | ICD-10-CM

## 2021-07-20 DIAGNOSIS — R293 Abnormal posture: Secondary | ICD-10-CM

## 2021-07-20 DIAGNOSIS — M25612 Stiffness of left shoulder, not elsewhere classified: Secondary | ICD-10-CM

## 2021-07-20 DIAGNOSIS — R252 Cramp and spasm: Secondary | ICD-10-CM

## 2021-07-20 NOTE — Therapy (Signed)
[facility]. [place], [place], [address] Phone: [phone]   Fax:  [phone]  Physical Therapy Treatment  Patient Details  Name: Lynn Moody MRN: [id number] Date of Birth: 09/24/1971 Referring Provider (PT): Ophelia Charter, MD   Encounter Date: 07/20/2021   PT End of Session - 07/20/21 1043     Visit Number 12    Number of Visits 24    Date for PT Re-Evaluation 09/14/21    Authorization Type Medicaid [place] - Visit Number 12    Authorization - Number of Visits 70    PT Start Time [phone]   pt late   PT Stop Time 1022    PT Time Calculation (min) 39 min    Activity Tolerance Patient tolerated treatment well;Patient limited by pain    Behavior During Therapy [facility] for tasks assessed/performed             Past Medical History:  Diagnosis Date   Anemia    Diabetes mellitus without complication ([place])    Hypertension     Past Surgical History:  Procedure Laterality Date   BREAST BIOPSY     left breast with maker placed   CESAREAN SECTION     DENTAL SURGERY     ORIF ELBOW FRACTURE Left 05/11/2021   Procedure: OPEN REDUCTION INTERNAL FIXATION DISTAL HUMERUS FRACTURE;  Surgeon: Hiram Gash, MD;  Location: [place];  Service: Orthopedics;  Laterality: Left;   SHOULDER ARTHROSCOPY WITH DISTAL CLAVICLE RESECTION Left 06/28/2021   Procedure: SHOULDER ARTHROSCOPY WITH DISTAL CLAVICLE RESECTION;  Surgeon: Hiram Gash, MD;  Location: [place];  Service: Orthopedics;  Laterality: Left;   SHOULDER ARTHROSCOPY WITH SUBACROMIAL DECOMPRESSION AND BICEP TENDON REPAIR Left 06/28/2021   Procedure: SHOULDER ARTHROSCOPY WITH SUBACROMIAL DECOMPRESSION; PARTIAL ACROMIOPLASTY WITH CORACOACROMIAL RELEASE AND  BICEP TENDODESIS;  Surgeon: Hiram Gash, MD;  Location: [place];  Service: Orthopedics;  Laterality: Left;   ULNAR NERVE TRANSPOSITION Left 05/11/2021    Procedure: RADIAL  NERVE DECOMPRESSION;  Surgeon: Hiram Gash, MD;  Location: [place];  Service: Orthopedics;  Laterality: Left;    There were no vitals filed for this visit.   Subjective Assessment - 07/20/21 0944     Subjective Took a walk yesterday for the first time- had some soreness in the AM today. Better with medicine. Reports 65% improvement in L elbow, 70% in shoulder. Still notes difficulty with lifting, especially lifting to touch her head, driving.    Pertinent History DMII, previous L shoulder accident    Diagnostic tests MRI 06/01/20:  Mild tendinosis with tenosynovitis of biceps, mild AC joint OA    Patient Stated Goals To be able to move my hand back to normal again.    Currently in Pain? Yes    Pain Score 3     Pain Location Shoulder    Pain Orientation Left    Pain Descriptors / Indicators Sore    Pain Type Acute pain;Surgical pain                [facility] PT Assessment - 07/20/21 0948       Assessment   Medical Diagnosis LEFT SHOULDER PRIMARY OSTEOARTHRITIS; IMPINGEMENT SYNDROME; BICIPITAL TENDINITIS    Referring Provider (PT) Ophelia Charter, MD    Onset Date/Surgical Date 06/28/21      AROM   Left Shoulder Flexion 105 Degrees    Left Elbow Flexion  132    Left Elbow Extension 22    Left Forearm Pronation 85 Degrees    Left Forearm Supination 88 Degrees      PROM   Left Shoulder Flexion 132 Degrees    Left Shoulder Internal Rotation 54 Degrees    Left Shoulder External Rotation 26 Degrees      Strength   Left Hand Grip (lbs) 19   20, 19, 18                          [facility] Adult PT Treatment/Exercise - 07/20/21 0001       Shoulder Exercises: Seated   External Rotation AAROM;Right    External Rotation Limitations with wand; cues to maintain shoulder in neutral    Other Seated Exercises R shoulder ER stretch in doorway 10x5"   shoulder in neutral     Vasopneumatic   Number Minutes Vasopneumatic  10 minutes     Vasopnuematic Location  Shoulder   L   Vasopneumatic Pressure Low    Vasopneumatic Temperature  34      Manual Therapy   Manual Therapy Passive ROM    Manual therapy comments supine    Passive ROM L shoulder PROM flexion, IR/ER pain-free; L elbow flexion, extension, pronation/supination                    PT Education - 07/20/21 1043     Education Details discussion on objective progress and remaining impairments; review of HEP and most beneficial ther-ex    Person(s) Educated Patient    Methods Explanation;Demonstration;Tactile cues;Verbal cues;Handout    Comprehension Verbalized understanding;Returned demonstration              PT Short Term Goals - 07/20/21 1105       PT SHORT TERM GOAL #1   Title Pt will be independent with initial HEP.    Status Achieved      PT SHORT TERM GOAL #2   Title Pt will verbalize techinques to address swelling and inflammation (PRICE).    Time 3    Period Weeks    Status Achieved    Target Date 07/20/21      PT SHORT TERM GOAL #3   Title Pt will be able to sleep through the night with appropriate props for shoulder.    Baseline slept sitting up last night    Time 3    Period Weeks    Status Achieved    Target Date 07/20/21               PT Long Term Goals - 07/20/21 1105       PT LONG TERM GOAL #1   Title Pt will be independent with advanced HEP.    Time 8    Status Partially Met   met for current   Target Date 09/14/21      PT LONG TERM GOAL #2   Title Patient will increase LUE AROM to [facility] to allow her to perform functional activities around home.    Status On-going   still limited in shoulder AROM and elbow extension AROM   Target Date 09/14/21      PT LONG TERM GOAL #3   Title Patient will increase LUE strength to at least 4+/5 to allow her to perform tasks in her home.    Baseline NT    Time 8    Status On-going   limited by post-op precautions  Target Date 09/14/21      PT LONG TERM GOAL #4    Title Patient will increase L grip strength to within 10 pounds ot R grip strength to allow her to open containers.    Status On-going   still limited   Target Date 09/14/21                   Plan - 07/20/21 1104     Clinical Impression Statement Patient arrived to session with report of 65% improvement in the L elbow and 70% in the L shoulder since her surgeries. Still notes difficulty with lifting, especially lifting to touch her head and driving. Reports understanding and utilization of PRICE. Notes that she is able to sleep through the night using pillows to prop the L UE, however wakes up in pain. L elbow AROM has improved in flexion, however showing a decline in extension. L shoulder PROM has improved in all planes but most limited in ER; AROM improved in flexion. Strength testing limited d/t post-op precautions, however L grip strength has declined slightly. Encouraged patient to continue moving within post-op precautions to avoid scar tissue formation and improve mobility, since PROM today was limited by pain. Reviewed HEP and encouraged patient to continue focusing on grip strength, elbow extension, and shoulder ER HEP- patient demonstrated and reported good understanding. Ended session with [employer] to L shoulder per patient's request. No complaints at end of session. Patient is demonstrating slow but steady progress towards goals. Would benefit from additional skilled PT services 2x/week for 4 weeks followed by 1x/week for 4 weeks to address remaining goals.    Personal Factors and Comorbidities Fitness    Examination-Activity Limitations Reach Overhead;Dressing;Hygiene/Grooming;Carry;Sleep;Lift    Examination-Participation Restrictions Occupation;Driving;Meal Prep;Laundry;Cleaning    Stability/Clinical Decision Making Stable/Uncomplicated    Rehab Potential Good    PT Frequency Other (comment)   services 2x/week for 4 weeks followed by 1x/week for 4 weeks   PT  Treatment/Interventions ADLs/Self Care Home Management;Cryotherapy;Electrical Stimulation;Iontophoresis 14m/ml Dexamethasone;Moist Heat;Ultrasound;Functional mobility training;Therapeutic activities;Therapeutic exercise;Neuromuscular re-education;Patient/family education;Manual techniques;Scar mobilization;Passive range of motion;Dry needling;Taping;Joint Manipulations;Vasopneumatic Device;Spinal Manipulations    PT Next Visit Plan avoid abduction and cross body adduction for 6 weeks (per protocol), follow protocol under media, manual therapy and modalities as indicated, PROM, gentle periscapular strengthening    Consulted and Agree with Plan of Care Patient             Patient will benefit from skilled therapeutic intervention in order to improve the following deficits and impairments:  Decreased range of motion, Increased muscle spasms, Impaired UE functional use, Decreased activity tolerance, Pain, Decreased strength, Postural dysfunction, Increased fascial restricitons, Impaired perceived functional ability  Visit Diagnosis: Stiffness of left shoulder, not elsewhere classified  Stiffness of left elbow, not elsewhere classified  Acute pain of left shoulder  Muscle weakness (generalized)  Abnormal posture  Cramp and spasm     Problem List Patient Active Problem List   Diagnosis Date Noted   History of miscarriage 08/19/2015   Elevated WBC count 05/18/2015   Pneumococcal vaccination declined 03/15/2015   Di[place] 2 ([place] 12/23/2014   IMPAIRED GLUCOSE TOLERANCE TEST 10/13/2010     YJanene Harvey PT, DPT 07/20/21 11:12 AM    [place] [address]Phone: [phone]  Fax:  [phone] Name: Lynn FLAMENCOMRN: [id number] of Birth: 804-May-1972

## 2021-07-24 ENCOUNTER — Other Ambulatory Visit: Payer: Self-pay

## 2021-07-24 ENCOUNTER — Encounter: Payer: Self-pay | Admitting: Rehabilitative and Restorative Service Providers"

## 2021-07-24 ENCOUNTER — Ambulatory Visit: Payer: Medicaid Other | Admitting: Rehabilitative and Restorative Service Providers"

## 2021-07-24 DIAGNOSIS — R252 Cramp and spasm: Secondary | ICD-10-CM

## 2021-07-24 DIAGNOSIS — M25512 Pain in left shoulder: Secondary | ICD-10-CM

## 2021-07-24 DIAGNOSIS — M6281 Muscle weakness (generalized): Secondary | ICD-10-CM

## 2021-07-24 DIAGNOSIS — M25612 Stiffness of left shoulder, not elsewhere classified: Secondary | ICD-10-CM | POA: Diagnosis not present

## 2021-07-24 DIAGNOSIS — M25622 Stiffness of left elbow, not elsewhere classified: Secondary | ICD-10-CM

## 2021-07-24 NOTE — Therapy (Signed)
[place]. [place], [place], [address] Phone: [phone]   Fax:  [phone]  Physical Therapy Treatment  Patient Details  Name: Lynn Moody MRN: [id number] Date of Birth: 11/02/1971 Referring Provider (PT): Ophelia Charter, MD   Encounter Date: 07/24/2021   PT End of Session - 07/24/21 1023     Visit Number 13    Number of Visits 24    Date for PT Re-Evaluation 09/14/21    Authorization Type Medicaid [place] - Number of Visits 27    PT Start Time 0930    PT Stop Time 1028    PT Time Calculation (min) 58 min    Activity Tolerance Patient tolerated treatment well    Behavior During Therapy WFL for tasks assessed/performed             Past Medical History:  Diagnosis Date   Anemia    Diabetes mellitus without complication ([place])    Hypertension     Past Surgical History:  Procedure Laterality Date   BREAST BIOPSY     left breast with maker placed   CESAREAN SECTION     DENTAL SURGERY     ORIF ELBOW FRACTURE Left 05/11/2021   Procedure: OPEN REDUCTION INTERNAL FIXATION DISTAL HUMERUS FRACTURE;  Surgeon: Hiram Gash, MD;  Location: [facility];  Service: Orthopedics;  Laterality: Left;   SHOULDER ARTHROSCOPY WITH DISTAL CLAVICLE RESECTION Left 06/28/2021   Procedure: SHOULDER ARTHROSCOPY WITH DISTAL CLAVICLE RESECTION;  Surgeon: Hiram Gash, MD;  Location: [place];  Service: Orthopedics;  Laterality: Left;   SHOULDER ARTHROSCOPY WITH SUBACROMIAL DECOMPRESSION AND BICEP TENDON REPAIR Left 06/28/2021   Procedure: SHOULDER ARTHROSCOPY WITH SUBACROMIAL DECOMPRESSION; PARTIAL ACROMIOPLASTY WITH CORACOACROMIAL RELEASE AND  BICEP TENDODESIS;  Surgeon: Hiram Gash, MD;  Location: [facility];  Service: Orthopedics;  Laterality: Left;   ULNAR NERVE TRANSPOSITION Left 05/11/2021   Procedure: RADIAL  NERVE DECOMPRESSION;  Surgeon: Hiram Gash, MD;   Location: [facility];  Service: Orthopedics;  Laterality: Left;    There were no vitals filed for this visit.   Subjective Assessment - 07/24/21 1020     Subjective I go see Dr Griffin Basil on Wednesday.    Patient Stated Goals To be able to move my hand back to normal again.    Currently in Pain? Yes    Pain Score 3     Pain Location Shoulder    Pain Orientation Left    Pain Descriptors / Indicators Sore;Sharp    Pain Type Acute pain;Surgical pain    Pain Score 3    Pain Location Elbow    Pain Orientation Left                [facility] PT Assessment - 07/24/21 0001       Assessment   Medical Diagnosis s/p L distal clavicle resection and SHOULDER ARTHROSCOPY WITH SUBACROMIAL DECOMPRESSION; PARTIAL ACROMIOPLASTY WITH CORACOACROMIAL RELEASE AND  BICEP TENDODESIS (Left)    Referring Provider (PT) Ophelia Charter, MD    Onset Date/Surgical Date 06/28/21    Hand Dominance Right    Next MD Visit 07/26/2021      AROM   Left Shoulder Flexion 124 Degrees   in supine   Left Shoulder ABduction --    Left Elbow Flexion 132    Left Elbow Extension 15      PROM   Left Shoulder Flexion 140  Degrees    Left Shoulder ABduction --                           [facility] Adult PT Treatment/Exercise - 07/24/21 0001       Shoulder Exercises: Seated   External Rotation AAROM;Left;20 reps    External Rotation Limitations with wand; cues to maintain shoulder in neutral    Flexion AAROM;Left;20 reps    Flexion Limitations with cane    Abduction --    ABduction Limitations --      Shoulder Exercises: Standing   Other Standing Exercises Finger ladder flex and scaption x10 each      Shoulder Exercises: ROM/Strengthening   UBE (Upper Arm Bike) L2.0 x3 min each way with L UE PROM/AAROM    Wall Wash flex and scaption x10 each      Vasopneumatic   Number Minutes Vasopneumatic  10 minutes    Vasopnuematic Location  Shoulder    Vasopneumatic Pressure Low    Vasopneumatic  Temperature  34      Manual Therapy   Manual Therapy Soft tissue mobilization;Passive ROM    Manual therapy comments supine    Soft tissue mobilization to distal bicep region    Passive ROM L shoulder PROM flexion, IR/ER pain-free; L elbow flexion, extension, pronation/supination                      PT Short Term Goals - 07/20/21 1105       PT SHORT TERM GOAL #1   Title Pt will be independent with initial HEP.    Status Achieved      PT SHORT TERM GOAL #2   Title Pt will verbalize techinques to address swelling and inflammation (PRICE).    Time 3    Period Weeks    Status Achieved    Target Date 07/20/21      PT SHORT TERM GOAL #3   Title Pt will be able to sleep through the night with appropriate props for shoulder.    Baseline slept sitting up last night    Time 3    Period Weeks    Status Achieved    Target Date 07/20/21               PT Long Term Goals - 07/24/21 1044       PT LONG TERM GOAL #1   Title Pt will be independent with advanced HEP.    Status Partially Met      PT LONG TERM GOAL #2   Title Patient will increase LUE AROM to [facility] to allow her to perform functional activities around home.    Status On-going      PT LONG TERM GOAL #3   Title Patient will increase LUE strength to at least 4+/5 to allow her to perform tasks in her home.    Status On-going      PT LONG TERM GOAL #4   Title Patient will increase L grip strength to within 10 pounds ot R grip strength to allow her to open containers.    Status On-going                   Plan - 07/24/21 1026     Clinical Impression Statement Ms Hoefling continues to tolerate ther ex well.  She has made improvements in her AROM/PROM since last visit.  She is primarily limited in PROM with an empty end  feel of pain.  She is compliant with HEP.  With soft tissue mobilization to distal biceps region, was able to achieve L elbow extension to 0 degrees.  She continues to progress and  continues to require continues dkilled PT to progress towards goal related activities.    PT Treatment/Interventions ADLs/Self Care Home Management;Cryotherapy;Electrical Stimulation;Iontophoresis 9m/ml Dexamethasone;Moist Heat;Ultrasound;Functional mobility training;Therapeutic activities;Therapeutic exercise;Neuromuscular re-education;Patient/family education;Manual techniques;Scar mobilization;Passive range of motion;Dry needling;Taping;Joint Manipulations;Vasopneumatic Device;Spinal Manipulations    PT Next Visit Plan avoid abduction and cross body adduction for 6 weeks (per protocol), follow protocol under media, manual therapy and modalities as indicated, PROM, gentle periscapular strengthening    Consulted and Agree with Plan of Care Patient             Patient will benefit from skilled therapeutic intervention in order to improve the following deficits and impairments:  Decreased range of motion, Increased muscle spasms, Impaired UE functional use, Decreased activity tolerance, Pain, Decreased strength, Postural dysfunction, Increased fascial restricitons, Impaired perceived functional ability  Visit Diagnosis: Stiffness of left shoulder, not elsewhere classified  Stiffness of left elbow, not elsewhere classified  Acute pain of left shoulder  Muscle weakness (generalized)  Cramp and spasm     Problem List Patient Active Problem List   Diagnosis Date Noted   History of miscarriage 08/19/2015   Elevated WBC count 05/18/2015   Pneumococcal vaccination declined 03/15/2015   Di[place] 2 ([place] 12/23/2014   IMPAIRED GLUCOSE TOLERANCE TEST 10/13/2010    SJuel Burrow PT, DPT 07/24/2021, 10:47 AM  [place] [address]Phone: [phone]  Fax:  [phone] Name: Lynn RAISCHMRN: [id number] of Birth: 8Aug 14, 1972

## 2021-07-27 ENCOUNTER — Ambulatory Visit: Payer: Medicaid Other | Attending: Orthopaedic Surgery | Admitting: Physical Therapy

## 2021-07-27 ENCOUNTER — Other Ambulatory Visit: Payer: Self-pay

## 2021-07-27 ENCOUNTER — Ambulatory Visit: Payer: Medicaid Other

## 2021-07-27 ENCOUNTER — Encounter: Payer: Self-pay | Admitting: Physical Therapy

## 2021-07-27 DIAGNOSIS — R252 Cramp and spasm: Secondary | ICD-10-CM | POA: Insufficient documentation

## 2021-07-27 DIAGNOSIS — M25622 Stiffness of left elbow, not elsewhere classified: Secondary | ICD-10-CM | POA: Diagnosis present

## 2021-07-27 DIAGNOSIS — R293 Abnormal posture: Secondary | ICD-10-CM | POA: Insufficient documentation

## 2021-07-27 DIAGNOSIS — M25612 Stiffness of left shoulder, not elsewhere classified: Secondary | ICD-10-CM | POA: Diagnosis present

## 2021-07-27 DIAGNOSIS — M6281 Muscle weakness (generalized): Secondary | ICD-10-CM | POA: Diagnosis not present

## 2021-07-27 DIAGNOSIS — M25512 Pain in left shoulder: Secondary | ICD-10-CM | POA: Diagnosis present

## 2021-07-27 NOTE — Therapy (Signed)
[place]. [place], [place], [address] Phone: [phone]   Fax:  [phone]  Physical Therapy Treatment  Patient Details  Name: Lynn Moody MRN: [id number] Date of Birth: [birth year] Referring Provider (PT): Ophelia Charter, MD   Encounter Date: 07/27/2021   PT End of Session - 07/27/21 0926     Visit Number 14    Number of Visits 24    Date for PT Re-Evaluation 09/14/21    Authorization Type Medicaid [place] Time Period 12 visits by 09/14/21 then transition to HEP    Authorization - Visit Number 1    Authorization - Number of Visits 12    PT Start Time [phone]    PT Stop Time 0935    PT Time Calculation (min) 51 min    Activity Tolerance Patient tolerated treatment well;Patient limited by pain    Behavior During Therapy [facility] for tasks assessed/performed             Past Medical History:  Diagnosis Date   Anemia    Diabetes mellitus without complication ([place])    Hypertension     Past Surgical History:  Procedure Laterality Date   BREAST BIOPSY     left breast with maker placed   CESAREAN SECTION     DENTAL SURGERY     ORIF ELBOW FRACTURE Left 05/11/2021   Procedure: OPEN REDUCTION INTERNAL FIXATION DISTAL HUMERUS FRACTURE;  Surgeon: Hiram Gash, MD;  Location: [place];  Service: Orthopedics;  Laterality: Left;   SHOULDER ARTHROSCOPY WITH DISTAL CLAVICLE RESECTION Left 06/28/2021   Procedure: SHOULDER ARTHROSCOPY WITH DISTAL CLAVICLE RESECTION;  Surgeon: Hiram Gash, MD;  Location: [place];  Service: Orthopedics;  Laterality: Left;   SHOULDER ARTHROSCOPY WITH SUBACROMIAL DECOMPRESSION AND BICEP TENDON REPAIR Left 06/28/2021   Procedure: SHOULDER ARTHROSCOPY WITH SUBACROMIAL DECOMPRESSION; PARTIAL ACROMIOPLASTY WITH CORACOACROMIAL RELEASE AND  BICEP TENDODESIS;  Surgeon: Hiram Gash, MD;  Location: [facility];  Service: Orthopedics;   Laterality: Left;   ULNAR NERVE TRANSPOSITION Left 05/11/2021   Procedure: RADIAL  NERVE DECOMPRESSION;  Surgeon: Hiram Gash, MD;  Location: [place];  Service: Orthopedics;  Laterality: Left;    There were no vitals filed for this visit.   Subjective Assessment - 07/27/21 0845     Subjective Has been having more pain in the L shoulder since Monday. Jerked her arm trying to avoid falling on Monday and the new exercise from [facility] session have contributed to her increaed pain. Worse when trying to reach overhead.    Pertinent History DMII, previous L shoulder accident    Diagnostic tests MRI 06/01/20:  Mild tendinosis with tenosynovitis of biceps, mild AC joint OA    Patient Stated Goals To be able to move my hand back to normal again.    Currently in Pain? Yes    Pain Score 4     Pain Location Shoulder    Pain Orientation Left    Pain Descriptors / Indicators Sore;Sharp    Pain Type Acute pain;Surgical pain                               [facility] Adult PT Treatment/Exercise - 07/27/21 0001       Shoulder Exercises: Supine   External Rotation AAROM;10 reps;Left    External Rotation Limitations with dowel    Flexion  AAROM;Left;10 reps    Flexion Limitations with dowel      Shoulder Exercises: Seated   Flexion AAROM;Left;5 reps    Flexion Limitations with dowel   within limited ROM, focusing on depressing L shoulder     Shoulder Exercises: Standing   Row Strengthening;Both;10 reps;Theraband    Theraband Level (Shoulder Row) Level 1 (Yellow)    Row Limitations 2x10   stopping at neutral and cueing to depress L shoulder   Other Standing Exercises triceps extension with yellow TB 2x10   cues to decrease eccentric speed     Shoulder Exercises: Pulleys   Flexion 3 minutes    Flexion Limitations to tolerance      Shoulder Exercises: ROM/Strengthening   UBE (Upper Arm Bike) L2.0 x3 min each way with L UE AAROM      Vasopneumatic   Number  Minutes Vasopneumatic  10 minutes    Vasopnuematic Location  Shoulder   L   Vasopneumatic Pressure Low    Vasopneumatic Temperature  34      Manual Therapy   Manual Therapy Soft tissue mobilization;Passive ROM;Myofascial release    Manual therapy comments sitting    Soft tissue mobilization STM to L proximal biceps, pec, anterior and lateral delt    Myofascial Release manual TPR to L proximal biceps, pec, anterior and lateral delt   c/o TTP and soft tissue restriction which improved                     PT Short Term Goals - 07/20/21 1105       PT SHORT TERM GOAL #1   Title Pt will be independent with initial HEP.    Status Achieved      PT SHORT TERM GOAL #2   Title Pt will verbalize techinques to address swelling and inflammation (PRICE).    Time 3    Period Weeks    Status Achieved    Target Date 07/20/21      PT SHORT TERM GOAL #3   Title Pt will be able to sleep through the night with appropriate props for shoulder.    Baseline slept sitting up last night    Time 3    Period Weeks    Status Achieved    Target Date 07/20/21               PT Long Term Goals - 07/24/21 1044       PT LONG TERM GOAL #1   Title Pt will be independent with advanced HEP.    Status Partially Met      PT LONG TERM GOAL #2   Title Patient will increase LUE AROM to [facility] to allow her to perform functional activities around home.    Status On-going      PT LONG TERM GOAL #3   Title Patient will increase LUE strength to at least 4+/5 to allow her to perform tasks in her home.    Status On-going      PT LONG TERM GOAL #4   Title Patient will increase L grip strength to within 10 pounds ot R grip strength to allow her to open containers.    Status On-going                   Plan - 07/27/21 0929     Clinical Impression Statement Patient arrived to session with report of increased L shoulder pain since Monday after doing new exercises in PT and after  jerking her  shoulder when trying to avoid falling. Notes increased pain with reaching overhead. Patient tolerated MT to address soft tissue restriction and pain, with increased tightness evident in the L biceps, pec, and deltoid. Soft tissue restriction did seem improved after MT. Patient performed more gentle AAROM exercises d/t c/o increased pain today. Patient demonstrated slightly more limited ROM in flexion today. Seated AAROM focused on depressing L shoulder and moving within a pain-free range. L elbow still appears edematous, thus encouraged patient to start icing and elevating to improve this- patient reported understanding. Ended session with [employer] to L shoulder to address pain at end of session. No further complaints upon leaving.    PT Treatment/Interventions ADLs/Self Care Home Management;Cryotherapy;Electrical Stimulation;Iontophoresis 60m/ml Dexamethasone;Moist Heat;Ultrasound;Functional mobility training;Therapeutic activities;Therapeutic exercise;Neuromuscular re-education;Patient/family education;Manual techniques;Scar mobilization;Passive range of motion;Dry needling;Taping;Joint Manipulations;Vasopneumatic Device;Spinal Manipulations    PT Next Visit Plan avoid abduction and cross body adduction for 6 weeks (per protocol), follow protocol under media, manual therapy and modalities as indicated, PROM, gentle periscapular strengthening    Consulted and Agree with Plan of Care Patient             Patient will benefit from skilled therapeutic intervention in order to improve the following deficits and impairments:  Decreased range of motion, Increased muscle spasms, Impaired UE functional use, Decreased activity tolerance, Pain, Decreased strength, Postural dysfunction, Increased fascial restricitons, Impaired perceived functional ability  Visit Diagnosis: Stiffness of left shoulder, not elsewhere classified  Stiffness of left elbow, not elsewhere classified  Acute pain of left  shoulder  Muscle weakness (generalized)  Cramp and spasm  Abnormal posture     Problem List Patient Active Problem List   Diagnosis Date Noted   History of miscarriage 08/19/2015   Elevated WBC count 05/18/2015   Pneumococcal vaccination declined 03/15/2015   Di[place] type 2 ([place] 12/23/2014   IMPAIRED GLUCOSE TOLERANCE TEST 10/13/2010     YJanene Harvey PT, DPT 07/27/21 12:03 PM    [place] [address]Phone: [phone]  Fax:  [phone] Name: NCOLBY CATANESEMRN: [id number] of Birth: [birth year]

## 2021-08-01 ENCOUNTER — Other Ambulatory Visit: Payer: Self-pay

## 2021-08-01 ENCOUNTER — Ambulatory Visit: Payer: Medicaid Other

## 2021-08-01 DIAGNOSIS — R252 Cramp and spasm: Secondary | ICD-10-CM

## 2021-08-01 DIAGNOSIS — M25622 Stiffness of left elbow, not elsewhere classified: Secondary | ICD-10-CM

## 2021-08-01 DIAGNOSIS — M25612 Stiffness of left shoulder, not elsewhere classified: Secondary | ICD-10-CM

## 2021-08-01 DIAGNOSIS — M25512 Pain in left shoulder: Secondary | ICD-10-CM

## 2021-08-01 DIAGNOSIS — R293 Abnormal posture: Secondary | ICD-10-CM

## 2021-08-01 DIAGNOSIS — M6281 Muscle weakness (generalized): Secondary | ICD-10-CM

## 2021-08-01 NOTE — Patient Instructions (Signed)
Access Code: [email or web address] URL: [email or web address] Date: 08/01/2021 Prepared by: Sherlynn Stalls  Exercises Flexion-Extension Shoulder Pendulum with Table Support - 2 x daily - 7 x weekly - 60 seconds hold Supine Shoulder Flexion Extension AAROM with Dowel - 2 x daily - 7 x weekly - 2 sets - 10 reps Supine Shoulder External Rotation AAROM with Dowel - 2 x daily - 7 x weekly - 2 sets - 10 reps Supine Shoulder Flexion Extension Full Range AROM - 2 x daily - 7 x weekly - 2-3 sets - 5 reps Standing Elbow Flexion Extension AROM - 1 x daily - 7 x weekly - 2 sets - 10 reps Seated Shoulder Shrugs - 2 x daily - 7 x weekly - 2 sets - 10 reps Seated Scapular Retraction - 2 x daily - 7 x weekly - 2 sets - 10 reps - 5 second holds hold Wrist Flexion Extension AROM - [place] - 1 x daily - 7 x weekly - 1 sets - 10 reps Isometric Shoulder Extension at Wall - 1 x daily - 7 x weekly - 5 reps - 5 second hold Isometric Shoulder Flexion at Wall - 1 x daily - 7 x weekly - 5 reps - 5 second hold

## 2021-08-01 NOTE — Therapy (Signed)
[place]. [place], [place], [address] Phone: [phone]   Fax:  [phone]  Physical Therapy Treatment  Patient Details  Name: Lynn Moody MRN: [id number] Date of Birth: 02/01/1971 Referring Provider (PT): Ophelia Charter, MD   Encounter Date: 08/01/2021   PT End of Session - 08/01/21 1309     Visit Number 15    Number of Visits 24    Date for PT Re-Evaluation 09/14/21    Authorization Type Medicaid [place] Time Period 12 visits by 09/14/21 then transition to HEP    Authorization - Visit Number 2    Authorization - Number of Visits 12    PT Start Time 1306    PT Stop Time 1345    PT Time Calculation (min) 39 min    Activity Tolerance Patient tolerated treatment well;Patient limited by pain    Behavior During Therapy [facility] for tasks assessed/performed             Past Medical History:  Diagnosis Date   Anemia    Diabetes mellitus without complication ([place])    Hypertension     Past Surgical History:  Procedure Laterality Date   BREAST BIOPSY     left breast with maker placed   CESAREAN SECTION     DENTAL SURGERY     ORIF ELBOW FRACTURE Left 05/11/2021   Procedure: OPEN REDUCTION INTERNAL FIXATION DISTAL HUMERUS FRACTURE;  Surgeon: Hiram Gash, MD;  Location: [place];  Service: Orthopedics;  Laterality: Left;   SHOULDER ARTHROSCOPY WITH DISTAL CLAVICLE RESECTION Left 06/28/2021   Procedure: SHOULDER ARTHROSCOPY WITH DISTAL CLAVICLE RESECTION;  Surgeon: Hiram Gash, MD;  Location: [place];  Service: Orthopedics;  Laterality: Left;   SHOULDER ARTHROSCOPY WITH SUBACROMIAL DECOMPRESSION AND BICEP TENDON REPAIR Left 06/28/2021   Procedure: SHOULDER ARTHROSCOPY WITH SUBACROMIAL DECOMPRESSION; PARTIAL ACROMIOPLASTY WITH CORACOACROMIAL RELEASE AND  BICEP TENDODESIS;  Surgeon: Hiram Gash, MD;  Location: [place];  Service: Orthopedics;   Laterality: Left;   ULNAR NERVE TRANSPOSITION Left 05/11/2021   Procedure: RADIAL  NERVE DECOMPRESSION;  Surgeon: Hiram Gash, MD;  Location: [facility];  Service: Orthopedics;  Laterality: Left;    There were no vitals filed for this visit.   Subjective Assessment - 08/01/21 1307     Subjective Felt okay after last PT session, did get steroid injection at Dr Wenda Low on friday which helped some with pain. Also took pain medicaiton before PT tpday    Pertinent History DMII, previous L shoulder accident    Diagnostic tests MRI 06/01/20:  Mild tendinosis with tenosynovitis of biceps, mild AC joint OA    Patient Stated Goals To be able to move my hand back to normal again.    Currently in Pain? Yes    Pain Score 4     Pain Location Shoulder    Pain Orientation Left                               [facility] Adult PT Treatment/Exercise - 08/01/21 0001       Shoulder Exercises: Supine   Flexion AAROM;Left;10 reps    Flexion Limitations with dowel      Shoulder Exercises: Seated   External Rotation AAROM;Left;20 reps    External Rotation Limitations with wand; cues to maintain shoulder in neutral    Flexion AAROM;Left;10 reps  Flexion Limitations with dowel   within limited ROM, focusing on depressing L shoulder     Shoulder Exercises: Standing   Row Strengthening;Both;10 reps;Theraband    Theraband Level (Shoulder Row) Level 1 (Yellow)    Row Limitations 2x10   stopping at neutral and cueing to depress L shoulder     Shoulder Exercises: Pulleys   Flexion 3 minutes    Flexion Limitations to tolerance      Shoulder Exercises: Isometric Strengthening   Flexion 5X5"    Extension 5X5"   neutral     Vasopneumatic   Number Minutes Vasopneumatic  10 minutes    Vasopnuematic Location  Shoulder   L   Vasopneumatic Pressure Low    Vasopneumatic Temperature  34      Manual Therapy   Manual Therapy Soft tissue mobilization;Passive ROM;Myofascial release     Manual therapy comments supine    Soft tissue mobilization STM to L proximal biceps, pec, anterior and lateral delt    Myofascial Release manual TPR to L proximal biceps, pec, anterior and lateral delt   c/o TTP and soft tissue restriction which improved                   PT Education - 08/01/21 1341     Education Details Reviewed p/o HEP withaddition of gentle isometrics.    Person(s) Educated Patient    Methods Handout;Demonstration;Explanation    Comprehension Verbalized understanding;Returned demonstration              PT Short Term Goals - 07/20/21 1105       PT SHORT TERM GOAL #1   Title Pt will be independent with initial HEP.    Status Achieved      PT SHORT TERM GOAL #2   Title Pt will verbalize techinques to address swelling and inflammation (PRICE).    Time 3    Period Weeks    Status Achieved    Target Date 07/20/21      PT SHORT TERM GOAL #3   Title Pt will be able to sleep through the night with appropriate props for shoulder.    Baseline slept sitting up last night    Time 3    Period Weeks    Status Achieved    Target Date 07/20/21               PT Long Term Goals - 07/24/21 1044       PT LONG TERM GOAL #1   Title Pt will be independent with advanced HEP.    Status Partially Met      PT LONG TERM GOAL #2   Title Patient will increase LUE AROM to [facility] to allow her to perform functional activities around home.    Status On-going      PT LONG TERM GOAL #3   Title Patient will increase LUE strength to at least 4+/5 to allow her to perform tasks in her home.    Status On-going      PT LONG TERM GOAL #4   Title Patient will increase L grip strength to within 10 pounds ot R grip strength to allow her to open containers.    Status On-going                   Plan - 08/01/21 1340     Clinical Impression Statement Pt tolerated interventions well today. per protocol gentle progressions provided with isometrics, submax  within tolerance at neutral arm positioning.  She did c/o some ant shoulder pain with AROM flexion supine with inccreased reps, educated in graded progression of ROM and reps as toelrated, taking breaks as needed for home. some TTP and mms tightness palpated anterior shoulder in region of pec minor, axilla that responded nicely to MT.  Pt also with question about elbow straightening, educated about improtance of practicing full ROM elbow extension as well as flexion as she seemed to be focusing a bit more on flexion part. Pt VU to questions answered today and HEP updated, plan to reassess and progress as toelrated in upcoming visits per protocol.    PT Treatment/Interventions ADLs/Self Care Home Management;Cryotherapy;Electrical Stimulation;Iontophoresis 72m/ml Dexamethasone;Moist Heat;Ultrasound;Functional mobility training;Therapeutic activities;Therapeutic exercise;Neuromuscular re-education;Patient/family education;Manual techniques;Scar mobilization;Passive range of motion;Dry needling;Taping;Joint Manipulations;Vasopneumatic Device;Spinal Manipulations    PT Next Visit Plan avoid abduction and cross body adduction for 6 weeks (per protocol), follow protocol under media, manual therapy and modalities as indicated, PROM - AAROM, gentle periscapular strengthening    PT Home Exercise Plan Access Code: [email or web address]   Consulted and Agree with Plan of Care Patient             Patient will benefit from skilled therapeutic intervention in order to improve the following deficits and impairments:  Decreased range of motion, Increased muscle spasms, Impaired UE functional use, Decreased activity tolerance, Pain, Decreased strength, Postural dysfunction, Increased fascial restricitons, Impaired perceived functional ability  Visit Diagnosis: Stiffness of left shoulder, not elsewhere classified  Stiffness of left elbow, not elsewhere classified  Acute pain of left shoulder  Muscle weakness  (generalized)  Cramp and spasm  Abnormal posture     Problem List Patient Active Problem List   Diagnosis Date Noted   History of miscarriage 08/19/2015   Elevated WBC count 05/18/2015   Pneumococcal vaccination declined 03/15/2015   Diabetes [place], [place] 2 ([place] 12/23/2014   IMPAIRED GLUCOSE TOLERANCE TEST 10/13/2010    MHall Busing PT, DPT 08/01/2021, 1:44 PM  [place] [address]Phone: [phone]  Fax:  [phone] Name: Lynn BITTINGERMRN: [id number] of Birth: 810/22/1972

## 2021-08-03 ENCOUNTER — Other Ambulatory Visit: Payer: Self-pay

## 2021-08-03 ENCOUNTER — Ambulatory Visit: Payer: Medicaid Other

## 2021-08-03 DIAGNOSIS — M25612 Stiffness of left shoulder, not elsewhere classified: Secondary | ICD-10-CM | POA: Diagnosis not present

## 2021-08-03 DIAGNOSIS — M25512 Pain in left shoulder: Secondary | ICD-10-CM

## 2021-08-03 DIAGNOSIS — M6281 Muscle weakness (generalized): Secondary | ICD-10-CM

## 2021-08-03 DIAGNOSIS — R252 Cramp and spasm: Secondary | ICD-10-CM

## 2021-08-03 DIAGNOSIS — R293 Abnormal posture: Secondary | ICD-10-CM

## 2021-08-03 DIAGNOSIS — M25622 Stiffness of left elbow, not elsewhere classified: Secondary | ICD-10-CM

## 2021-08-03 NOTE — Therapy (Signed)
[place]. [place], [place], [address] Phone: [phone]   Fax:  [phone]  Physical Therapy Treatment  Patient Details  Name: Lynn Moody MRN: [id number] Date of Birth: [birth year] Referring Provider (PT): Ophelia Charter, MD   Encounter Date: 08/03/2021   PT End of Session - 08/03/21 1336     Visit Number 16    Number of Visits 24    Date for PT Re-Evaluation 09/14/21    Authorization Type Medicaid [employer]    Authorization Time Period 12 visits by 09/14/21 then transition to HEP    Authorization - Visit Number 3    Authorization - Number of Visits 12    PT Start Time 1300    PT Stop Time 1345    PT Time Calculation (min) 45 min    Activity Tolerance Patient tolerated treatment well;Patient limited by pain    Behavior During Therapy [facility] for tasks assessed/performed             Past Medical History:  Diagnosis Date   Anemia    Diabetes mellitus without complication ([place])    Hypertension     Past Surgical History:  Procedure Laterality Date   BREAST BIOPSY     left breast with maker placed   CESAREAN SECTION     DENTAL SURGERY     ORIF ELBOW FRACTURE Left 05/11/2021   Procedure: OPEN REDUCTION INTERNAL FIXATION DISTAL HUMERUS FRACTURE;  Surgeon: Hiram Gash, MD;  Location: [facility];  Service: Orthopedics;  Laterality: Left;   SHOULDER ARTHROSCOPY WITH DISTAL CLAVICLE RESECTION Left 06/28/2021   Procedure: SHOULDER ARTHROSCOPY WITH DISTAL CLAVICLE RESECTION;  Surgeon: Hiram Gash, MD;  Location: [place];  Service: Orthopedics;  Laterality: Left;   SHOULDER ARTHROSCOPY WITH SUBACROMIAL DECOMPRESSION AND BICEP TENDON REPAIR Left 06/28/2021   Procedure: SHOULDER ARTHROSCOPY WITH SUBACROMIAL DECOMPRESSION; PARTIAL ACROMIOPLASTY WITH CORACOACROMIAL RELEASE AND  BICEP TENDODESIS;  Surgeon: Hiram Gash, MD;  Location: [place];  Service: Orthopedics;   Laterality: Left;   ULNAR NERVE TRANSPOSITION Left 05/11/2021   Procedure: RADIAL  NERVE DECOMPRESSION;  Surgeon: Hiram Gash, MD;  Location: [place];  Service: Orthopedics;  Laterality: Left;    There were no vitals filed for this visit.   Subjective Assessment - 08/03/21 1340     Subjective felt good after last session no increased pain. did alot for work yesterday so arm was feeling a little more sore    Currently in Pain? Yes    Pain Score 4     Pain Location Shoulder    Pain Orientation Left                               [facility] Adult PT Treatment/Exercise - 08/03/21 0001                    Shoulder Exercises: Seated   External Rotation AAROM;Left;20 reps    External Rotation Limitations with wand; cues to maintain shoulder in neutral, pain free range.    Flexion AAROM;Left;10 reps    Flexion Limitations with dowel   within limited ROM, focusing on depressing L shoulder   Other Seated Exercises shoulder shruggs 2 x 10.  3" holds. backward shoulder rolls as tolerated 2 x 10    Other Seated Exercises R shoulder ER stretch in doorway 10x5"  Shoulder Exercises: Standing   Extension Limitations 2x10 to neutral, yellow TB, periscapular strengthening    Row Strengthening;Both;10 reps;Theraband    Theraband Level (Shoulder Row) Level 1 (Yellow)    Row Limitations 2x10   stopping at neutral and cueing to depress L shoulder     Shoulder Exercises: Pulleys   Flexion 3 minutes    Flexion Limitations to tolerance      Shoulder Exercises: Isometric Strengthening   Flexion 5X5"    Extension 5X5"   neutral     Vasopneumatic   Number Minutes Vasopneumatic  10 minutes    Vasopnuematic Location  Shoulder   L   Vasopneumatic Pressure Low    Vasopneumatic Temperature  34      Manual Therapy   Manual Therapy Soft tissue mobilization;Passive ROM;Myofascial release    Manual therapy comments sitting    Soft tissue mobilization STM to L  proximal biceps, pec, anterior and lateral delt    Myofascial Release manual TPR to L proximal biceps, pec, anterior and lateral delt   c/o TTP and soft tissue restriction which improved                      PT Short Term Goals - 07/20/21 1105       PT SHORT TERM GOAL #1   Title Pt will be independent with initial HEP.    Status Achieved      PT SHORT TERM GOAL #2   Title Pt will verbalize techinques to address swelling and inflammation (PRICE).    Time 3    Period Weeks    Status Achieved    Target Date 07/20/21      PT SHORT TERM GOAL #3   Title Pt will be able to sleep through the night with appropriate props for shoulder.    Baseline slept sitting up last night    Time 3    Period Weeks    Status Achieved    Target Date 07/20/21               PT Long Term Goals - 07/24/21 1044       PT LONG TERM GOAL #1   Title Pt will be independent with advanced HEP.    Status Partially Met      PT LONG TERM GOAL #2   Title Patient will increase LUE AROM to [facility] to allow her to perform functional activities around home.    Status On-going      PT LONG TERM GOAL #3   Title Patient will increase LUE strength to at least 4+/5 to allow her to perform tasks in her home.    Status On-going      PT LONG TERM GOAL #4   Title Patient will increase L grip strength to within 10 pounds ot R grip strength to allow her to open containers.    Status On-going                   Plan - 08/03/21 1337     Clinical Impression Statement Pt tolerated interventions well today. 5 weeks p/o with continued protocol progressions for gentle isometrivcs, periscapular strength using yellow TB to neutral. Cues needed for moving through comfortable range ending at neutral and prevention of scapualr compensations. Pt does express she gets alot of tension/tenderness anterior to shoulder, with palpation this was more in the region of left pecs with multiple areas of spasm, repsonded  fairly to MT. Continue per POC  progressing as per protocol    PT Treatment/Interventions ADLs/Self Care Home Management;Cryotherapy;Electrical Stimulation;Iontophoresis 34m/ml Dexamethasone;Moist Heat;Ultrasound;Functional mobility training;Therapeutic activities;Therapeutic exercise;Neuromuscular re-education;Patient/family education;Manual techniques;Scar mobilization;Passive range of motion;Dry needling;Taping;Joint Manipulations;Vasopneumatic Device;Spinal Manipulations    PT Next Visit Plan avoid abduction and cross body adduction for 6 weeks (per protocol), follow protocol under media, manual therapy and modalities as indicated, PROM - AAROM, gentle periscapular strengthening    PT Home Exercise Plan Access Code: [email or web address]   Consulted and Agree with Plan of Care Patient             Patient will benefit from skilled therapeutic intervention in order to improve the following deficits and impairments:  Decreased range of motion, Increased muscle spasms, Impaired UE functional use, Decreased activity tolerance, Pain, Decreased strength, Postural dysfunction, Increased fascial restricitons, Impaired perceived functional ability  Visit Diagnosis: Stiffness of left shoulder, not elsewhere classified  Stiffness of left elbow, not elsewhere classified  Acute pain of left shoulder  Muscle weakness (generalized)  Cramp and spasm  Abnormal posture     Problem List Patient Active Problem List   Diagnosis Date Noted   History of miscarriage 08/19/2015   Elevated WBC count 05/18/2015   Pneumococcal vaccination declined 03/15/2015   Di[place] 2 [place] 12/23/2014   IMPAIRED GLUCOSE TOLERANCE TEST 10/13/2010    MHall Busing PT, DPT 08/03/2021, 1:41 PM  [place] [address]Phone: [phone]  Fax:  [phone] Name: Lynn JOHNSMRN: [id number] of Birth:  818-Mar-1972

## 2021-08-07 ENCOUNTER — Encounter: Payer: Self-pay | Admitting: Physical Therapy

## 2021-08-07 ENCOUNTER — Ambulatory Visit: Payer: Medicaid Other | Admitting: Physical Therapy

## 2021-08-07 ENCOUNTER — Other Ambulatory Visit: Payer: Self-pay

## 2021-08-07 DIAGNOSIS — M6281 Muscle weakness (generalized): Secondary | ICD-10-CM

## 2021-08-07 DIAGNOSIS — M25622 Stiffness of left elbow, not elsewhere classified: Secondary | ICD-10-CM

## 2021-08-07 DIAGNOSIS — M25512 Pain in left shoulder: Secondary | ICD-10-CM

## 2021-08-07 DIAGNOSIS — M25612 Stiffness of left shoulder, not elsewhere classified: Secondary | ICD-10-CM | POA: Diagnosis not present

## 2021-08-07 NOTE — Therapy (Signed)
[place]. [place], [place], [address] Phone: [phone]   Fax:  [phone]  Physical Therapy Treatment  Patient Details  Name: Lynn Moody MRN: [id number] Date of Birth: [birth year] Referring Provider (PT): Ophelia Charter, MD   Encounter Date: 08/07/2021   PT End of Session - 08/07/21 1010     Visit Number 17    Date for PT Re-Evaluation 09/14/21    Authorization Type Medicaid [employer]    PT Start Time 0930    PT Stop Time 1020    PT Time Calculation (min) 50 min    Activity Tolerance Patient tolerated treatment well    Behavior During Therapy [facility] for tasks assessed/performed             Past Medical History:  Diagnosis Date   Anemia    Diabetes mellitus without complication ([place])    Hypertension     Past Surgical History:  Procedure Laterality Date   BREAST BIOPSY     left breast with maker placed   CESAREAN SECTION     DENTAL SURGERY     ORIF ELBOW FRACTURE Left 05/11/2021   Procedure: OPEN REDUCTION INTERNAL FIXATION DISTAL HUMERUS FRACTURE;  Surgeon: Hiram Gash, MD;  Location: [place];  Service: Orthopedics;  Laterality: Left;   SHOULDER ARTHROSCOPY WITH DISTAL CLAVICLE RESECTION Left 06/28/2021   Procedure: SHOULDER ARTHROSCOPY WITH DISTAL CLAVICLE RESECTION;  Surgeon: Hiram Gash, MD;  Location: [place];  Service: Orthopedics;  Laterality: Left;   SHOULDER ARTHROSCOPY WITH SUBACROMIAL DECOMPRESSION AND BICEP TENDON REPAIR Left 06/28/2021   Procedure: SHOULDER ARTHROSCOPY WITH SUBACROMIAL DECOMPRESSION; PARTIAL ACROMIOPLASTY WITH CORACOACROMIAL RELEASE AND  BICEP TENDODESIS;  Surgeon: Hiram Gash, MD;  Location: [place];  Service: Orthopedics;  Laterality: Left;   ULNAR NERVE TRANSPOSITION Left 05/11/2021   Procedure: RADIAL  NERVE DECOMPRESSION;  Surgeon: Hiram Gash, MD;  Location: [place];  Service: Orthopedics;   Laterality: Left;    There were no vitals filed for this visit.   Subjective Assessment - 08/07/21 0931     Subjective Shoulder was acting up this weekend. L shoulder soreness today    Currently in Pain? Yes    Pain Score 4     Pain Location Shoulder    Pain Orientation Left                               [facility] Adult PT Treatment/Exercise - 08/07/21 0001       Shoulder Exercises: Standing   External Rotation Left;20 reps;Theraband;Strengthening    Theraband Level (Shoulder External Rotation) Level 1 (Yellow)    Internal Rotation Strengthening;Left;20 reps;Theraband    Theraband Level (Shoulder Internal Rotation) Level 1 (Yellow)    Flexion AROM;Both;10 reps   to 90 deg   Extension Strengthening;Both;20 reps;Theraband    Theraband Level (Shoulder Extension) Level 2 (Red)    Extension Limitations to neutral, red TB, periscapular strengthening    Row Strengthening;Both;Theraband;20 reps    Theraband Level (Shoulder Row) Level 2 (Red)    Other Standing Exercises AAROM Flex, Ext, IR up back x10    Other Standing Exercises LUE 2 level cabinet reaches x10      Shoulder Exercises: ROM/Strengthening   UBE (Upper Arm Bike) L2.0 x3 min each way with L UE AAROM      Vasopneumatic   Number Minutes Vasopneumatic  10  minutes    Vasopnuematic Location  Shoulder    Vasopneumatic Pressure Low    Vasopneumatic Temperature  34      Manual Therapy   Manual Therapy Soft tissue mobilization;Passive ROM;Myofascial release    Manual therapy comments supine    Passive ROM L shoulder PROM flexion, IR/ER pain-free; L elbow flexion, extension, pronation/supination                       PT Short Term Goals - 07/20/21 1105       PT SHORT TERM GOAL #1   Title Pt will be independent with initial HEP.    Status Achieved      PT SHORT TERM GOAL #2   Title Pt will verbalize techinques to address swelling and inflammation (PRICE).    Time 3    Period Weeks     Status Achieved    Target Date 07/20/21      PT SHORT TERM GOAL #3   Title Pt will be able to sleep through the night with appropriate props for shoulder.    Baseline slept sitting up last night    Time 3    Period Weeks    Status Achieved    Target Date 07/20/21               PT Long Term Goals - 07/24/21 1044       PT LONG TERM GOAL #1   Title Pt will be independent with advanced HEP.    Status Partially Met      PT LONG TERM GOAL #2   Title Patient will increase LUE AROM to [facility] to allow her to perform functional activities around home.    Status On-going      PT LONG TERM GOAL #3   Title Patient will increase LUE strength to at least 4+/5 to allow her to perform tasks in her home.    Status On-going      PT LONG TERM GOAL #4   Title Patient will increase L grip strength to within 10 pounds ot R grip strength to allow her to open containers.    Status On-going                   Plan - 08/07/21 1010     Clinical Impression Statement Continues with gentle periscapular strengthening with the use of theraband. Some shoulder elevation noted with L shoulder flexion ROM. Pt RUE was very limited with IR AAROM with the cane. No reports of pain with the exercise interventions. Cue needed during MEto prevent guarding, end raing passive tightness noted.    Personal Factors and Comorbidities Fitness    Examination-Activity Limitations Reach Overhead;Dressing;Hygiene/Grooming;Carry;Sleep;Lift    Examination-Participation Restrictions Occupation;Driving;Meal Prep;Laundry;Cleaning    Stability/Clinical Decision Making Stable/Uncomplicated    Rehab Potential Good    PT Duration 8 weeks    PT Treatment/Interventions ADLs/Self Care Home Management;Cryotherapy;Electrical Stimulation;Iontophoresis 67m/ml Dexamethasone;Moist Heat;Ultrasound;Functional mobility training;Therapeutic activities;Therapeutic exercise;Neuromuscular re-education;Patient/family education;Manual  techniques;Scar mobilization;Passive range of motion;Dry needling;Taping;Joint Manipulations;Vasopneumatic Device;Spinal Manipulations    PT Next Visit Plan avoid abduction and cross body adduction for 6 weeks (per protocol), follow protocol under media, manual therapy and modalities as indicated, PROM - AAROM, gentle periscapular strengthening             Patient will benefit from skilled therapeutic intervention in order to improve the following deficits and impairments:  Decreased range of motion, Increased muscle spasms, Impaired UE functional use, Decreased activity tolerance, Pain,  Decreased strength, Postural dysfunction, Increased fascial restricitons, Impaired perceived functional ability  Visit Diagnosis: Stiffness of left elbow, not elsewhere classified  Stiffness of left shoulder, not elsewhere classified  Acute pain of left shoulder  Muscle weakness (generalized)     Problem List Patient Active Problem List   Diagnosis Date Noted   History of miscarriage 08/19/2015   Elevated WBC count 05/18/2015   Pneumococcal vaccination declined 03/15/2015   Diabetes mellitus, type 2 ([place]) 12/23/2014   IMPAIRED GLUCOSE TOLERANCE TEST 10/13/2010    Scot Jun, PTA 08/07/2021, 10:14 AM  [place]. [place], [place], [address] Phone: [phone]   Fax:  [phone]  Name: Lynn Moody MRN: [id number] Date of Birth: 08/09/1971

## 2021-08-10 ENCOUNTER — Encounter: Payer: Self-pay | Admitting: Physical Therapy

## 2021-08-10 ENCOUNTER — Ambulatory Visit: Payer: Medicaid Other | Admitting: Physical Therapy

## 2021-08-10 ENCOUNTER — Other Ambulatory Visit: Payer: Self-pay

## 2021-08-10 DIAGNOSIS — M25512 Pain in left shoulder: Secondary | ICD-10-CM

## 2021-08-10 DIAGNOSIS — M6281 Muscle weakness (generalized): Secondary | ICD-10-CM

## 2021-08-10 DIAGNOSIS — M25612 Stiffness of left shoulder, not elsewhere classified: Secondary | ICD-10-CM

## 2021-08-10 DIAGNOSIS — M25622 Stiffness of left elbow, not elsewhere classified: Secondary | ICD-10-CM

## 2021-08-10 NOTE — Therapy (Signed)
[place]. [place], [place], [address] Phone: [phone]   Fax:  [phone]  Physical Therapy Treatment  Patient Details  Name: Lynn Moody MRN: [id number] Date of Birth: July 10, 1971 Referring Provider (PT): Ophelia Charter, MD   Encounter Date: 08/10/2021   PT End of Session - 08/10/21 1333     Visit Number 18    Date for PT Re-Evaluation 09/14/21    Authorization Type Medicaid [employer]    Authorization Time Period 12 visits by 09/14/21 then transition to HEP    PT Start Time 1300    PT Stop Time 1345    PT Time Calculation (min) 45 min    Activity Tolerance Patient tolerated treatment well    Behavior During Therapy [facility] for tasks assessed/performed             Past Medical History:  Diagnosis Date   Anemia    Diabetes mellitus without complication ([place])    Hypertension     Past Surgical History:  Procedure Laterality Date   BREAST BIOPSY     left breast with maker placed   CESAREAN SECTION     DENTAL SURGERY     ORIF ELBOW FRACTURE Left 05/11/2021   Procedure: OPEN REDUCTION INTERNAL FIXATION DISTAL HUMERUS FRACTURE;  Surgeon: Hiram Gash, MD;  Location: [place];  Service: Orthopedics;  Laterality: Left;   SHOULDER ARTHROSCOPY WITH DISTAL CLAVICLE RESECTION Left 06/28/2021   Procedure: SHOULDER ARTHROSCOPY WITH DISTAL CLAVICLE RESECTION;  Surgeon: Hiram Gash, MD;  Location: [place];  Service: Orthopedics;  Laterality: Left;   SHOULDER ARTHROSCOPY WITH SUBACROMIAL DECOMPRESSION AND BICEP TENDON REPAIR Left 06/28/2021   Procedure: SHOULDER ARTHROSCOPY WITH SUBACROMIAL DECOMPRESSION; PARTIAL ACROMIOPLASTY WITH CORACOACROMIAL RELEASE AND  BICEP TENDODESIS;  Surgeon: Hiram Gash, MD;  Location: [place];  Service: Orthopedics;  Laterality: Left;   ULNAR NERVE TRANSPOSITION Left 05/11/2021   Procedure: RADIAL  NERVE DECOMPRESSION;  Surgeon: Hiram Gash, MD;  Location: [place];  Service: Orthopedics;  Laterality: Left;    There were no vitals filed for this visit.   Subjective Assessment - 08/10/21 1301     Subjective "Terrible today" A lot of pain at the incision area    Currently in Pain? Yes    Pain Score 6     Pain Location Shoulder    Pain Orientation Left                [facility] PT Assessment - 08/10/21 0001       AROM   Left Shoulder Flexion 126 Degrees    Left Shoulder ABduction 90 Degrees                           [facility] Adult PT Treatment/Exercise - 08/10/21 0001       Shoulder Exercises: Standing   External Rotation Left;20 reps;Theraband;Strengthening    Theraband Level (Shoulder External Rotation) Level 1 (Yellow)    Internal Rotation Strengthening;Left;20 reps;Theraband    Theraband Level (Shoulder Internal Rotation) Level 1 (Yellow)    Flexion AROM;Both;10 reps    ABduction AROM;Both;20 reps    Row Strengthening;Both;Theraband;20 reps    Theraband Level (Shoulder Row) Level 2 (Red)    Other Standing Exercises AAROM Flex, Ext, IR up back x10      Shoulder Exercises: ROM/Strengthening   UBE (Upper Arm Bike) L1.7 x3 min each way with  L UE AAROM      Vasopneumatic   Number Minutes Vasopneumatic  10 minutes    Vasopnuematic Location  Shoulder    Vasopneumatic Pressure Low    Vasopneumatic Temperature  34      Manual Therapy   Manual Therapy Passive ROM    Manual therapy comments sitting    Passive ROM L shoulder PROM flexion, IR/ER pain-free; L elbow flexion, extension, pronation/supination                       PT Short Term Goals - 07/20/21 1105       PT SHORT TERM GOAL #1   Title Pt will be independent with initial HEP.    Status Achieved      PT SHORT TERM GOAL #2   Title Pt will verbalize techinques to address swelling and inflammation (PRICE).    Time 3    Period Weeks    Status Achieved    Target Date 07/20/21      PT SHORT TERM  GOAL #3   Title Pt will be able to sleep through the night with appropriate props for shoulder.    Baseline slept sitting up last night    Time 3    Period Weeks    Status Achieved    Target Date 07/20/21               PT Long Term Goals - 07/24/21 1044       PT LONG TERM GOAL #1   Title Pt will be independent with advanced HEP.    Status Partially Met      PT LONG TERM GOAL #2   Title Patient will increase LUE AROM to [facility] to allow her to perform functional activities around home.    Status On-going      PT LONG TERM GOAL #3   Title Patient will increase LUE strength to at least 4+/5 to allow her to perform tasks in her home.    Status On-going      PT LONG TERM GOAL #4   Title Patient will increase L grip strength to within 10 pounds ot R grip strength to allow her to open containers.    Status On-going                   Plan - 08/10/21 1334     Clinical Impression Statement Pt is 6 weeks post op she enters clinic reporting increase pain over the incision area. no redness or swelling. No improvement made in L shoulder AROM. Tactile cue to L elbows needed to keep arm in correct position with external rotation. postural compensation noted due to lack of available L shoulder AROM with cabinet reaches.  Pt very guarded with PROM despite cue to relax.    Personal Factors and Comorbidities Fitness    Examination-Activity Limitations Reach Overhead;Dressing;Hygiene/Grooming;Carry;Sleep;Lift    Examination-Participation Restrictions Occupation;Driving;Meal Prep;Laundry;Cleaning    Stability/Clinical Decision Making Stable/Uncomplicated    Rehab Potential Good    PT Frequency Other (comment)    PT Duration 8 weeks    PT Treatment/Interventions ADLs/Self Care Home Management;Cryotherapy;Electrical Stimulation;Iontophoresis 17m/ml Dexamethasone;Moist Heat;Ultrasound;Functional mobility training;Therapeutic activities;Therapeutic exercise;Neuromuscular  re-education;Patient/family education;Manual techniques;Scar mobilization;Passive range of motion;Dry needling;Taping;Joint Manipulations;Vasopneumatic Device;Spinal Manipulations    PT Next Visit Plan avoid abduction and cross body adduction for 6 weeks (per protocol), follow protocol under media, manual therapy and modalities as indicated, PROM - AAROM, gentle periscapular strengthening  Patient will benefit from skilled therapeutic intervention in order to improve the following deficits and impairments:  Decreased range of motion, Increased muscle spasms, Impaired UE functional use, Decreased activity tolerance, Pain, Decreased strength, Postural dysfunction, Increased fascial restricitons, Impaired perceived functional ability  Visit Diagnosis: Stiffness of left elbow, not elsewhere classified  Acute pain of left shoulder  Muscle weakness (generalized)  Stiffness of left shoulder, not elsewhere classified     Problem List Patient Active Problem List   Diagnosis Date Noted   History of miscarriage 08/19/2015   Elevated WBC count 05/18/2015   Pneumococcal vaccination declined 03/15/2015   Diabetes mellitus, type 2 ([place]) 12/23/2014   IMPAIRED GLUCOSE TOLERANCE TEST 10/13/2010    Scot Jun, PTA 08/10/2021, 1:40 PM  [place]. [place], [place], [address] Phone: [phone]   Fax:  [phone]  Name: RAYNEISHA BOUZA MRN: [id number] Date of Birth: 1971-03-22

## 2021-08-14 ENCOUNTER — Ambulatory Visit: Payer: Medicaid Other | Admitting: Physical Therapy

## 2021-08-14 ENCOUNTER — Other Ambulatory Visit: Payer: Self-pay

## 2021-08-14 ENCOUNTER — Encounter: Payer: Self-pay | Admitting: Physical Therapy

## 2021-08-14 DIAGNOSIS — M25612 Stiffness of left shoulder, not elsewhere classified: Secondary | ICD-10-CM

## 2021-08-14 DIAGNOSIS — R252 Cramp and spasm: Secondary | ICD-10-CM

## 2021-08-14 DIAGNOSIS — M25512 Pain in left shoulder: Secondary | ICD-10-CM

## 2021-08-14 DIAGNOSIS — R293 Abnormal posture: Secondary | ICD-10-CM

## 2021-08-14 DIAGNOSIS — M25622 Stiffness of left elbow, not elsewhere classified: Secondary | ICD-10-CM

## 2021-08-14 DIAGNOSIS — M6281 Muscle weakness (generalized): Secondary | ICD-10-CM

## 2021-08-14 NOTE — Therapy (Signed)
[place]. [place], [place], [address] Phone: [phone]   Fax:  [phone]  Physical Therapy Treatment  Patient Details  Name: Lynn Moody MRN: [id number] Date of Birth: 1971-05-17 Referring Provider (PT): Ophelia Charter, MD   Encounter Date: 08/14/2021   PT End of Session - 08/14/21 1015     Visit Number 19    Number of Visits 24    Date for PT Re-Evaluation 09/14/21    Authorization Type Medicaid [employer]    Authorization Time Period 12 visits by 09/14/21 then transition to HEP    PT Start Time 0937   pt late   PT Stop Time 1023    PT Time Calculation (min) 46 min    Activity Tolerance Patient tolerated treatment well    Behavior During Therapy [facility] for tasks assessed/performed             Past Medical History:  Diagnosis Date   Anemia    Diabetes mellitus without complication ([place])    Hypertension     Past Surgical History:  Procedure Laterality Date   BREAST BIOPSY     left breast with maker placed   CESAREAN SECTION     DENTAL SURGERY     ORIF ELBOW FRACTURE Left 05/11/2021   Procedure: OPEN REDUCTION INTERNAL FIXATION DISTAL HUMERUS FRACTURE;  Surgeon: Hiram Gash, MD;  Location: [place];  Service: Orthopedics;  Laterality: Left;   SHOULDER ARTHROSCOPY WITH DISTAL CLAVICLE RESECTION Left 06/28/2021   Procedure: SHOULDER ARTHROSCOPY WITH DISTAL CLAVICLE RESECTION;  Surgeon: Hiram Gash, MD;  Location: [place];  Service: Orthopedics;  Laterality: Left;   SHOULDER ARTHROSCOPY WITH SUBACROMIAL DECOMPRESSION AND BICEP TENDON REPAIR Left 06/28/2021   Procedure: SHOULDER ARTHROSCOPY WITH SUBACROMIAL DECOMPRESSION; PARTIAL ACROMIOPLASTY WITH CORACOACROMIAL RELEASE AND  BICEP TENDODESIS;  Surgeon: Hiram Gash, MD;  Location: [place];  Service: Orthopedics;  Laterality: Left;   ULNAR NERVE TRANSPOSITION Left 05/11/2021   Procedure: RADIAL  NERVE  DECOMPRESSION;  Surgeon: Hiram Gash, MD;  Location: [place];  Service: Orthopedics;  Laterality: Left;    There were no vitals filed for this visit.   Subjective Assessment - 08/14/21 0938     Subjective Still having pain the the shoulder for the past 2 weeks. Got a couple days relief from steroid injection. Another MD appointment in 2 more weeks    Pertinent History DMII, previous L shoulder accident    Diagnostic tests MRI 06/01/20:  Mild tendinosis with tenosynovitis of biceps, mild AC joint OA    Patient Stated Goals To be able to move my hand back to normal again.    Currently in Pain? Yes    Pain Score 4     Pain Location Shoulder    Pain Orientation Left;Anterior    Pain Descriptors / Indicators Aching;Sharp    Pain Type Acute pain                               [facility] Adult PT Treatment/Exercise - 08/14/21 0001       Elbow Exercises   Elbow Flexion --   L elbow pronation/supination with 3/4 grip on 2lb WaTE bar 10x     Shoulder Exercises: Seated   Flexion AAROM;Left;10 reps    Flexion Limitations with dowel    Abduction AAROM;Left;10 reps    ABduction Limitations scaption with  dowel    Other Seated Exercises L shoulder AROM flexion 2x5, scaption 2x5      Shoulder Exercises: Sidelying   External Rotation AROM;Left;10 reps    External Rotation Limitations 2x15; ROM to tolerance   cueing and assistance for form and maintaining neutral shoulder     Shoulder Exercises: Standing   External Rotation Left;Theraband;Strengthening;10 reps    Theraband Level (Shoulder External Rotation) Level 1 (Yellow);Level 2 (Red)    External Rotation Weight (lbs) 2x10; cues for proper form and alignment while maintaining neutral shoulder    Internal Rotation Strengthening;Left;Theraband;10 reps    Theraband Level (Shoulder Internal Rotation) Level 1 (Yellow)    Internal Rotation Weight (lbs) 2x10; cues for proper form and alignment while maintaining  neutral shoulder      Shoulder Exercises: ROM/Strengthening   UBE (Upper Arm Bike) L1.7 x3 min each way with L UE AAROM      Vasopneumatic   Number Minutes Vasopneumatic  10 minutes    Vasopnuematic Location  Shoulder   L   Vasopneumatic Pressure Low    Vasopneumatic Temperature  34      Manual Therapy   Manual Therapy Joint mobilization;Soft tissue mobilization    Manual therapy comments sitting    Soft tissue mobilization cross friction massage to L proximal biceps tendon, STM biceps muscle belly, UT                       PT Short Term Goals - 07/20/21 1105       PT SHORT TERM GOAL #1   Title Pt will be independent with initial HEP.    Status Achieved      PT SHORT TERM GOAL #2   Title Pt will verbalize techinques to address swelling and inflammation (PRICE).    Time 3    Period Weeks    Status Achieved    Target Date 07/20/21      PT SHORT TERM GOAL #3   Title Pt will be able to sleep through the night with appropriate props for shoulder.    Baseline slept sitting up last night    Time 3    Period Weeks    Status Achieved    Target Date 07/20/21               PT Long Term Goals - 07/24/21 1044       PT LONG TERM GOAL #1   Title Pt will be independent with advanced HEP.    Status Partially Met      PT LONG TERM GOAL #2   Title Patient will increase LUE AROM to [facility] to allow her to perform functional activities around home.    Status On-going      PT LONG TERM GOAL #3   Title Patient will increase LUE strength to at least 4+/5 to allow her to perform tasks in her home.    Status On-going      PT LONG TERM GOAL #4   Title Patient will increase L grip strength to within 10 pounds ot R grip strength to allow her to open containers.    Status On-going                   Plan - 08/14/21 1021     Clinical Impression Statement Patient reports continued L anterior shoulder pain for the past 2 weeks. Notes short-lived pain relief with  injection. Provided cross friction massage to proximal biceps tendon as patient  located this as area of most pain. Also with significant soft tissue restriction evident in UT and scalenes today. Worked on shoulder AAROM and AROM within protocol limits. Patient does demonstrate overactivation of UT with overhead lifting, more evident with AROM vs. AAROM. Patient with c/o 6/10 pain with overhead lifting today. Elbow extension appeared much improved today. Patient also able to tolerate more challenging elbow rotation exercises today. Ended session with [place] to L shoulder to address pain. No further complaints at end of session.    Personal Factors and Comorbidities Fitness    Examination-Activity Limitations Reach Overhead;Dressing;Hygiene/Grooming;Carry;Sleep;Lift    Examination-Participation Restrictions Occupation;Driving;Meal Prep;Laundry;Cleaning    Stability/Clinical Decision Making Stable/Uncomplicated    Rehab Potential Good    PT Frequency Other (comment)    PT Duration 8 weeks    PT Treatment/Interventions ADLs/Self Care Home Management;Cryotherapy;Electrical Stimulation;Iontophoresis 54m/ml Dexamethasone;Moist Heat;Ultrasound;Functional mobility training;Therapeutic activities;Therapeutic exercise;Neuromuscular re-education;Patient/family education;Manual techniques;Scar mobilization;Passive range of motion;Dry needling;Taping;Joint Manipulations;Vasopneumatic Device;Spinal Manipulations    PT Next Visit Plan avoid abduction and cross body adduction for 6 weeks (per protocol), follow protocol under media, manual therapy and modalities as indicated, PROM - AAROM, gentle periscapular strengthening    Consulted and Agree with Plan of Care Patient             Patient will benefit from skilled therapeutic intervention in order to improve the following deficits and impairments:  Decreased range of motion, Increased muscle spasms, Impaired UE functional use, Decreased activity tolerance,  Pain, Decreased strength, Postural dysfunction, Increased fascial restricitons, Impaired perceived functional ability  Visit Diagnosis: Stiffness of left elbow, not elsewhere classified  Acute pain of left shoulder  Muscle weakness (generalized)  Stiffness of left shoulder, not elsewhere classified  Cramp and spasm  Abnormal posture     Problem List Patient Active Problem List   Diagnosis Date Noted   History of miscarriage 08/19/2015   Elevated WBC count 05/18/2015   Pneumococcal vaccination declined 03/15/2015   Diabetes mellitus, type 2 ([place] 12/23/2014   IMPAIRED GLUCOSE TOLERANCE TEST 10/13/2010    YJanene Harvey PT, DPT 08/14/21 10:31 AM   [place] [address]Phone: [phone]  Fax:  [phone] Name: Lynn SELBEMRN: [id number] of Birth: 8Aug 29, 1972

## 2021-08-17 ENCOUNTER — Ambulatory Visit: Payer: Medicaid Other | Admitting: Physical Therapy

## 2021-08-21 ENCOUNTER — Ambulatory Visit: Payer: Medicaid Other | Admitting: Physical Therapy

## 2021-08-24 ENCOUNTER — Ambulatory Visit: Payer: Medicaid Other | Admitting: Physical Therapy

## 2021-08-28 ENCOUNTER — Ambulatory Visit: Payer: Medicaid Other | Admitting: Rehabilitative and Restorative Service Providers"

## 2021-08-31 ENCOUNTER — Ambulatory Visit: Payer: Medicaid Other | Admitting: Physical Therapy

## 2021-09-07 ENCOUNTER — Encounter: Payer: Self-pay | Admitting: Physical Therapy

## 2021-09-07 ENCOUNTER — Ambulatory Visit: Payer: Medicaid Other | Attending: Orthopaedic Surgery | Admitting: Physical Therapy

## 2021-09-07 ENCOUNTER — Other Ambulatory Visit: Payer: Self-pay

## 2021-09-07 DIAGNOSIS — M25512 Pain in left shoulder: Secondary | ICD-10-CM | POA: Diagnosis not present

## 2021-09-07 DIAGNOSIS — R293 Abnormal posture: Secondary | ICD-10-CM | POA: Diagnosis present

## 2021-09-07 DIAGNOSIS — M6281 Muscle weakness (generalized): Secondary | ICD-10-CM | POA: Insufficient documentation

## 2021-09-07 DIAGNOSIS — M25612 Stiffness of left shoulder, not elsewhere classified: Secondary | ICD-10-CM | POA: Diagnosis present

## 2021-09-07 DIAGNOSIS — M25622 Stiffness of left elbow, not elsewhere classified: Secondary | ICD-10-CM | POA: Insufficient documentation

## 2021-09-07 DIAGNOSIS — R252 Cramp and spasm: Secondary | ICD-10-CM | POA: Insufficient documentation

## 2021-09-07 NOTE — Therapy (Signed)
[place]. [place], [place], [address] Phone: [phone]   Fax:  [phone]  Physical Therapy Treatment  Patient Details  Name: Lynn Moody MRN: [id number] Date of Birth: [birth year] Referring Provider (PT): Ophelia Charter, MD   Encounter Date: 09/07/2021   PT End of Session - 09/07/21 1341     Visit Number 20    Number of Visits 24    Date for PT Re-Evaluation 09/14/21    Authorization Type Medicaid [employer]    Authorization Time Period 12 visits by 09/14/21 then transition to HEP    PT Start Time 1306    PT Stop Time 1342    PT Time Calculation (min) 36 min             Past Medical History:  Diagnosis Date   Anemia    Diabetes mellitus without complication ([place])    Hypertension     Past Surgical History:  Procedure Laterality Date   BREAST BIOPSY     left breast with maker placed   CESAREAN SECTION     DENTAL SURGERY     ORIF ELBOW FRACTURE Left 05/11/2021   Procedure: OPEN REDUCTION INTERNAL FIXATION DISTAL HUMERUS FRACTURE;  Surgeon: Hiram Gash, MD;  Location: [place];  Service: Orthopedics;  Laterality: Left;   SHOULDER ARTHROSCOPY WITH DISTAL CLAVICLE RESECTION Left 06/28/2021   Procedure: SHOULDER ARTHROSCOPY WITH DISTAL CLAVICLE RESECTION;  Surgeon: Hiram Gash, MD;  Location: [place];  Service: Orthopedics;  Laterality: Left;   SHOULDER ARTHROSCOPY WITH SUBACROMIAL DECOMPRESSION AND BICEP TENDON REPAIR Left 06/28/2021   Procedure: SHOULDER ARTHROSCOPY WITH SUBACROMIAL DECOMPRESSION; PARTIAL ACROMIOPLASTY WITH CORACOACROMIAL RELEASE AND  BICEP TENDODESIS;  Surgeon: Hiram Gash, MD;  Location: [place];  Service: Orthopedics;  Laterality: Left;   ULNAR NERVE TRANSPOSITION Left 05/11/2021   Procedure: RADIAL  NERVE DECOMPRESSION;  Surgeon: Hiram Gash, MD;  Location: [place];  Service: Orthopedics;  Laterality: Left;     There were no vitals filed for this visit.   Subjective Assessment - 09/07/21 1308     Subjective [place] injection last week. "Doing ok, a little soreness but ok"    Currently in Pain? Yes    Pain Score 2     Pain Location Shoulder                               [facility] Adult PT Treatment/Exercise - 09/07/21 0001       Shoulder Exercises: Seated   Flexion AAROM;Left;10 reps    Flexion Limitations with dowel    Abduction AAROM;Left;10 reps    Other Seated Exercises L shoulder AROM flexion 2x10, scaption 2x10      Shoulder Exercises: Sidelying   External Rotation AROM;Left;20 reps      Shoulder Exercises: Standing   External Rotation Left;Theraband;Strengthening;20 reps    Theraband Level (Shoulder External Rotation) Level 2 (Red);Level 1 (Yellow)    External Rotation Weight (lbs) 2x10; cues for proper form and alignment while maintaining neutral shoulder    Internal Rotation Strengthening;Left;Theraband;10 reps    Theraband Level (Shoulder Internal Rotation) Level 2 (Red);Level 1 (Yellow)    Internal Rotation Weight (lbs) 2x10; cues for proper form and alignment while maintaining neutral shoulder    Extension Strengthening;Both;20 reps;Theraband    Theraband Level (Shoulder Extension) Level 2 (Red)    Row Strengthening;Both;Theraband;20 reps  Theraband Level (Shoulder Row) Level 2 (Red)    Other Standing Exercises triceps Ext 15lb 2x15      Shoulder Exercises: ROM/Strengthening   UBE (Upper Arm Bike) L1.7 x3 min each way with L UE AAROM      Manual Therapy   Manual Therapy Joint mobilization;Soft tissue mobilization    Passive ROM L shoulder PROM flexion, IR/ER pain-free; L elbow flexion, extension, pronation/supination                       PT Short Term Goals - 07/20/21 1105       PT SHORT TERM GOAL #1   Title Pt will be independent with initial HEP.    Status Achieved      PT SHORT TERM GOAL #2   Title Pt will verbalize  techinques to address swelling and inflammation (PRICE).    Time 3    Period Weeks    Status Achieved    Target Date 07/20/21      PT SHORT TERM GOAL #3   Title Pt will be able to sleep through the night with appropriate props for shoulder.    Baseline slept sitting up last night    Time 3    Period Weeks    Status Achieved    Target Date 07/20/21               PT Long Term Goals - 07/24/21 1044       PT LONG TERM GOAL #1   Title Pt will be independent with advanced HEP.    Status Partially Met      PT LONG TERM GOAL #2   Title Patient will increase LUE AROM to [facility] to allow her to perform functional activities around home.    Status On-going      PT LONG TERM GOAL #3   Title Patient will increase LUE strength to at least 4+/5 to allow her to perform tasks in her home.    Status On-going      PT LONG TERM GOAL #4   Title Patient will increase L grip strength to within 10 pounds ot R grip strength to allow her to open containers.    Status On-going                   Plan - 09/07/21 1342     Clinical Impression Statement Pt ~ 6 min late. Pt returns to therapy after three weeks, Pt reports that she has received and US guided injection last week and MD wanted her to wait until that before returning to therapy. Tactile cue given throughout out to maintain good posture and appropriate UE position with exercise intervention. Some pain and eng range tightness noted with PROM.    Personal Factors and Comorbidities Fitness    Examination-Activity Limitations Reach Overhead;Dressing;Hygiene/Grooming;Carry;Sleep;Lift    Examination-Participation Restrictions [place]    Stability/Clinical Decision Making Stable/Uncomplicated    Rehab Potential Good    PT Frequency Other (comment)    PT Duration 8 weeks    PT Treatment/Interventions ADLs/Self Care Home Management;Cryotherapy;Electrical Stimulation;Iontophoresis 37m/ml Dexamethasone;Moist Heat;Ultrasound;Functional  mobility training;Therapeutic activities;Therapeutic exercise;Neuromuscular re-education;Patient/family education;Manual techniques;Scar mobilization;Passive range of motion;Dry needling;Taping;Joint Manipulations;Vasopneumatic Device;Spinal Manipulations    PT Next Visit Plan avoid abduction and cross body adduction for 6 weeks (per protocol), follow protocol under media, manual therapy and modalities as indicated, PROM - AAROM, gentle periscapular strengthening             Patient will benefit  from skilled therapeutic intervention in order to improve the following deficits and impairments:  Decreased range of motion, Increased muscle spasms, Impaired UE functional use, Decreased activity tolerance, Pain, Decreased strength, Postural dysfunction, Increased fascial restricitons, Impaired perceived functional ability  Visit Diagnosis: Acute pain of left shoulder  Stiffness of left shoulder, not elsewhere classified  Muscle weakness (generalized)  Stiffness of left elbow, not elsewhere classified     Problem List Patient Active Problem List   Diagnosis Date Noted   History of miscarriage 08/19/2015   Elevated WBC count 05/18/2015   Pneumococcal vaccination declined 03/15/2015   Diabetes mellitus, type 2 ([place]) 12/23/2014   IMPAIRED GLUCOSE TOLERANCE TEST 10/13/2010    Scot Jun, PTA 09/07/2021, 1:47 PM  [place]. [place], [place], [address] Phone: [phone]   Fax:  [phone]  Name: Lynn Moody MRN: [id number] Date of Birth: 1971-04-05

## 2021-09-12 ENCOUNTER — Other Ambulatory Visit: Payer: Self-pay

## 2021-09-12 ENCOUNTER — Encounter: Payer: Self-pay | Admitting: Physical Therapy

## 2021-09-12 ENCOUNTER — Ambulatory Visit: Payer: Medicaid Other | Admitting: Physical Therapy

## 2021-09-12 DIAGNOSIS — M6281 Muscle weakness (generalized): Secondary | ICD-10-CM

## 2021-09-12 DIAGNOSIS — M25512 Pain in left shoulder: Secondary | ICD-10-CM | POA: Diagnosis not present

## 2021-09-12 DIAGNOSIS — M25612 Stiffness of left shoulder, not elsewhere classified: Secondary | ICD-10-CM

## 2021-09-12 DIAGNOSIS — R252 Cramp and spasm: Secondary | ICD-10-CM

## 2021-09-12 NOTE — Therapy (Signed)
[place]. [place], [place], [address] Phone: [phone]   Fax:  [phone]  Physical Therapy Treatment  Patient Details  Name: Lynn Moody MRN: [id number] Date of Birth: 11-18-1971 Referring Provider (PT): Ophelia Charter, MD   Encounter Date: 09/12/2021   PT End of Session - 09/12/21 1643     Visit Number 21    Date for PT Re-Evaluation 09/14/21    Authorization Type Medicaid [employer]    Authorization Time Period 12 visits by 09/14/21 then transition to HEP    PT Start Time 1609    PT Stop Time 1643    PT Time Calculation (min) 34 min    Activity Tolerance Patient tolerated treatment well    Behavior During Therapy [facility] for tasks assessed/performed             Past Medical History:  Diagnosis Date   Anemia    Diabetes mellitus without complication ([place])    Hypertension     Past Surgical History:  Procedure Laterality Date   BREAST BIOPSY     left breast with maker placed   CESAREAN SECTION     DENTAL SURGERY     ORIF ELBOW FRACTURE Left 05/11/2021   Procedure: OPEN REDUCTION INTERNAL FIXATION DISTAL HUMERUS FRACTURE;  Surgeon: Hiram Gash, MD;  Location: [place];  Service: Orthopedics;  Laterality: Left;   SHOULDER ARTHROSCOPY WITH DISTAL CLAVICLE RESECTION Left 06/28/2021   Procedure: SHOULDER ARTHROSCOPY WITH DISTAL CLAVICLE RESECTION;  Surgeon: Hiram Gash, MD;  Location: [place];  Service: Orthopedics;  Laterality: Left;   SHOULDER ARTHROSCOPY WITH SUBACROMIAL DECOMPRESSION AND BICEP TENDON REPAIR Left 06/28/2021   Procedure: SHOULDER ARTHROSCOPY WITH SUBACROMIAL DECOMPRESSION; PARTIAL ACROMIOPLASTY WITH CORACOACROMIAL RELEASE AND  BICEP TENDODESIS;  Surgeon: Hiram Gash, MD;  Location: [place];  Service: Orthopedics;  Laterality: Left;   ULNAR NERVE TRANSPOSITION Left 05/11/2021   Procedure: RADIAL  NERVE DECOMPRESSION;  Surgeon: Hiram Gash, MD;  Location: [place];  Service: Orthopedics;  Laterality: Left;    There were no vitals filed for this visit.   Subjective Assessment - 09/12/21 1610     Subjective soreness and pain in the L shoulder with activity    Currently in Pain? Yes    Pain Score 3     Pain Location Shoulder    Pain Orientation Left                               [facility] Adult PT Treatment/Exercise - 09/12/21 0001       Shoulder Exercises: Standing   Horizontal ABduction Strengthening;Both;20 reps;Theraband    Theraband Level (Shoulder Horizontal ABduction) Level 2 (Red)    External Rotation Left;Theraband;Strengthening;20 reps    Theraband Level (Shoulder External Rotation) Level 2 (Red);Level 1 (Yellow)    Internal Rotation Strengthening;Left;Theraband;10 reps    Theraband Level (Shoulder Internal Rotation) Level 2 (Red);Level 1 (Yellow)    Flexion Both;20 reps;Weights;Strengthening    ABduction Strengthening;Both;20 reps;Weights    Shoulder ABduction Weight (lbs) 1    Extension 20 reps;Strengthening;Both;Weights    Extension Weight (lbs) 5    Row Strengthening;20 reps;Weights;Both    Row Weight (lbs) 5      Shoulder Exercises: ROM/Strengthening   UBE (Upper Arm Bike) L1.7 x3 min each way with L UE AAROM    Other ROM/Strengthening Exercises Row & Lats 20lb  2x10      Manual Therapy   Manual Therapy Joint mobilization;Soft tissue mobilization    Passive ROM L shoulder PROM flexion, IR/ER pain-free; L elbow flexion, extension, pronation/supination                       PT Short Term Goals - 07/20/21 1105       PT SHORT TERM GOAL #1   Title Pt will be independent with initial HEP.    Status Achieved      PT SHORT TERM GOAL #2   Title Pt will verbalize techinques to address swelling and inflammation (PRICE).    Time 3    Period Weeks    Status Achieved    Target Date 07/20/21      PT SHORT TERM GOAL #3   Title Pt will be able to  sleep through the night with appropriate props for shoulder.    Baseline slept sitting up last night    Time 3    Period Weeks    Status Achieved    Target Date 07/20/21               PT Long Term Goals - 07/24/21 1044       PT LONG TERM GOAL #1   Title Pt will be independent with advanced HEP.    Status Partially Met      PT LONG TERM GOAL #2   Title Patient will increase LUE AROM to [facility] to allow her to perform functional activities around home.    Status On-going      PT LONG TERM GOAL #3   Title Patient will increase LUE strength to at least 4+/5 to allow her to perform tasks in her home.    Status On-going      PT LONG TERM GOAL #4   Title Patient will increase L grip strength to within 10 pounds ot R grip strength to allow her to open containers.    Status On-going                   Plan - 09/12/21 1645     Clinical Impression Statement Pt ~ 9 minutes late for today's session. Pt able to progress to some light machine level strengthening interventions without issue. Tactile cue to L elbow to maintain correct arm positioning with internal and external rotation. Sight L shoulder elevation noted with abduction. Some pain at end range of MT.    Personal Factors and Comorbidities Fitness    Examination-Activity Limitations Reach Overhead;Dressing;Hygiene/Grooming;Carry;Sleep;Lift    Stability/Clinical Decision Making Stable/Uncomplicated    Rehab Potential Good    PT Frequency Other (comment)    PT Duration 8 weeks    PT Treatment/Interventions ADLs/Self Care Home Management;Cryotherapy;Electrical Stimulation;Iontophoresis 11m/ml Dexamethasone;Moist Heat;Ultrasound;Functional mobility training;Therapeutic activities;Therapeutic exercise;Neuromuscular re-education;Patient/family education;Manual techniques;Scar mobilization;Passive range of motion;Dry needling;Taping;Joint Manipulations;Vasopneumatic Device;Spinal Manipulations    PT Next Visit Plan avoid  abduction and cross body adduction for 6 weeks (per protocol), follow protocol under media, manual therapy and modalities as indicated, PROM - AAROM, gentle periscapular strengthening             Patient will benefit from skilled therapeutic intervention in order to improve the following deficits and impairments:  Decreased range of motion, Increased muscle spasms, Impaired UE functional use, Decreased activity tolerance, Pain, Decreased strength, Postural dysfunction, Increased fascial restricitons, Impaired perceived functional ability  Visit Diagnosis: Stiffness of left shoulder, not elsewhere classified  Muscle weakness (generalized)  Acute  pain of left shoulder  Cramp and spasm     Problem List Patient Active Problem List   Diagnosis Date Noted   History of miscarriage 08/19/2015   Elevated WBC count 05/18/2015   Pneumococcal vaccination declined 03/15/2015   Diabetes mellitus, type 2 ([place]) 12/23/2014   IMPAIRED GLUCOSE TOLERANCE TEST 10/13/2010    Scot Jun, PTA 09/12/2021, 4:50 PM  [place]. [place], [place], [address] Phone: [phone]   Fax:  [phone]  Name: Lynn Moody MRN: [id number] Date of Birth: 11/06/1971

## 2021-09-14 ENCOUNTER — Encounter: Payer: Self-pay | Admitting: Physical Therapy

## 2021-09-14 ENCOUNTER — Other Ambulatory Visit: Payer: Self-pay

## 2021-09-14 ENCOUNTER — Ambulatory Visit: Payer: Medicaid Other | Admitting: Physical Therapy

## 2021-09-14 DIAGNOSIS — M6281 Muscle weakness (generalized): Secondary | ICD-10-CM

## 2021-09-14 DIAGNOSIS — M25512 Pain in left shoulder: Secondary | ICD-10-CM

## 2021-09-14 DIAGNOSIS — M25612 Stiffness of left shoulder, not elsewhere classified: Secondary | ICD-10-CM

## 2021-09-14 DIAGNOSIS — R293 Abnormal posture: Secondary | ICD-10-CM

## 2021-09-14 NOTE — Patient Instructions (Signed)
Access Code: [email or web address] URL: [email or web address] Date: 09/14/2021 Prepared by: Ethel Rana  Exercises Shoulder Flexion Wall Slide with Towel - 1 x daily - 7 x weekly - 1 sets - 10 reps Shoulder Scaption Wall Slide with Towel - 1 x daily - 7 x weekly - 1 sets - 10 reps Standing Shoulder Row with Anchored Resistance - 1 x daily - 7 x weekly - 1 sets - 10 reps Shoulder extension with resistance - Neutral - 1 x daily - 7 x weekly - 1 sets - 10 reps Shoulder Internal Rotation with Resistance - 1 x daily - 7 x weekly - 1 sets - 10 reps Shoulder External Rotation with Anchored Resistance - 1 x daily - 7 x weekly - 1 sets - 10 reps

## 2021-09-14 NOTE — Therapy (Signed)
[place]. [place], [place], [address] Phone: [phone]   Fax:  [phone]  Physical Therapy Treatment and Discharge Summary  Patient Details  Name: Lynn Moody MRN: [id number] Date of Birth: 26-May-1971 Referring Provider (PT): Ophelia Charter, MD   Encounter Date: 09/14/2021   PT End of Session - 09/14/21 1704     Visit Number 25    PT Start Time 4580    PT Stop Time 9983    PT Time Calculation (min) 40 min    Activity Tolerance Patient tolerated treatment well    Behavior During Therapy [facility] for tasks assessed/performed             Past Medical History:  Diagnosis Date   Anemia    Diabetes mellitus without complication ([place])    Hypertension     Past Surgical History:  Procedure Laterality Date   BREAST BIOPSY     left breast with maker placed   CESAREAN SECTION     DENTAL SURGERY     ORIF ELBOW FRACTURE Left 05/11/2021   Procedure: OPEN REDUCTION INTERNAL FIXATION DISTAL HUMERUS FRACTURE;  Surgeon: Hiram Gash, MD;  Location: [place];  Service: Orthopedics;  Laterality: Left;   SHOULDER ARTHROSCOPY WITH DISTAL CLAVICLE RESECTION Left 06/28/2021   Procedure: SHOULDER ARTHROSCOPY WITH DISTAL CLAVICLE RESECTION;  Surgeon: Hiram Gash, MD;  Location: [place];  Service: Orthopedics;  Laterality: Left;   SHOULDER ARTHROSCOPY WITH SUBACROMIAL DECOMPRESSION AND BICEP TENDON REPAIR Left 06/28/2021   Procedure: SHOULDER ARTHROSCOPY WITH SUBACROMIAL DECOMPRESSION; PARTIAL ACROMIOPLASTY WITH CORACOACROMIAL RELEASE AND  BICEP TENDODESIS;  Surgeon: Hiram Gash, MD;  Location: [place];  Service: Orthopedics;  Laterality: Left;   ULNAR NERVE TRANSPOSITION Left 05/11/2021   Procedure: RADIAL  NERVE DECOMPRESSION;  Surgeon: Hiram Gash, MD;  Location: [place];  Service: Orthopedics;  Laterality: Left;    There were no vitals filed for this  visit.   Subjective Assessment - 09/14/21 1505     Subjective Cpontinues to have some pain in L shoulder, taking medication. Feels HEP is still challenging her.    Pertinent History DMII, previous L shoulder accident                               [facility] Adult PT Treatment/Exercise - 09/14/21 0001       Shoulder Exercises: Standing   External Rotation Left;Theraband;Strengthening;20 reps    Theraband Level (Shoulder External Rotation) Level 2 (Red)    Internal Rotation Strengthening;Left;Theraband;10 reps    Theraband Level (Shoulder Internal Rotation) Level 2 (Red)    Extension 20 reps;Strengthening;Both;Weights    Row Strengthening;Left;Both;10 reps;Theraband    Theraband Level (Shoulder Row) Level 2 (Red)      Manual Therapy   Manual therapy comments Therapist educated patient to perform self mobiization for humeral depression and pendulum exercises with 2# weight.                     PT Education - 09/14/21 1703     Education Details Updated HEP to include self mobilization for humeral depression, scapular stabilization/strengthening.    Person(s) Educated Patient    Methods Explanation;Demonstration    Comprehension Verbalized understanding;Returned demonstration              PT Short Term Goals - 07/20/21 1105  PT SHORT TERM GOAL #1   Title Pt will be independent with initial HEP.    Status Achieved      PT SHORT TERM GOAL #2   Title Pt will verbalize techinques to address swelling and inflammation (PRICE).    Time 3    Period Weeks    Status Achieved    Target Date 07/20/21      PT SHORT TERM GOAL #3   Title Pt will be able to sleep through the night with appropriate props for shoulder.    Baseline slept sitting up last night    Time 3    Period Weeks    Status Achieved    Target Date 07/20/21               PT Long Term Goals - 09/14/21 1706       PT LONG TERM GOAL #1   Title Pt will be independent with  advanced HEP.    Status Achieved      PT LONG TERM GOAL #2   Title Patient will increase LUE AROM to [facility] to allow her to perform functional activities around home.    Baseline Limited by pain and weakness.    Status Not Met      PT LONG TERM GOAL #3   Title Patient will increase LUE strength to at least 4+/5 to allow her to perform tasks in her home.    Baseline (3-)-3/5    Status Not Met      PT LONG TERM GOAL #4   Title Patient will increase L grip strength to within 10 pounds ot R grip strength to allow her to open containers.    Status Not Met                   Plan - 09/14/21 1705     Clinical Impression Statement D/C per insurance.    Personal Factors and Comorbidities Fitness    Examination-Activity Limitations Reach Overhead;Dressing;Hygiene/Grooming;Carry;Sleep;Lift    Stability/Clinical Decision Making Stable/Uncomplicated    Clinical Decision Making Low    PT Treatment/Interventions ADLs/Self Care Home Management;Cryotherapy;Electrical Stimulation;Iontophoresis 98m/ml Dexamethasone;Moist Heat;Ultrasound;Functional mobility training;Therapeutic activities;Therapeutic exercise;Neuromuscular re-education;Patient/family education;Manual techniques;Scar mobilization;Passive range of motion;Dry needling;Taping;Joint Manipulations;Vasopneumatic Device;Spinal Manipulations             Patient will benefit from skilled therapeutic intervention in order to improve the following deficits and impairments:     Visit Diagnosis: Stiffness of left shoulder, not elsewhere classified  Muscle weakness (generalized)  Abnormal posture  Acute pain of left shoulder     Problem List Patient Active Problem List   Diagnosis Date Noted   History of miscarriage 08/19/2015   Elevated WBC count 05/18/2015   Pneumococcal vaccination declined 03/15/2015   Diabetes mellitus, type 2 ([place] 12/23/2014   IMPAIRED GLUCOSE TOLERANCE TEST 10/13/2010   PHYSICAL THERAPY DISCHARGE  SUMMARY  Visits from Start of Care: 22  Current functional level related to goals / functional outcomes: See goals   Remaining deficits: See goals   Education / Equipment: HEP updated, finalized, patient educated  Patient agrees to discharge. Patient goals were partially met. Patient is being discharged due to financial reasons.  SEthel RanaDPT 09/14/21 5:12 PM   [place] [address]Phone: [phone]  Fax:  [phone] Name: NSALAYA HOLTROPMRN: [id number] of Birth: 8September 16, 1972

## 2021-09-19 ENCOUNTER — Ambulatory Visit: Payer: Medicaid Other | Admitting: Physical Therapy

## 2021-09-26 ENCOUNTER — Encounter: Payer: Medicaid Other | Admitting: Physical Therapy

## 2021-11-30 ENCOUNTER — Ambulatory Visit: Payer: Medicaid Other | Attending: Orthopaedic Surgery | Admitting: Physical Therapy

## 2021-11-30 ENCOUNTER — Encounter: Payer: Self-pay | Admitting: Physical Therapy

## 2021-11-30 ENCOUNTER — Other Ambulatory Visit: Payer: Self-pay

## 2021-11-30 DIAGNOSIS — M25622 Stiffness of left elbow, not elsewhere classified: Secondary | ICD-10-CM | POA: Insufficient documentation

## 2021-11-30 DIAGNOSIS — M25612 Stiffness of left shoulder, not elsewhere classified: Secondary | ICD-10-CM | POA: Diagnosis present

## 2021-11-30 DIAGNOSIS — R252 Cramp and spasm: Secondary | ICD-10-CM | POA: Insufficient documentation

## 2021-11-30 DIAGNOSIS — R293 Abnormal posture: Secondary | ICD-10-CM | POA: Diagnosis present

## 2021-11-30 DIAGNOSIS — R600 Localized edema: Secondary | ICD-10-CM | POA: Insufficient documentation

## 2021-11-30 DIAGNOSIS — M6281 Muscle weakness (generalized): Secondary | ICD-10-CM | POA: Insufficient documentation

## 2021-11-30 DIAGNOSIS — M25512 Pain in left shoulder: Secondary | ICD-10-CM | POA: Insufficient documentation

## 2021-11-30 NOTE — Patient Instructions (Signed)
Access Code: [email or web address] URL: [email or web address] Date: 11/30/2021 Prepared by: Ethel Rana  Exercises Shoulder Extension with Resistance - 1 x daily - 7 x weekly - 2 sets - 10 reps Standing Shoulder Row with Anchored Resistance - 1 x daily - 7 x weekly - 2 sets - 10 reps Shoulder External Rotation and Scapular Retraction with Resistance - 1 x daily - 7 x weekly - 2 sets - 10 reps

## 2021-11-30 NOTE — Therapy (Signed)
[place]. [place], [place], [address] Phone: [phone]   Fax:  [phone]  Physical Therapy Evaluation  Patient Details  Name: Lynn Moody MRN: [id number] Date of Birth: 12-Aug-1971 Referring Provider (PT): Ophelia Charter, MD   Encounter Date: 11/30/2021   PT End of Session - 11/30/21 1619     Visit Number 1    Number of Visits 16    Date for PT Re-Evaluation 01/25/22    Authorization Type Medicaid [employer]    PT Start Time 1400    PT Stop Time 1444    PT Time Calculation (min) 44 min    Activity Tolerance Patient tolerated treatment well    Behavior During Therapy [facility] for tasks assessed/performed             Past Medical History:  Diagnosis Date   Anemia    Diabetes mellitus without complication ([place])    Hypertension     Past Surgical History:  Procedure Laterality Date   BREAST BIOPSY     left breast with maker placed   CESAREAN SECTION     DENTAL SURGERY     ORIF ELBOW FRACTURE Left 05/11/2021   Procedure: OPEN REDUCTION INTERNAL FIXATION DISTAL HUMERUS FRACTURE;  Surgeon: Hiram Gash, MD;  Location: [place];  Service: Orthopedics;  Laterality: Left;   SHOULDER ARTHROSCOPY WITH DISTAL CLAVICLE RESECTION Left 06/28/2021   Procedure: SHOULDER ARTHROSCOPY WITH DISTAL CLAVICLE RESECTION;  Surgeon: Hiram Gash, MD;  Location: [place];  Service: Orthopedics;  Laterality: Left;   SHOULDER ARTHROSCOPY WITH SUBACROMIAL DECOMPRESSION AND BICEP TENDON REPAIR Left 06/28/2021   Procedure: SHOULDER ARTHROSCOPY WITH SUBACROMIAL DECOMPRESSION; PARTIAL ACROMIOPLASTY WITH CORACOACROMIAL RELEASE AND  BICEP TENDODESIS;  Surgeon: Hiram Gash, MD;  Location: [place];  Service: Orthopedics;  Laterality: Left;   ULNAR NERVE TRANSPOSITION Left 05/11/2021   Procedure: RADIAL  NERVE DECOMPRESSION;  Surgeon: Hiram Gash, MD;  Location: [place];   Service: Orthopedics;  Laterality: Left;    There were no vitals filed for this visit.    Subjective Assessment - 11/30/21 1406     Subjective Patient received PT after surgical scope of L shoulder for subacromial decompression, distal clavicle excision, biceps tenodesis, lysis of adhesions and manipulation under anesthesia 8/22. She was progressing, but had to stop treatment due to insurance. She returns with the same symptoms of pain in L shoulder, decreased ROM and weakness.    Pertinent History DMII, previous L shoulder accident    Diagnostic tests MRI 06/01/20:  Mild tendinosis with tenosynovitis of biceps, mild AC joint OA    Patient Stated Goals Use L arm more functionally without pain.    Currently in Pain? Yes    Pain Score 4     Pain Location Shoulder    Pain Orientation Right;Anterior;Proximal    Pain Descriptors / Indicators Sharp;Sore;Aching    Pain Type Chronic pain    Pain Radiating Towards Occasionally into elbow.    Pain Onset More than a month ago    Pain Frequency Intermittent    Aggravating Factors  weather, movmeent above head    Pain Relieving Factors Tylenol    Effect of Pain on Daily Activities Hurts during any dialy activity which requires her to lift the arm. She subconsciously limits movement due to fear of pain.    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    Leisure driving, travel      ROM / Strength   AROM / PROM / Strength AROM;PROM;Strength      AROM   Left Shoulder  Flexion 132 Degrees    Left Shoulder ABduction 109 Degrees    Left Elbow Flexion 132    Left Elbow Extension 0      PROM   Left Shoulder Flexion 146 Degrees    Left Shoulder ABduction 95 Degrees      Strength   Left Shoulder Flexion 3-/5    Left Shoulder ABduction 3-/5    Left Shoulder Internal Rotation 3/5    Left Shoulder External Rotation 3/5    Left Elbow Flexion 4-/5    Left Elbow Extension 4-/5      Palpation   Palpation comment TTP over L supraspinatus tendon and muscle belly.                        Objective measurements completed on examination: See above findings.                PT Education - 11/30/21 1436     Education Details HEP, POC    Person(s) Educated Patient    Methods Explanation;Demonstration;Handout    Comprehension Returned demonstration;Verbalized understanding              PT Short Term Goals - 11/30/21 1627       PT SHORT TERM GOAL #1   Title Pt will be independent with initial HEP.    Time 4    Period Weeks    Status New    Target Date 12/28/21      PT SHORT TERM GOAL #2   Baseline 43 adjusted    Time 8    Period Weeks    Status New    Target Date 01/25/22      PT SHORT TERM GOAL #3   Title --    Time 8    Period Weeks    Status New    Target Date 01/25/22      PT SHORT TERM GOAL #4   Title --               PT Long Term Goals - 11/30/21 1632       PT LONG TERM GOAL #1   Title Pt will be independent with advanced HEP.    Period Weeks    Status New    Target Date 01/25/22      PT LONG TERM GOAL #2   Title Patient will achieve a score of at least 67 on FOTO to move into stage 4 recovery    Baseline 43    Time 8    Period Weeks    Status New    Target Date 01/25/22      PT LONG TERM GOAL #3   Title Patient will increase LUE strength to at least 4+/5 to allow her to perform tasks  in her home.    Baseline (3-)-3/5    Time 8    Period Weeks    Status New    Target Date 01/25/22       PT LONG TERM GOAL #4   Title Patient will be able to sleep on her L side without increased shoulder pain.    Time 8    Period Weeks    Status New    Target Date 01/25/22      PT LONG TERM GOAL #5   Title Patient will perform all daily activities with L shoulder pain remaining < 4/10.    Baseline up to8/10    Time 8    Period Weeks    Status New    Target Date 01/25/22                    Plan - 11/30/21 1624     Clinical Impression Statement Patient returns for additional PT due to remaining pain, stiffness, weakness in L shoulder. Her A and PROM have imporve a few degrees, but she continues to be limited in functional use. She will benefit from PT to manage the pain in her shoulder, stretch and strengthen appropriate muscles in shoulder and trunk to allow functional painfree ROM and full functional use of her LUE    Personal Factors and Comorbidities Fitness    Examination-Activity Limitations Reach Overhead;Dressing;Hygiene/Grooming;Carry;Sleep;Lift    Examination-Participation Restrictions Church;Driving    Stability/Clinical Decision Making Stable/Uncomplicated    Clinical Decision Making Low    Rehab Potential Good    PT Frequency 2x / week    PT Duration 8 weeks    PT Treatment/Interventions ADLs/Self Care Home Management;Cryotherapy;Electrical Stimulation;Iontophoresis 4mg /ml Dexamethasone;Moist Heat;Ultrasound;Functional mobility training;Therapeutic activities;Therapeutic exercise;Neuromuscular re-education;Patient/family education;Manual techniques;Scar mobilization;Passive range of motion;Dry needling;Taping;Joint Manipulations;Vasopneumatic Device;Spinal Manipulations    PT Next Visit Plan Assess response to [place], progress STM, stretch, strengthening, update HEP.    PT Home Exercise Plan [id number]    Consulted and Agree with Plan of Care Patient             Patient will benefit from skilled therapeutic intervention in order to improve the following  deficits and impairments:  Decreased range of motion, Increased muscle spasms, Impaired UE functional use, Decreased activity tolerance, Pain, Decreased strength, Postural dysfunction, Increased fascial restricitons, Impaired perceived functional ability  Visit Diagnosis: Muscle weakness (generalized)  Stiffness of left shoulder, not elsewhere classified  Abnormal posture  Acute pain of left shoulder     Problem List Patient Active Problem List   Diagnosis Date Noted   History of miscarriage 08/19/2015   Elevated WBC count 05/18/2015   Pneumococcal vaccination declined 03/15/2015   Diabetes mellitus, type 2 ([place]) 12/23/2014   IMPAIRED GLUCOSE TOLERANCE TEST 10/13/2010    Marcelina Morel, DPT 11/30/2021, 4:38 PM  [place]. [place], [place], [address] Phone: [phone]   Fax:  [phone]  Name: Lynn Moody MRN: [id number] Date of Birth: 1971-11-19

## 2021-12-04 ENCOUNTER — Other Ambulatory Visit: Payer: Self-pay

## 2021-12-04 ENCOUNTER — Ambulatory Visit: Payer: Medicaid Other | Admitting: Physical Therapy

## 2021-12-04 ENCOUNTER — Encounter: Payer: Self-pay | Admitting: Physical Therapy

## 2021-12-04 DIAGNOSIS — M25512 Pain in left shoulder: Secondary | ICD-10-CM

## 2021-12-04 DIAGNOSIS — M6281 Muscle weakness (generalized): Secondary | ICD-10-CM

## 2021-12-04 DIAGNOSIS — R600 Localized edema: Secondary | ICD-10-CM

## 2021-12-04 DIAGNOSIS — M25612 Stiffness of left shoulder, not elsewhere classified: Secondary | ICD-10-CM

## 2021-12-04 DIAGNOSIS — R252 Cramp and spasm: Secondary | ICD-10-CM

## 2021-12-04 DIAGNOSIS — R293 Abnormal posture: Secondary | ICD-10-CM

## 2021-12-04 DIAGNOSIS — M25622 Stiffness of left elbow, not elsewhere classified: Secondary | ICD-10-CM

## 2021-12-04 NOTE — Therapy (Signed)
[facility]. [place], [place], [address] Phone: [phone]   Fax:  [phone]  Physical Therapy Treatment  Patient Details  Name: Lynn Moody MRN: [id number] Date of Birth: November 03, 1971 Referring Provider (PT): Ophelia Charter, MD   Encounter Date: 12/04/2021   PT End of Session - 12/04/21 1055     Visit Number 2    Number of Visits 16    Date for PT Re-Evaluation 01/25/22    Authorization Type Medicaid [employer]    PT Start Time 1022    PT Stop Time 1100    PT Time Calculation (min) 38 min    Activity Tolerance Patient tolerated treatment well    Behavior During Therapy WFL for tasks assessed/performed             Past Medical History:  Diagnosis Date   Anemia    Diabetes mellitus without complication ([place])    Hypertension     Past Surgical History:  Procedure Laterality Date   BREAST BIOPSY     left breast with maker placed   CESAREAN SECTION     DENTAL SURGERY     ORIF ELBOW FRACTURE Left 05/11/2021   Procedure: OPEN REDUCTION INTERNAL FIXATION DISTAL HUMERUS FRACTURE;  Surgeon: Hiram Gash, MD;  Location: [facility];  Service: Orthopedics;  Laterality: Left;   SHOULDER ARTHROSCOPY WITH DISTAL CLAVICLE RESECTION Left 06/28/2021   Procedure: SHOULDER ARTHROSCOPY WITH DISTAL CLAVICLE RESECTION;  Surgeon: Hiram Gash, MD;  Location: [facility];  Service: Orthopedics;  Laterality: Left;   SHOULDER ARTHROSCOPY WITH SUBACROMIAL DECOMPRESSION AND BICEP TENDON REPAIR Left 06/28/2021   Procedure: SHOULDER ARTHROSCOPY WITH SUBACROMIAL DECOMPRESSION; PARTIAL ACROMIOPLASTY WITH CORACOACROMIAL RELEASE AND  BICEP TENDODESIS;  Surgeon: Hiram Gash, MD;  Location: [facility];  Service: Orthopedics;  Laterality: Left;   ULNAR NERVE TRANSPOSITION Left 05/11/2021   Procedure: RADIAL  NERVE DECOMPRESSION;  Surgeon: Hiram Gash, MD;  Location: [facility];   Service: Orthopedics;  Laterality: Left;    There were no vitals filed for this visit.   Subjective Assessment - 12/04/21 1027     Subjective Patient reports the Iontophoresis helped decrease pain, but it has returned somewhat while performing her shoulder exercises.    Pertinent History DMII, previous L shoulder accident    Diagnostic tests MRI 06/01/20:  Mild tendinosis with tenosynovitis of biceps, mild AC joint OA    Patient Stated Goals Use L arm more functionally without pain.    Currently in Pain? No/denies    Pain Onset More than a month ago                               [facility] Adult PT Treatment/Exercise - 12/04/21 0001       Exercises   Exercises Shoulder      Shoulder Exercises: Seated   Other Seated Exercises Nu Step with U and LE L4 x 5 minutes.      Shoulder Exercises: Standing   Flexion Left;20 reps;Weights    Shoulder Flexion Weight (lbs) 1    ABduction Strengthening;Left;20 reps    Shoulder ABduction Weight (lbs) 1      Manual Therapy   Manual Therapy Joint mobilization;Soft tissue mobilization;Passive ROM    Joint Mobilization L shoulkde inferior, post, ant glides, grade 3, 2 x 10    Soft tissue mobilization cros sfriction to supraspinatus tendon,  biceps tendon 1 minutes each    Passive ROM L shoulder PROM flexion, abduciton 2 x 15 seconds                     PT Education - 12/04/21 1045     Education Details HEP    Person(s) Educated Patient    Methods Explanation;Demonstration;Handout    Comprehension Returned demonstration;Verbalized understanding              PT Short Term Goals - 11/30/21 1627       PT SHORT TERM GOAL #1   Title Pt will be independent with initial HEP.    Time 4    Period Weeks    Status New    Target Date 12/28/21      PT SHORT TERM GOAL #2   Baseline 43 adjusted    Time 8    Period Weeks    Status New    Target Date 01/25/22      PT SHORT TERM GOAL #3   Title --    Time 8     Period Weeks    Status New    Target Date 01/25/22      PT SHORT TERM GOAL #4   Title --               PT Long Term Goals - 11/30/21 1632       PT LONG TERM GOAL #1   Title Pt will be independent with advanced HEP.    Period Weeks    Status New    Target Date 01/25/22      PT LONG TERM GOAL #2   Title Patient will achieve a score of at least 67 on FOTO to move into stage 4 recovery    Baseline 43    Time 8    Period Weeks    Status New    Target Date 01/25/22      PT LONG TERM GOAL #3   Title Patient will increase LUE strength to at least 4+/5 to allow her to perform tasks in her home.    Baseline (3-)-3/5    Time 8    Period Weeks    Status New    Target Date 01/25/22      PT LONG TERM GOAL #4   Title Patient will be able to sleep on her L side without increased shoulder pain.    Time 8    Period Weeks    Status New    Target Date 01/25/22      PT LONG TERM GOAL #5   Title Patient will perform all daily activities with L shoulder pain remaining < 4/10.    Baseline up to8/10    Time 8    Period Weeks    Status New    Target Date 01/25/22                   Plan - 12/04/21 1056     Clinical Impression Statement Patient reports soreness after doing her exercises, she feels it is due to starting them back up after a break. Therapsit performed STM to supraspinatus and biceps tendon on L F/B stretch for flexion, abd and ER. She arrives a little late, so treatment modified, finishing with some self stretch and then Vaso for pain relief.    Personal Factors and Comorbidities Fitness    Examination-Activity Limitations Reach Overhead;Dressing;Hygiene/Grooming;Carry;Sleep;Lift    Examination-Participation Restrictions Church;Driving    Stability/Clinical Decision  Making Stable/Uncomplicated    Clinical Decision Making Low    Rehab Potential Good    PT Frequency 2x / week    PT Duration 8 weeks    PT Treatment/Interventions ADLs/Self Care Home  Management;Cryotherapy;Electrical Stimulation;Iontophoresis 4mg /ml Dexamethasone;Moist Heat;Ultrasound;Functional mobility training;Therapeutic activities;Therapeutic exercise;Neuromuscular re-education;Patient/family education;Manual techniques;Scar mobilization;Passive range of motion;Dry needling;Taping;Joint Manipulations;Vasopneumatic Device;Spinal Manipulations    PT Next Visit Plan Assess response to Ionto, progress STM, stretch, strengthening, update HEP.    PT Home Exercise Plan [phone]    Consulted and Agree with Plan of Care Patient             Patient will benefit from skilled therapeutic intervention in order to improve the following deficits and impairments:  Decreased range of motion, Increased muscle spasms, Impaired UE functional use, Decreased activity tolerance, Pain, Decreased strength, Postural dysfunction, Increased fascial restricitons, Impaired perceived functional ability  Visit Diagnosis: Muscle weakness (generalized)  Stiffness of left shoulder, not elsewhere classified  Abnormal posture  Acute pain of left shoulder  Cramp and spasm  Stiffness of left elbow, not elsewhere classified  Localized edema     Problem List Patient Active Problem List   Diagnosis Date Noted   History of miscarriage 08/19/2015   Elevated WBC count 05/18/2015   Pneumococcal vaccination declined 03/15/2015   Diabetes mellitus, type 2 ([place]) 12/23/2014   IMPAIRED GLUCOSE TOLERANCE TEST 10/13/2010    Marcelina Morel, DPT 12/04/2021, 10:59 AM  [facility]. [place], [place], [address] Phone: [phone]   Fax:  [phone]  Name: KIYANI JERNIGAN MRN: [id number] Date of Birth: May 10, 1971

## 2021-12-04 NOTE — Patient Instructions (Signed)
Access Code: [email or web address] URL: [email or web address] Date: 12/04/2021 Prepared by: Ethel Rana  Exercises Shoulder Flexion Wall Slide with Towel - 1 x daily - 7 x weekly - 1 sets - 5 reps - 10 hold Standing Shoulder Abduction Slides at Wall - 1 x daily - 7 x weekly - 1 sets - 5 reps - 10 hold Standing Shoulder Flexion to 90 Degrees with Dumbbells - 1 x daily - 7 x weekly - 2 sets - 10 reps Shoulder Abduction with Dumbbells - Thumbs Up - 1 x daily - 7 x weekly - 2 sets - 10 reps

## 2021-12-07 ENCOUNTER — Ambulatory Visit: Payer: Medicaid Other | Admitting: Physical Therapy

## 2021-12-07 ENCOUNTER — Encounter: Payer: Self-pay | Admitting: Physical Therapy

## 2021-12-07 ENCOUNTER — Other Ambulatory Visit: Payer: Self-pay

## 2021-12-07 DIAGNOSIS — M25622 Stiffness of left elbow, not elsewhere classified: Secondary | ICD-10-CM

## 2021-12-07 DIAGNOSIS — R293 Abnormal posture: Secondary | ICD-10-CM

## 2021-12-07 DIAGNOSIS — M6281 Muscle weakness (generalized): Secondary | ICD-10-CM

## 2021-12-07 DIAGNOSIS — M25612 Stiffness of left shoulder, not elsewhere classified: Secondary | ICD-10-CM

## 2021-12-07 DIAGNOSIS — R600 Localized edema: Secondary | ICD-10-CM

## 2021-12-07 DIAGNOSIS — M25512 Pain in left shoulder: Secondary | ICD-10-CM

## 2021-12-07 DIAGNOSIS — R252 Cramp and spasm: Secondary | ICD-10-CM

## 2021-12-07 NOTE — Patient Instructions (Signed)
Access Code: [email or web address] URL: [email or web address] Date: 12/07/2021 Prepared by: Ethel Rana  Exercises Shoulder Extension with Resistance - 1 x daily - 7 x weekly - 2 sets - 10 reps Standing Shoulder Row with Anchored Resistance - 1 x daily - 7 x weekly - 2 sets - 10 reps Shoulder External Rotation and Scapular Retraction with Resistance - 1 x daily - 7 x weekly - 2 sets - 10 reps Standing shoulder flexion wall slides - 1 x daily - 7 x weekly - 2 sets - 10 reps Standing Shoulder Abduction Wall Slide with Thumb Out - 1 x daily - 7 x weekly - 2 sets - 10 reps Standing Shoulder Internal Rotation Stretch with Towel - 1 x daily - 7 x weekly - 2 sets - 10 reps Standing Shoulder Flexion to 90 Degrees with Dumbbells - 1 x daily - 7 x weekly - 2 sets - 10 reps Shoulder Abduction with Dumbbells - Thumbs Up - 1 x daily - 7 x weekly - 2 sets - 10 reps

## 2021-12-07 NOTE — Therapy (Signed)
[facility]. [place], [place], [address] Phone: [phone]   Fax:  [phone]  Physical Therapy Treatment  Patient Details  Name: Lynn Moody MRN: [id number] Date of Birth: 28-Mar-1971 Referring Provider (PT): Ophelia Charter, MD   Encounter Date: 12/07/2021   PT End of Session - 12/07/21 1453     Visit Number 3    Number of Visits 16    Date for PT Re-Evaluation 01/25/22    PT Start Time 6962    PT Stop Time 1531    PT Time Calculation (min) 44 min    Activity Tolerance Patient tolerated treatment well    Behavior During Therapy [facility] for tasks assessed/performed             Past Medical History:  Diagnosis Date   Anemia    Diabetes mellitus without complication ([place])    Hypertension     Past Surgical History:  Procedure Laterality Date   BREAST BIOPSY     left breast with maker placed   CESAREAN SECTION     DENTAL SURGERY     ORIF ELBOW FRACTURE Left 05/11/2021   Procedure: OPEN REDUCTION INTERNAL FIXATION DISTAL HUMERUS FRACTURE;  Surgeon: Hiram Gash, MD;  Location: [facility];  Service: Orthopedics;  Laterality: Left;   SHOULDER ARTHROSCOPY WITH DISTAL CLAVICLE RESECTION Left 06/28/2021   Procedure: SHOULDER ARTHROSCOPY WITH DISTAL CLAVICLE RESECTION;  Surgeon: Hiram Gash, MD;  Location: [facility];  Service: Orthopedics;  Laterality: Left;   SHOULDER ARTHROSCOPY WITH SUBACROMIAL DECOMPRESSION AND BICEP TENDON REPAIR Left 06/28/2021   Procedure: SHOULDER ARTHROSCOPY WITH SUBACROMIAL DECOMPRESSION; PARTIAL ACROMIOPLASTY WITH CORACOACROMIAL RELEASE AND  BICEP TENDODESIS;  Surgeon: Hiram Gash, MD;  Location: [facility];  Service: Orthopedics;  Laterality: Left;   ULNAR NERVE TRANSPOSITION Left 05/11/2021   Procedure: RADIAL  NERVE DECOMPRESSION;  Surgeon: Hiram Gash, MD;  Location: [facility];  Service: Orthopedics;  Laterality: Left;     There were no vitals filed for this visit.   Subjective Assessment - 12/07/21 1450     Subjective Patient reports pain is imporving, the HEP continues to cause some soreness in the arm, but she is performing.    Pertinent History DMII, previous L shoulder accident    Diagnostic tests MRI 06/01/20:  Mild tendinosis with tenosynovitis of biceps, mild AC joint OA    Currently in Pain? Yes    Pain Score 2     Pain Location Shoulder    Pain Orientation Right;Anterior;Posterior    Pain Descriptors / Indicators Aching    Pain Type Chronic pain                               [facility] Adult PT Treatment/Exercise - 12/07/21 0001       Shoulder Exercises: Standing   Other Standing Exercises 1# weight in LUE, face wall, slide up wall into flexion, then pull weight away from wall , repeat in abd, then back to wall, use RUE holding weight to stretch up over head and back to touch wall. 10 reps each.    Other Standing Exercises Dowell stretch into ER, then lift dowell off back with BUE      Shoulder Exercises: ROM/Strengthening   UBE (Upper Arm Bike) L1.5 x3 min each way with L UE AAROM      Modalities   Modalities Iontophoresis  Iontophoresis   Type of Iontophoresis Dexamethasone    Location L shoulder    Dose 1.2cc    Time 4 hr.      Vasopneumatic   Number Minutes Vasopneumatic  15 minutes    Vasopnuematic Location  Shoulder    Vasopneumatic Pressure Low    Vasopneumatic Temperature  34      Manual Therapy   Manual Therapy Joint mobilization;Soft tissue mobilization;Passive ROM    Joint Mobilization L shoulde inferior, post, ant glides, grade 3, 2 x 10    Soft tissue mobilization cross friction to supraspinatus tendon, biceps tendon 1 minutes each    Passive ROM L shoulder PROM flexion, abduciton 2 x 15 seconds                     PT Education - 12/07/21 1534     Education Details HEP    Person(s) Educated Patient    Methods  Explanation;Demonstration;Handout    Comprehension Returned demonstration;Verbalized understanding              PT Short Term Goals - 12/07/21 1626       PT SHORT TERM GOAL #1   Title Pt will be independent with initial HEP.    Baseline updated    Time 3    Period Weeks    Status On-going    Target Date 12/28/21      PT SHORT TERM GOAL #2   Baseline 43 adjusted               PT Long Term Goals - 11/30/21 1632       PT LONG TERM GOAL #1   Title Pt will be independent with advanced HEP.    Period Weeks    Status New    Target Date 01/25/22      PT LONG TERM GOAL #2   Title Patient will achieve a score of at least 67 on FOTO to move into stage 4 recovery    Baseline 43    Time 8    Period Weeks    Status New    Target Date 01/25/22      PT LONG TERM GOAL #3   Title Patient will increase LUE strength to at least 4+/5 to allow her to perform tasks in her home.    Baseline (3-)-3/5    Time 8    Period Weeks    Status New    Target Date 01/25/22      PT LONG TERM GOAL #4   Title Patient will be able to sleep on her L side without increased shoulder pain.    Time 8    Period Weeks    Status New    Target Date 01/25/22      PT LONG TERM GOAL #5   Title Patient will perform all daily activities with L shoulder pain remaining < 4/10.    Baseline up to8/10    Time 8    Period Weeks    Status New    Target Date 01/25/22                   Plan - 12/07/21 1455     Clinical Impression Statement Patient reports no major issues today. She is still getting sore from HEp, but feels it is muscular soreness from the exercises. Therapsit continued to perform STM, joint mobs, and stretch. Added additional strength and ROM exercises to HEP and finished with Vaso for pain relief.  Paitne trequested Ionto at the very end of treatment.    Personal Factors and Comorbidities Fitness    Examination-Activity Limitations Reach  Overhead;Dressing;Hygiene/Grooming;Carry;Sleep;Lift    Examination-Participation Restrictions Church;Driving    Stability/Clinical Decision Making Stable/Uncomplicated    Clinical Decision Making Low    Rehab Potential Good    PT Frequency 2x / week    PT Duration 8 weeks    PT Treatment/Interventions ADLs/Self Care Home Management;Cryotherapy;Electrical Stimulation;Iontophoresis 4mg /ml Dexamethasone;Moist Heat;Ultrasound;Functional mobility training;Therapeutic activities;Therapeutic exercise;Neuromuscular re-education;Patient/family education;Manual techniques;Scar mobilization;Passive range of motion;Dry needling;Taping;Joint Manipulations;Vasopneumatic Device;Spinal Manipulations    PT Next Visit Plan Progress as tolerated.    PT Home Exercise Plan [phone]    Consulted and Agree with Plan of Care Patient             Patient will benefit from skilled therapeutic intervention in order to improve the following deficits and impairments:  Decreased range of motion, Increased muscle spasms, Impaired UE functional use, Decreased activity tolerance, Pain, Decreased strength, Postural dysfunction, Increased fascial restricitons, Impaired perceived functional ability  Visit Diagnosis: Muscle weakness (generalized)  Stiffness of left shoulder, not elsewhere classified  Abnormal posture  Acute pain of left shoulder  Cramp and spasm  Stiffness of left elbow, not elsewhere classified  Localized edema     Problem List Patient Active Problem List   Diagnosis Date Noted   History of miscarriage 08/19/2015   Elevated WBC count 05/18/2015   Pneumococcal vaccination declined 03/15/2015   Diabetes mellitus, type 2 ([place]) 12/23/2014   IMPAIRED GLUCOSE TOLERANCE TEST 10/13/2010    Marcelina Morel, DPT 12/07/2021, 4:28 PM  [facility]. [place], [place], [address] Phone: [phone]   Fax:  [phone]  Name: Lynn Moody MRN: [id number] Date of Birth: 04-30-1971

## 2021-12-11 ENCOUNTER — Ambulatory Visit: Payer: Medicaid Other | Admitting: Physical Therapy

## 2021-12-14 ENCOUNTER — Ambulatory Visit: Payer: Medicaid Other | Admitting: Physical Therapy

## 2021-12-14 ENCOUNTER — Other Ambulatory Visit: Payer: Self-pay

## 2021-12-14 ENCOUNTER — Encounter: Payer: Self-pay | Admitting: Physical Therapy

## 2021-12-14 DIAGNOSIS — M6281 Muscle weakness (generalized): Secondary | ICD-10-CM

## 2021-12-14 DIAGNOSIS — M25612 Stiffness of left shoulder, not elsewhere classified: Secondary | ICD-10-CM

## 2021-12-14 DIAGNOSIS — R252 Cramp and spasm: Secondary | ICD-10-CM

## 2021-12-14 DIAGNOSIS — M25512 Pain in left shoulder: Secondary | ICD-10-CM

## 2021-12-14 DIAGNOSIS — R293 Abnormal posture: Secondary | ICD-10-CM

## 2021-12-14 NOTE — Therapy (Signed)
[facility] [place], [place], [address] Phone: [phone]   Fax:  [phone]  Physical Therapy Treatment  Patient Details  Name: Lynn Moody MRN: [id number] Date of Birth: February 22, 1971 Referring Provider (PT): Ophelia Charter, MD   Encounter Date: 12/14/2021   PT End of Session - 12/14/21 1056     Visit Number 4    Date for PT Re-Evaluation 01/25/22    Authorization Type Medicaid [employer]    Authorization Time Period 12 visits by 09/14/21 then transition to HEP    Activity Tolerance Patient tolerated treatment well    Behavior During Therapy [facility] for tasks assessed/performed             Past Medical History:  Diagnosis Date   Anemia    Diabetes mellitus without complication ([place])    Hypertension     Past Surgical History:  Procedure Laterality Date   BREAST BIOPSY     left breast with maker placed   CESAREAN SECTION     DENTAL SURGERY     ORIF ELBOW FRACTURE Left 05/11/2021   Procedure: OPEN REDUCTION INTERNAL FIXATION DISTAL HUMERUS FRACTURE;  Surgeon: Hiram Gash, MD;  Location: [facility];  Service: Orthopedics;  Laterality: Left;   SHOULDER ARTHROSCOPY WITH DISTAL CLAVICLE RESECTION Left 06/28/2021   Procedure: SHOULDER ARTHROSCOPY WITH DISTAL CLAVICLE RESECTION;  Surgeon: Hiram Gash, MD;  Location: [facility];  Service: Orthopedics;  Laterality: Left;   SHOULDER ARTHROSCOPY WITH SUBACROMIAL DECOMPRESSION AND BICEP TENDON REPAIR Left 06/28/2021   Procedure: SHOULDER ARTHROSCOPY WITH SUBACROMIAL DECOMPRESSION; PARTIAL ACROMIOPLASTY WITH CORACOACROMIAL RELEASE AND  BICEP TENDODESIS;  Surgeon: Hiram Gash, MD;  Location: [place];  Service: Orthopedics;  Laterality: Left;   ULNAR NERVE TRANSPOSITION Left 05/11/2021   Procedure: RADIAL  NERVE DECOMPRESSION;  Surgeon: Hiram Gash, MD;  Location: [place] [facility];  Service: Orthopedics;  Laterality:  Left;    There were no vitals filed for this visit.   Subjective Assessment - 12/14/21 1017     Subjective Not doing too good today, with this rain    Currently in Pain? Yes    Pain Score 4     Pain Orientation Right                [facility] PT Assessment - 12/14/21 0001       AROM   Left Shoulder Flexion 139 Degrees    Left Shoulder ABduction 126 Degrees                           [facility] Adult PT Treatment/Exercise - 12/14/21 0001       Shoulder Exercises: Standing   External Rotation Left;Theraband;Strengthening;20 reps    Theraband Level (Shoulder External Rotation) Level 2 (Red)    Internal Rotation Strengthening;Left;Theraband;10 reps    Theraband Level (Shoulder Internal Rotation) Level 2 (Red)    Flexion Both;20 reps;Theraband    Shoulder Flexion Weight (lbs) 2    ABduction Strengthening;20 reps;Both    Shoulder ABduction Weight (lbs) 2    Extension 20 reps;Strengthening;Both;Weights    Extension Weight (lbs) 5    Other Standing Exercises Tricep Ext 20lb 2x15      Shoulder Exercises: ROM/Strengthening   UBE (Upper Arm Bike) L1.5 x3 min each way with L UE AAROM    Other ROM/Strengthening Exercises Row & Lats 20lb 2x10      Manual Therapy  Manual Therapy Joint mobilization;Soft tissue mobilization;Passive ROM    Joint Mobilization L shoulde inferior, post, ant glides, grade 3, 2 x 10    Soft tissue mobilization cross friction to supraspinatus tendon, biceps tendon 1 minutes each    Passive ROM L shoulder PROM flexion, abduciton 2 x 15 seconds                       PT Short Term Goals - 12/07/21 1626       PT SHORT TERM GOAL #1   Title Pt will be independent with initial HEP.    Baseline updated    Time 3    Period Weeks    Status On-going    Target Date 12/28/21      PT SHORT TERM GOAL #2   Baseline 43 adjusted               PT Long Term Goals - 12/14/21 1105       PT LONG TERM GOAL #1   Title Pt will be  independent with advanced HEP.    Status Partially Met      PT LONG TERM GOAL #2   Title Patient will achieve a score of at least 67 on FOTO to move into stage 4 recovery    Status On-going      PT LONG TERM GOAL #3   Title Patient will increase LUE strength to at least 4+/5 to allow her to perform tasks in her home.    Status On-going      PT LONG TERM GOAL #4   Title Patient will be able to sleep on her L side without increased shoulder pain.    Status On-going      PT LONG TERM GOAL #5   Title Patient will perform all daily activities with L shoulder pain remaining < 4/10.    Status Partially Met                   Plan - 12/14/21 1056     Clinical Impression Statement Pt enters clinic reporting increase L shoulder pain due to the rain.  Progressed to machine level strengthening without issue. Tactile cues to L elbow for positioning with external rotation. Some end range shoulder pain reported with PROM. Advised pt to start with lighter weight if she decides to go to the [facility]. Reports compliance with HEP>    Personal Factors and Comorbidities Fitness    Examination-Activity Limitations Reach Overhead;Dressing;Hygiene/Grooming;Carry;Sleep;Lift    Examination-Participation Restrictions Church;Driving    Stability/Clinical Decision Making Stable/Uncomplicated    Rehab Potential Good    PT Frequency 2x / week    PT Duration 8 weeks    PT Treatment/Interventions ADLs/Self Care Home Management;Cryotherapy;Electrical Stimulation;Iontophoresis 10m/ml Dexamethasone;Moist Heat;Ultrasound;Functional mobility training;Therapeutic activities;Therapeutic exercise;Neuromuscular re-education;Patient/family education;Manual techniques;Scar mobilization;Passive range of motion;Dry needling;Taping;Joint Manipulations;Vasopneumatic Device;Spinal Manipulations    PT Next Visit Plan Progress as tolerated.             Patient will benefit from skilled therapeutic intervention in order to  improve the following deficits and impairments:  Decreased range of motion, Increased muscle spasms, Impaired UE functional use, Decreased activity tolerance, Pain, Decreased strength, Postural dysfunction, Increased fascial restricitons, Impaired perceived functional ability  Visit Diagnosis: Muscle weakness (generalized)  Stiffness of left shoulder, not elsewhere classified  Abnormal posture  Acute pain of left shoulder  Cramp and spasm     Problem List Patient Active Problem List   Diagnosis Date Noted  History of miscarriage 08/19/2015   Elevated WBC count 05/18/2015   Pneumococcal vaccination declined 03/15/2015   Diabetes mellitus, type 2 ([place]) 12/23/2014   IMPAIRED GLUCOSE TOLERANCE TEST 10/13/2010    Scot Jun, PTA 12/14/2021, 11:06 AM  [facility]. [place], [place], [address] Phone: [phone]   Fax:  [phone]  Name: BONNA STEURY MRN: [id number] Date of Birth: [birth year]

## 2021-12-18 ENCOUNTER — Encounter: Payer: Self-pay | Admitting: Physical Therapy

## 2021-12-18 ENCOUNTER — Ambulatory Visit: Payer: Medicaid Other | Admitting: Physical Therapy

## 2021-12-18 ENCOUNTER — Other Ambulatory Visit: Payer: Self-pay

## 2021-12-18 DIAGNOSIS — R293 Abnormal posture: Secondary | ICD-10-CM

## 2021-12-18 DIAGNOSIS — M25612 Stiffness of left shoulder, not elsewhere classified: Secondary | ICD-10-CM

## 2021-12-18 DIAGNOSIS — M25512 Pain in left shoulder: Secondary | ICD-10-CM

## 2021-12-18 DIAGNOSIS — M6281 Muscle weakness (generalized): Secondary | ICD-10-CM

## 2021-12-18 DIAGNOSIS — R252 Cramp and spasm: Secondary | ICD-10-CM

## 2021-12-18 NOTE — Therapy (Signed)
[place]. [place], [place], [address] Phone: [phone]   Fax:  [phone]  Physical Therapy Treatment  Patient Details  Name: Lynn Moody MRN: [id number] Date of Birth: [birth year] Referring Provider (PT): Ophelia Charter, MD   Encounter Date: 12/18/2021   PT End of Session - 12/18/21 1108     Visit Number 5    Number of Visits 6    Date for PT Re-Evaluation 01/25/22    Authorization Type Medicaid [employer]    Authorization Time Period 12 visits by 09/14/21 then transition to HEP    PT Start Time 1020    PT Stop Time 1059    PT Time Calculation (min) 39 min    Activity Tolerance Patient tolerated treatment well    Behavior During Therapy [facility] for tasks assessed/performed             Past Medical History:  Diagnosis Date   Anemia    Diabetes mellitus without complication ([place])    Hypertension     Past Surgical History:  Procedure Laterality Date   BREAST BIOPSY     left breast with maker placed   CESAREAN SECTION     DENTAL SURGERY     ORIF ELBOW FRACTURE Left 05/11/2021   Procedure: OPEN REDUCTION INTERNAL FIXATION DISTAL HUMERUS FRACTURE;  Surgeon: Hiram Gash, MD;  Location: [place];  Service: Orthopedics;  Laterality: Left;   SHOULDER ARTHROSCOPY WITH DISTAL CLAVICLE RESECTION Left 06/28/2021   Procedure: SHOULDER ARTHROSCOPY WITH DISTAL CLAVICLE RESECTION;  Surgeon: Hiram Gash, MD;  Location: [place];  Service: Orthopedics;  Laterality: Left;   SHOULDER ARTHROSCOPY WITH SUBACROMIAL DECOMPRESSION AND BICEP TENDON REPAIR Left 06/28/2021   Procedure: SHOULDER ARTHROSCOPY WITH SUBACROMIAL DECOMPRESSION; PARTIAL ACROMIOPLASTY WITH CORACOACROMIAL RELEASE AND  BICEP TENDODESIS;  Surgeon: Hiram Gash, MD;  Location: [place];  Service: Orthopedics;  Laterality: Left;   ULNAR NERVE TRANSPOSITION Left 05/11/2021   Procedure: RADIAL  NERVE DECOMPRESSION;   Surgeon: Hiram Gash, MD;  Location: [place];  Service: Orthopedics;  Laterality: Left;    There were no vitals filed for this visit.   Subjective Assessment - 12/18/21 1021     Subjective patient reports less pain with the weather clearing up. She reports no problems after previous treatment where she performed more strengthening activities.    Pertinent History DMII, previous L shoulder accident    Diagnostic tests MRI 06/01/20:  Mild tendinosis with tenosynovitis of biceps, mild AC joint OA    Patient Stated Goals Use L arm more functionally without pain.    Currently in Pain? No/denies                               [facility] Adult PT Treatment/Exercise - 12/18/21 0001       Shoulder Exercises: ROM/Strengthening   UBE (Upper Arm Bike) L1.5 x3 min each way with L UE AAROM      Shoulder Exercises: Stretch   Internal Rotation Stretch 5 reps    Internal Rotation Stretch Limitations with strap    External Rotation Stretch 5 reps   with strap   Other Shoulder Stretches Ball roll out in sit, then added roll out and to R.      Shoulder Exercises: Power [employer] 20 reps    Row Limitations 15# with cables    External  Rotation 10 reps    External Rotation Limitations 5# resistance was very challenging.    Other Power Engineer, water Lat pull downs 1 x 10 reps with 20#.    Other Power Tower Exercises chest press 5# 2 x 10 reps      Vasopneumatic   Number Minutes Vasopneumatic  15 minutes    Vasopnuematic Location  Shoulder    Vasopneumatic Pressure Low    Vasopneumatic Temperature  34                     PT Education - 12/18/21 1055     Education Details STM and stretch for transition to gym after D/C from PT.    Person(s) Educated Patient    Methods Explanation;Demonstration    Comprehension Returned demonstration;Verbalized understanding              PT Short Term Goals - 12/18/21 1028       PT SHORT TERM GOAL #1    Title Pt will be independent with initial HEP.    Baseline updated    Time 3    Period Weeks    Status Achieved    Target Date 12/28/21      PT SHORT TERM GOAL #2   Baseline 43 adjusted               PT Long Term Goals - 12/14/21 1105       PT LONG TERM GOAL #1   Title Pt will be independent with advanced HEP.    Status Partially Met      PT LONG TERM GOAL #2   Title Patient will achieve a score of at least 67 on FOTO to move into stage 4 recovery    Status On-going      PT LONG TERM GOAL #3   Title Patient will increase LUE strength to at least 4+/5 to allow her to perform tasks in her home.    Status On-going      PT LONG TERM GOAL #4   Title Patient will be able to sleep on her L side without increased shoulder pain.    Status On-going      PT LONG TERM GOAL #5   Title Patient will perform all daily activities with L shoulder pain remaining < 4/10.    Status Partially Met                   Plan - 12/18/21 1048     Clinical Impression Statement Patietn reports that she tolerated the strengthening from last week well. Initiated patient education for self mobilization and stretch, then proceeded with strengthening using machines to facilitate transition to the gym after D/C from PT, emphasizing posture and form throughout.    Personal Factors and Comorbidities Fitness    Examination-Activity Limitations Reach Overhead;Dressing;Hygiene/Grooming;Carry;Sleep;Lift    Stability/Clinical Decision Making Stable/Uncomplicated    Clinical Decision Making Low    Rehab Potential Good    PT Frequency 2x / week    PT Duration 3 weeks    PT Treatment/Interventions ADLs/Self Care Home Management;Cryotherapy;Electrical Stimulation;Iontophoresis 25m/ml Dexamethasone;Moist Heat;Ultrasound;Functional mobility training;Therapeutic activities;Therapeutic exercise;Neuromuscular re-education;Patient/family education;Manual techniques;Scar mobilization;Passive range of motion;Dry  needling;Taping;Joint Manipulations;Vasopneumatic Device;Spinal Manipulations    PT Next Visit Plan Complete HEP for transition to local gym.    PT Home Exercise Plan [id number]   Consulted and Agree with Plan of Care Patient             Patient will benefit  from skilled therapeutic intervention in order to improve the following deficits and impairments:  Decreased range of motion, Increased muscle spasms, Impaired UE functional use, Decreased activity tolerance, Pain, Decreased strength, Postural dysfunction, Increased fascial restricitons, Impaired perceived functional ability  Visit Diagnosis: Muscle weakness (generalized)  Stiffness of left shoulder, not elsewhere classified  Abnormal posture  Acute pain of left shoulder  Cramp and spasm     Problem List Patient Active Problem List   Diagnosis Date Noted   History of miscarriage 08/19/2015   Elevated WBC count 05/18/2015   Pneumococcal vaccination declined 03/15/2015   Diabetes mellitus, type 2 ([place]) 12/23/2014   IMPAIRED GLUCOSE TOLERANCE TEST 10/13/2010    Marcelina Morel, DPT 12/18/2021, 11:20 AM  [place]. [place], [place], [address] Phone: [phone]   Fax:  [phone]  Name: Lynn Moody MRN: [id number] Date of Birth: October 07, 1971

## 2021-12-21 ENCOUNTER — Ambulatory Visit: Payer: Medicaid Other | Admitting: Physical Therapy

## 2021-12-25 ENCOUNTER — Other Ambulatory Visit: Payer: Self-pay

## 2021-12-25 ENCOUNTER — Encounter: Payer: Self-pay | Admitting: Physical Therapy

## 2021-12-25 ENCOUNTER — Ambulatory Visit: Payer: Medicaid Other | Admitting: Physical Therapy

## 2021-12-25 DIAGNOSIS — R600 Localized edema: Secondary | ICD-10-CM

## 2021-12-25 DIAGNOSIS — M25612 Stiffness of left shoulder, not elsewhere classified: Secondary | ICD-10-CM

## 2021-12-25 DIAGNOSIS — M25622 Stiffness of left elbow, not elsewhere classified: Secondary | ICD-10-CM

## 2021-12-25 DIAGNOSIS — M25512 Pain in left shoulder: Secondary | ICD-10-CM

## 2021-12-25 DIAGNOSIS — R293 Abnormal posture: Secondary | ICD-10-CM

## 2021-12-25 DIAGNOSIS — M6281 Muscle weakness (generalized): Secondary | ICD-10-CM

## 2021-12-25 DIAGNOSIS — R252 Cramp and spasm: Secondary | ICD-10-CM

## 2021-12-25 NOTE — Therapy (Signed)
[place]. [place], [place], [address] Phone: [phone]   Fax:  [phone]  Physical Therapy Treatment PHYSICAL THERAPY DISCHARGE SUMMARY  Visits from Start of Care: 6  Current functional level related to goals / functional outcomes: Patient did not achieve all goals due to insurance limited the number of visits, which was insufficient to fully address all issues.   Remaining deficits: See objective findings.   Education / Equipment: HEP   Patient agrees to discharge. Patient goals were partially met. Patient is being discharged due to financial reasons.  Patient Details  Name: Lynn Moody MRN: [id number] Date of Birth: 04-Nov-1971 Referring Provider (PT): Ophelia Charter, MD   Encounter Date: 12/25/2021   PT End of Session - 12/25/21 1134     Visit Number 6    Number of Visits 6    Date for PT Re-Evaluation 01/25/22    Authorization Type Medicaid [employer]    Authorization Time Period 12 visits by 09/14/21 then transition to HEP    PT Start Time 1105    PT Stop Time 1146    PT Time Calculation (min) 41 min    Activity Tolerance Patient tolerated treatment well    Behavior During Therapy [facility] for tasks assessed/performed             Past Medical History:  Diagnosis Date   Anemia    Diabetes mellitus without complication ([place])    Hypertension     Past Surgical History:  Procedure Laterality Date   BREAST BIOPSY     left breast with maker placed   CESAREAN SECTION     DENTAL SURGERY     ORIF ELBOW FRACTURE Left 05/11/2021   Procedure: OPEN REDUCTION INTERNAL FIXATION DISTAL HUMERUS FRACTURE;  Surgeon: Hiram Gash, MD;  Location: [place];  Service: Orthopedics;  Laterality: Left;   SHOULDER ARTHROSCOPY WITH DISTAL CLAVICLE RESECTION Left 06/28/2021   Procedure: SHOULDER ARTHROSCOPY WITH DISTAL CLAVICLE RESECTION;  Surgeon: Hiram Gash, MD;  Location: [place];   Service: Orthopedics;  Laterality: Left;   SHOULDER ARTHROSCOPY WITH SUBACROMIAL DECOMPRESSION AND BICEP TENDON REPAIR Left 06/28/2021   Procedure: SHOULDER ARTHROSCOPY WITH SUBACROMIAL DECOMPRESSION; PARTIAL ACROMIOPLASTY WITH CORACOACROMIAL RELEASE AND  BICEP TENDODESIS;  Surgeon: Hiram Gash, MD;  Location: [place];  Service: Orthopedics;  Laterality: Left;   ULNAR NERVE TRANSPOSITION Left 05/11/2021   Procedure: RADIAL  NERVE DECOMPRESSION;  Surgeon: Hiram Gash, MD;  Location: [place];  Service: Orthopedics;  Laterality: Left;    There were no vitals filed for this visit.   Subjective Assessment - 12/25/21 1109     Subjective Patient reports she has been performing HEP stretches and feels she is improving. Tolerated the exercises from last treatment well.    Pertinent History DMII, previous L shoulder accident    Currently in Pain? Yes    Pain Score 4     Pain Location Shoulder    Pain Orientation Left    Pain Descriptors / Indicators Radiating;Aching;Sharp    Pain Type Chronic pain    Pain Onset More than a month ago    Pain Frequency Intermittent    Aggravating Factors  Weather    Pain Relieving Factors Tylenol    Multiple Pain Sites No  [facility] Adult PT Treatment/Exercise - 12/25/21 0001       Shoulder Exercises: ROM/Strengthening   UBE (Upper Arm Bike) L1.5 x3 min each way with L UE AAROM      Shoulder Exercises: Power Immunologist Limitations 10    Row 20 reps    Row Limitations 10# with cables    External Rotation 10 reps    External Rotation Limitations 5# resistance was very challenging.      Vasopneumatic   Number Minutes Vasopneumatic  15 minutes    Vasopnuematic Location  Shoulder    Vasopneumatic Pressure Low    Vasopneumatic Temperature  34      Manual Therapy   Manual Therapy Soft tissue mobilization;Passive ROM    Soft tissue mobilization  cross friction to supraspinatus tendon, biceps tendon 1 minutes each    Passive ROM L shoulder PROM flexion, abduciton 2 x 15 seconds                     PT Education - 12/25/21 1134     Education Details Educated to self performed STM and stretch for L shoulder. Patient reprots she is comfortable with HEP.    Person(s) Educated Patient    Methods Explanation;Demonstration    Comprehension Returned demonstration;Verbalized understanding              PT Short Term Goals - 12/18/21 1028       PT SHORT TERM GOAL #1   Title Pt will be independent with initial HEP.    Baseline updated    Time 3    Period Weeks    Status Achieved    Target Date 12/28/21      PT SHORT TERM GOAL #2   Baseline 43 adjusted               PT Long Term Goals - 12/25/21 1143       PT LONG TERM GOAL #1   Title Pt will be independent with advanced HEP.    Status Achieved      PT LONG TERM GOAL #2   Title Patient will achieve a score of at least 67 on FOTO to move into stage 4 recovery    Baseline 52    Status Not Met      PT LONG TERM GOAL #3   Title Patient will increase LUE strength to at least 4+/5 to allow her to perform tasks in her home.    Baseline 3-(3+)/5    Status Not Met      PT LONG TERM GOAL #4   Title Patient will be able to sleep on her L side without increased shoulder pain.    Status Not Met      PT LONG TERM GOAL #5   Title Patient will perform all daily activities with L shoulder pain remaining < 4/10.    Baseline 6-7/10    Status Not Met                   Plan - 12/25/21 1112     Clinical Impression Statement Patient reports she is progressing well with her stretches and strenghtening, but feels the STM and mobilizations are important in therapy as she cannot get quite as much stretch on her own. She is comfortable with her HEP and feels she can transition to gym. Perofrmed manul therapy to ROM today F/B strength and then vasopneumatic  machine for pain  control. She did not meet her LTG due to her treatment visists were limited by insurance without the ability to pay out of pocket costs for additional treatment.    Personal Factors and Comorbidities Fitness    Examination-Activity Limitations Reach Overhead;Dressing;Hygiene/Grooming;Carry;Sleep;Lift    Stability/Clinical Decision Making Stable/Uncomplicated    Clinical Decision Making Low    Rehab Potential Good    PT Frequency 2x / week    PT Duration 2 weeks    PT Treatment/Interventions ADLs/Self Care Home Management;Cryotherapy;Electrical Stimulation;Iontophoresis 76m/ml Dexamethasone;Moist Heat;Ultrasound;Functional mobility training;Therapeutic activities;Therapeutic exercise;Neuromuscular re-education;Patient/family education;Manual techniques;Scar mobilization;Passive range of motion;Dry needling;Taping;Joint Manipulations;Vasopneumatic Device;Spinal Manipulations    PT Next Visit Plan Complete HEP for transition to local gym.    PT Home Exercise Plan [phone]   Consulted and Agree with Plan of Care Patient             Patient will benefit from skilled therapeutic intervention in order to improve the following deficits and impairments:  Decreased range of motion, Increased muscle spasms, Impaired UE functional use, Decreased activity tolerance, Pain, Decreased strength, Postural dysfunction, Increased fascial restricitons, Impaired perceived functional ability  Visit Diagnosis: Muscle weakness (generalized)  Stiffness of left shoulder, not elsewhere classified  Abnormal posture  Acute pain of left shoulder  Cramp and spasm  Stiffness of left elbow, not elsewhere classified  Localized edema     Problem List Patient Active Problem List   Diagnosis Date Noted   History of miscarriage 08/19/2015   Elevated WBC count 05/18/2015   Pneumococcal vaccination declined 03/15/2015   Diabetes mellitus, type 2 ([place] 12/23/2014   IMPAIRED GLUCOSE TOLERANCE  TEST 10/13/2010    SMarcelina Morel DPT 12/25/2021, 3:03 PM  [place] [id number]: [phone]  Fax:  [phone] Name: Lynn MULLANMRN: [id number] of Birth: 8June 27, 1972

## 2022-01-01 ENCOUNTER — Ambulatory Visit: Payer: Medicaid Other | Admitting: Physical Therapy

## 2022-01-08 ENCOUNTER — Ambulatory Visit: Payer: Medicaid Other | Admitting: Physical Therapy

## 2022-11-04 ENCOUNTER — Other Ambulatory Visit: Payer: Self-pay

## 2022-11-04 ENCOUNTER — Emergency Department (HOSPITAL_BASED_OUTPATIENT_CLINIC_OR_DEPARTMENT_OTHER)
Admission: EM | Admit: 2022-11-04 | Discharge: 2022-11-04 | Disposition: A | Discharge status: Home / Self Care | Payer: Medicaid Other | Attending: Emergency Medicine | Admitting: Emergency Medicine

## 2022-11-04 ENCOUNTER — Encounter (HOSPITAL_BASED_OUTPATIENT_CLINIC_OR_DEPARTMENT_OTHER): Payer: Self-pay | Admitting: Emergency Medicine

## 2022-11-04 ENCOUNTER — Emergency Department (HOSPITAL_BASED_OUTPATIENT_CLINIC_OR_DEPARTMENT_OTHER): Payer: Medicaid Other

## 2022-11-04 DIAGNOSIS — E119 Type 2 diabetes mellitus without complications: Secondary | ICD-10-CM | POA: Insufficient documentation

## 2022-11-04 DIAGNOSIS — I10 Essential (primary) hypertension: Secondary | ICD-10-CM | POA: Diagnosis not present

## 2022-11-04 DIAGNOSIS — Z7984 Long term (current) use of oral hypoglycemic drugs: Secondary | ICD-10-CM | POA: Insufficient documentation

## 2022-11-04 DIAGNOSIS — Z79899 Other long term (current) drug therapy: Secondary | ICD-10-CM | POA: Diagnosis not present

## 2022-11-04 DIAGNOSIS — R030 Elevated blood-pressure reading, without diagnosis of hypertension: Secondary | ICD-10-CM

## 2022-11-04 DIAGNOSIS — R002 Palpitations: Secondary | ICD-10-CM | POA: Diagnosis present

## 2022-11-04 LAB — CBC
HCT: 37.9 % (ref 36.0–46.0)
Hemoglobin: 12.3 g/dL (ref 12.0–15.0)
MCH: 25.2 pg — ABNORMAL LOW (ref 26.0–34.0)
MCHC: 32.5 g/dL (ref 30.0–36.0)
MCV: 77.5 fL — ABNORMAL LOW (ref 80.0–100.0)
Platelets: 459 10*3/uL — ABNORMAL HIGH (ref 150–400)
RBC: 4.89 MIL/uL (ref 3.87–5.11)
RDW: 14.2 % (ref 11.5–15.5)
WBC: 15.4 10*3/uL — ABNORMAL HIGH (ref 4.0–10.5)
nRBC: 0 % (ref 0.0–0.2)

## 2022-11-04 LAB — BASIC METABOLIC PANEL
Anion gap: 9 (ref 5–15)
BUN: 8 mg/dL (ref 6–20)
CO2: 27 mmol/L (ref 22–32)
Calcium: 8.7 mg/dL — ABNORMAL LOW (ref 8.9–10.3)
Chloride: 98 mmol/L (ref 98–111)
Creatinine, Ser: 0.69 mg/dL (ref 0.44–1.00)
GFR, Estimated: >60 mL/min (ref >60)
Glucose, Bld: 280 mg/dL — ABNORMAL HIGH (ref 70–99)
Potassium: 3.9 mmol/L (ref 3.5–5.1)
Sodium: 134 mmol/L — ABNORMAL LOW (ref 135–145)

## 2022-11-04 LAB — TROPONIN I (HIGH SENSITIVITY): Troponin I (High Sensitivity): 3 ng/L (ref <18)

## 2022-11-04 NOTE — Discharge Instructions (Signed)
Follow up with cardiology as referred by urgent care earlier. Monitor your blood pressure. Record readings and take to your PCP for follow up.

## 2022-11-04 NOTE — ED Provider Notes (Signed)
[facility] EMERGENCY DEPARTMENT Provider Note   CSN: [id number] Arrival date & time: 11/04/22  2021     History  Chief Complaint  Patient presents with   Abnormal Lab    Lynn Moody is a 51 y.o. female.  51 year old female with past medical history of hypertension, diabetes, anemia presents with report of palpitations or fluttering sensation in her chest.  Reports last having an episode 2 days ago, lasting 15 minutes, resolved without intervention, not associated with shortness of breath, diaphoresis, nausea or chest pain.  Patient went to urgent care today for same symptoms, had labs drawn and was told that her troponin was high and she needed to come to the emergency room.  Patient not currently on medication for her blood pressure, states that she used to take medication, her blood pressure improved, she discontinued the medications.  No significant family history.  Is a non-smoker.  No other complaints or concerns today.       Home Medications Prior to Admission medications   Medication Sig Start Date End Date Taking? Authorizing Provider  amLODipine-benazepril (LOTREL) 5-20 MG capsule Take 1 capsule by mouth daily.    [provider]  Dulaglutide (TRULICITY) 1.5 XB/1.4NW SOPN Inject into the skin.    [provider]  ferrous sulfate 324 MG TBEC Take 324 mg by mouth.    [provider]  gabapentin (NEURONTIN) 100 MG capsule Take 1 capsule (100 mg total) by mouth 3 (three) times daily. For pain 05/11/21   McBane, Maylene Roes, PA-C  glimepiride (AMARYL) 4 MG tablet Take 4 mg by mouth daily with breakfast.    [provider]  methocarbamol (ROBAXIN) 500 MG tablet Take 500 mg by mouth every 8 (eight) hours as needed for muscle spasms.    [provider]  pioglitazone (ACTOS) 45 MG tablet Take 45 mg by mouth daily.    [provider]      Allergies    Patient has no known allergies.    Review of Systems   Review  of Systems Negative except as per HPI Physical Exam Updated Vital Signs BP (!) 173/96 (BP Location: Right Arm)   Pulse 86   Temp 99.1 F (37.3 C) (Oral)   Resp 16   Ht '5\' 4"'$  (1.626 m)   Wt 93 kg   LMP 10/23/2022   SpO2 98%   BMI 35.19 kg/m  Physical Exam Vitals and nursing note reviewed.  Constitutional:      General: She is not in acute distress.    Appearance: She is well-developed. She is not diaphoretic.  HENT:     Head: Normocephalic and atraumatic.  Eyes:     Extraocular Movements: Extraocular movements intact.     Pupils: Pupils are equal, round, and reactive to light.  Cardiovascular:     Rate and Rhythm: Normal rate and regular rhythm.     Heart sounds: Normal heart sounds.  Pulmonary:     Effort: Pulmonary effort is normal.     Breath sounds: Normal breath sounds.  Abdominal:     Palpations: Abdomen is soft.     Tenderness: There is no abdominal tenderness.  Musculoskeletal:     Cervical back: Neck supple.     Right lower leg: No edema.     Left lower leg: No edema.  Skin:    General: Skin is warm and dry.  Neurological:     Mental Status: She is alert and oriented to person, place, and time.  Coordination: Coordination normal.  Psychiatric:        Behavior: Behavior normal.     ED Results / Procedures / Treatments   Labs (all labs ordered are listed, but only abnormal results are displayed) Labs Reviewed  BASIC METABOLIC PANEL - Abnormal; Notable for the following components:      Result Value   Sodium 134 (*)    Glucose, Bld 280 (*)    Calcium 8.7 (*)    All other components within normal limits  CBC - Abnormal; Notable for the following components:   WBC 15.4 (*)    MCV 77.5 (*)    MCH 25.2 (*)    Platelets 459 (*)    All other components within normal limits  TROPONIN I (HIGH SENSITIVITY)    EKG None  Radiology DG Chest Port 1 View  Result Date: 11/04/2022 CLINICAL DATA:  Elevated troponin, chest pain, palpitations EXAM:  PORTABLE CHEST 1 VIEW COMPARISON:  01/14/2019 FINDINGS: Single frontal view of the chest demonstrates a stable cardiac silhouette. There is continued ectasia of the thoracic aorta. No airspace disease, effusion, or pneumothorax. No acute bony abnormalities. IMPRESSION: 1. Stable chest, no acute process. Electronically Signed   By: Randa Ngo M.D.   On: 11/04/2022 21:14    Procedures Procedures    Medications Ordered in ED Medications - No data to display  ED Course/ Medical Decision Making/ A&P                           Medical Decision Making Amount and/or Complexity of Data Reviewed Labs: ordered. Radiology: ordered.   This patient presents to the ED for concern of elevated troponin at UC, hx palpitations, this involves an extensive number of treatment options, and is a complaint that carries with it a high risk of complications and morbidity.  The differential diagnosis includes but not limited to NSTEMI, ACS, arrhythmia    Co morbidities that complicate the patient evaluation  Hypertension, diabetes   Additional history obtained:  External records from outside source obtained and reviewed including visit to PCP office dated 10/11/2022 for hypertension, hyperlipidemia follow-up.  Blood pressure recorded as 134/80 with note that patient is x-ray has been regularly working on her diet, goal to improve blood pressure to 120/80.  Prior blood pressures reviewed including September 26, 2022 of 138/86 as well as August 30, 2022 138/96. Visit to urgent care today for palpitations and headache, labs drawn, called with elevated troponin of 30, TSH WNL, A1c 9.0   Lab Tests:  I Ordered, and personally interpreted labs.  The pertinent results include: BMP with elevated glucose at 280.  CBC with nonspecific leukocytosis with white count of 15.4.  Troponin is 3   Imaging Studies ordered:  I ordered imaging studies including chest x-ray I independently visualized and interpreted imaging  which showed no acute process I agree with the radiologist interpretation   Cardiac Monitoring: / EKG:  The patient was maintained on a cardiac monitor.  I personally viewed and interpreted the cardiac monitored which showed an underlying rhythm of: Sinus rhythm, rate 91   Consultations Obtained:  I requested consultation with the Dr. Truett Mainland,  and discussed lab and imaging findings as well as pertinent plan - they recommend: dc after normal troponin    Problem List / ED Course / Critical interventions / Medication management  51 year old female presents with concern for elevated troponin at [facility] today. Patient went to [facility] for  headaches and palpitations, found to have troponin elevated at 30 and was sent to the ER. Arrives without any complaints of CP, states she last had palpitations 2 days ago. No history of CKD. BP elevated, review of recent PCP note with [facility] on file, normally 130s/80s-90s. Discussed reassuring work up with patient today. BP elevated, plan is to monitor at home, record results and follow up with PCP.  I have reviewed the patients home medicines and have made adjustments as needed   Social Determinants of Health:  Has PCP for follow up   Test / Admission - Considered:  Troponin normal, EKG without ischemic changes. Stable for dc to follow up with PCP         Final Clinical Impression(s) / ED Diagnoses Final diagnoses:  Palpitations  Elevated blood pressure reading    Rx / DC Orders ED Discharge Orders     None         Roque Lias 11/04/22 2209    Cristie Hem, MD 11/04/22 2257

## 2022-11-04 NOTE — ED Triage Notes (Signed)
Pt sts she was seen at [facility] today and was called back later re: elevated troponin; has been palpitations and fluttering sensation for about 2 wks

## 2022-11-21 ENCOUNTER — Ambulatory Visit: Payer: Medicaid Other | Admitting: Cardiology

## 2022-11-21 ENCOUNTER — Encounter: Payer: Self-pay | Admitting: Cardiology

## 2022-11-21 VITALS — BP 135/88 | HR 84 | Resp 16 | Ht 64.0 in | Wt 208.6 lb

## 2022-11-21 DIAGNOSIS — R002 Palpitations: Secondary | ICD-10-CM

## 2022-11-21 DIAGNOSIS — R7989 Other specified abnormal findings of blood chemistry: Secondary | ICD-10-CM

## 2022-11-21 DIAGNOSIS — E781 Pure hyperglyceridemia: Secondary | ICD-10-CM

## 2022-11-21 DIAGNOSIS — I10 Essential (primary) hypertension: Secondary | ICD-10-CM

## 2022-11-21 DIAGNOSIS — E1165 Type 2 diabetes mellitus with hyperglycemia: Secondary | ICD-10-CM

## 2022-11-21 NOTE — Progress Notes (Signed)
ID:  Lynn Moody, DOB 11/25/1971, MRN [id number]  PCP:  Bartholome Bill, MD  Cardiologist:  Rex Kras, DO, [facility] (established care 11/21/2022)  REASON FOR CONSULT: Palpitations  REQUESTING PHYSICIAN:  Bartholome Bill, MD [place],  [place] [id number]  Chief Complaint  Patient presents with   Palpitations   New Patient (Initial Visit)    HPI  Lynn Moody is a 51 y.o. African-American female who presents to the clinic for evaluation of palpitations at the request of Bartholome Bill, MD. Her past medical history and cardiovascular risk factors include: Hypertension, non-insulin-dependent diabetes, hyperlipidemia, obesity due to excess calories.   Palpitations: Patient states that symptoms have been ongoing for the last 1 month, occurs couple times a week, lasting for 15 minutes, no improving or worsening factors, and self-limited.  Recently she has been more stressed than usual which may be contributory.  No known thyroid disease and hemoglobin is well-controlled. Consumes 2 Venti sized coffee per day, 1 can of soda, no illicits or alcohol use, no weight loss or stimulants.  No associated near-syncope or syncope.  Earlier in December 2023 she went to urgent care predominantly for headaches.  She was noted to have a blood pressure of 190/100 according to her.  Labs were performed at urgent care.  Majority of the labs were within acceptable limits with the exception of troponin I which was above the normal limit.  She was referred and was going ED for further evaluation.  Her home blood pressures still are not well-controlled with SBP around 140-160 mmHg.  Over the last 1 year she has stopped taking her medications because her numbers were "good."   FUNCTIONAL STATUS: No structured exercise program or daily routine.   ALLERGIES: No Known Allergies  MEDICATION LIST PRIOR TO VISIT: Current Meds  Medication Sig    amLODipine-benazepril (LOTREL) 5-20 MG capsule Take 1 capsule by mouth daily.   diclofenac Sodium (VOLTAREN) 1 % GEL Apply 2 g topically 4 (four) times daily.   Dulaglutide (TRULICITY) 1.5 DG/6.4QI SOPN Inject into the skin.   ferrous sulfate 324 MG TBEC Take 324 mg by mouth.   ibuprofen (ADVIL) 800 MG tablet Take 800 mg by mouth every 6 (six) hours as needed.   traMADol (ULTRAM) 50 MG tablet Take 50 mg by mouth every 6 (six) hours as needed for moderate pain or severe pain.     PAST MEDICAL HISTORY: Past Medical History:  Diagnosis Date   Anemia    Diabetes mellitus without complication ([place])    Hypertension     PAST SURGICAL HISTORY: Past Surgical History:  Procedure Laterality Date   BREAST BIOPSY     left breast with maker placed   CESAREAN SECTION     DENTAL SURGERY     ORIF ELBOW FRACTURE Left 05/11/2021   Procedure: OPEN REDUCTION INTERNAL FIXATION DISTAL HUMERUS FRACTURE;  Surgeon: Hiram Gash, MD;  Location: [place];  Service: Orthopedics;  Laterality: Left;   SHOULDER ARTHROSCOPY WITH DISTAL CLAVICLE RESECTION Left 06/28/2021   Procedure: SHOULDER ARTHROSCOPY WITH DISTAL CLAVICLE RESECTION;  Surgeon: Hiram Gash, MD;  Location: [place];  Service: Orthopedics;  Laterality: Left;   SHOULDER ARTHROSCOPY WITH SUBACROMIAL DECOMPRESSION AND BICEP TENDON REPAIR Left 06/28/2021   Procedure: SHOULDER ARTHROSCOPY WITH SUBACROMIAL DECOMPRESSION; PARTIAL ACROMIOPLASTY WITH CORACOACROMIAL RELEASE AND  BICEP TENDODESIS;  Surgeon: Hiram Gash, MD;  Location: [place];  Service: Orthopedics;  Laterality: Left;   ULNAR NERVE TRANSPOSITION Left 05/11/2021   Procedure: RADIAL  NERVE DECOMPRESSION;  Surgeon: Hiram Gash, MD;  Location: [place];  Service: Orthopedics;  Laterality: Left;    FAMILY HISTORY: The patient family history includes Diabetes in her mother and paternal grandmother; Glaucoma in her father and  mother; Hypertension in her paternal grandmother; Prostate cancer in her father.  SOCIAL HISTORY:  The patient  reports that she has never smoked. She has never used smokeless tobacco. She reports that she does not drink alcohol and does not use drugs.  REVIEW OF SYSTEMS: Review of Systems  Cardiovascular:  Positive for palpitations (Less frequent). Negative for chest pain, claudication, dyspnea on exertion, irregular heartbeat, leg swelling, near-syncope, orthopnea, paroxysmal nocturnal dyspnea and syncope.  Respiratory:  Negative for shortness of breath.   Hematologic/Lymphatic: Negative for bleeding problem.  Musculoskeletal:  Negative for muscle cramps and myalgias.  Neurological:  Positive for headaches (better). Negative for dizziness and light-headedness.    PHYSICAL EXAM:    11/21/2022    9:30 AM 11/04/2022   10:00 PM 11/04/2022    9:30 PM  Vitals with BMI  Height _0     Weight 208 lbs 10 oz    BMI 70.35    Systolic 009 381 829  Diastolic 88 96 91  Pulse 84 86 85    Physical Exam  Constitutional: No distress.  Age appropriate, hemodynamically stable.   Neck: No JVD present.  Cardiovascular: Normal rate, regular rhythm, S1 normal, S2 normal, intact distal pulses and normal pulses. Exam reveals no gallop, no S3 and no S4.  No murmur heard. Pulmonary/Chest: Effort normal and breath sounds normal. No stridor. She has no wheezes. She has no rales.  Abdominal: Soft. Bowel sounds are normal. She exhibits no distension. There is no abdominal tenderness.  Musculoskeletal:        General: No edema.     Cervical back: Neck supple.  Neurological: She is alert and oriented to person, place, and time. She has intact cranial nerves (2-12).  Skin: Skin is warm and moist.     CARDIAC DATABASE: EKG: 11/21/2022: Normal sinus rhythm, 88 bpm, normal axis, without underlying ischemia or injury pattern.  Echocardiogram: No results found for this or any previous visit from the  past 1095 days.    Stress Testing: NA.  Heart Catheterization: February 28, 2028 [facility] Dr. Philbert Riser. No epicardial coronary artery disease per report  LABORATORY DATA:    Latest Ref Rng & Units 11/04/2022    8:50 PM 08/03/2015    7:31 PM 07/25/2015   12:44 PM  CBC  WBC 4.0 - 10.5 K/uL 15.4  13.1  11.8   Hemoglobin 12.0 - 15.0 g/dL 12.3  11.1  10.9   Hematocrit 36.0 - 46.0 % 37.9  33.7  33.8   Platelets 150 - 400 K/uL 459  357  351        Latest Ref Rng & Units 11/04/2022    8:50 PM 06/28/2021   10:00 AM 05/09/2021    2:30 PM  CMP  Glucose 70 - 99 mg/dL 280  117  94   BUN 6 - 20 mg/dL _1 Creatinine 0.44 - 1.00 mg/dL 0.69  0.62  0.51   Sodium 135 - 145 mmol/L 134  139  136   Potassium 3.5 - 5.1 mmol/L 3.9  3.6  3.9   Chloride 98 - 111 mmol/L  98  104  102   CO2 22 - 32 mmol/L _0 Calcium 8.9 - 10.3 mg/dL 8.7  8.7  9.1     External Labs: Collected: 11/04/2022 [facility]. WBC 13.9, hemoglobin 12.6, hematocrit 38.7, platelets 423 Hemoglobin A1c 9. TSH 0.79 Troponin I 30 Sodium 136, potassium 3.9, chloride 98, bicarb 31, BUN 7, creatinine 0.68 eGFR >90  Collected: 06/04/2022. Total cholesterol 132, triglyceride 173, HDL 35, LDL 62 non-HDL 97  IMPRESSION:    ICD-10-CM   1. Palpitations  R00.2 EKG 12-Lead    LONG TERM MONITOR (3-14 DAYS)    2. Elevated troponin  R79.89 PCV ECHOCARDIOGRAM COMPLETE    3. Benign hypertension  I10 PCV ECHOCARDIOGRAM COMPLETE    4. Type 2 diabetes mellitus with hyperglycemia, without long-term current use of insulin (HCC)  E11.65 PCV ECHOCARDIOGRAM COMPLETE    5. Hypertriglyceridemia  E78.1        RECOMMENDATIONS: Lynn Moody is a 51 y.o. African-American female whose past medical history and cardiac risk factors include: Hypertension, non-insulin-dependent diabetes, hyperlipidemia, obesity due to excess calories.   Palpitations No  identifiable reversible causes. Outside labs independently reviewed-hemoglobin and TSH within normal limits. EKG: Nonischemic. Likely secondary to underlying tachycardia due to elevated blood pressures. Will proceed with a 2-week monitor to evaluate for dysrhythmias.  Elevated troponin Likely due to supply demand ischemia elevated blood pressures and tachycardia/palpitations. Cannot rule out infectious cause as her white count was also elevated.  Repeat troponins within normal limits at [facility], ED. Given her diabetes, hypertension, hyper triglyceridemia would recommend ischemic workup once her blood pressures are well-controlled. She is not having any active chest pain and EKG not concerning for underlying ischemia/injury pattern.. Proceed with an echocardiogram for now to evaluate for LVEF and structural heart disease  Benign hypertension Office blood pressures within acceptable limits. Home blood pressures are not well-controlled between 140-160 mmHg. Diet is high in canned foods such as soups and tuna -patient is educated on the importance of reducing/stopping canned foods. Would recommend a goal SBP of 130 mmHg consistently. Currently managed by primary care provider.  Type 2 diabetes mellitus with hyperglycemia, without long-term current use of insulin (HCC) A1c not well-controlled. Currently on ACE inhibitors.  Recommend statin therapy/Jard[place]-will defer to PCP  Hypertriglyceridemia Not well-controlled as of July 2023. Reemphasized the importance of dietary restriction. Requested that she have it rechecked with PCP-these numbers have likely trended up due to poor glycemic control.   Data Reviewed: I have independently reviewed external notes urgent care, [facility] emergency room department provided by the referring provider as part of this office visit.   I have independently reviewed results of EKG, prior stress test report, prior heart catheterization report as part  of medical decision making. I have ordered the following tests:  Orders Placed This Encounter  Procedures   LONG TERM MONITOR (3-14 DAYS)    Standing Status:   Future    Number of Occurrences:   1    Order Specific Question:   Where should this test be performed?    Answer:   [facility]    Order Specific Question:   Does the patient have an implanted cardiac device?    Answer:   No    Order Specific Question:   Prescribed days of wear    Answer:   11    Order Specific Question:   Type of enrollment    Answer:  Home Enrollment    Order Specific Question:   Vendor:    Answer:   [employer]   EKG 12-Lead   [facility] ECHOCARDIOGRAM COMPLETE    Standing Status:   Future    Standing Expiration Date:   11/22/2023  I have made no medications changes at today's encounter as noted above. History of present illness was obtained by both patient and husband at today's office visit.   FINAL MEDICATION LIST END OF ENCOUNTER: No orders of the defined types were placed in this encounter.   Medications Discontinued During This Encounter  Medication Reason   gabapentin (NEURONTIN) 100 MG capsule Patient Preference   methocarbamol (ROBAXIN) 500 MG tablet No longer needed (for PRN medications)   glimepiride (AMARYL) 4 MG tablet Patient Preference   pioglitazone (ACTOS) 45 MG tablet Patient Preference     Current Outpatient Medications:    amLODipine-benazepril (LOTREL) 5-20 MG capsule, Take 1 capsule by mouth daily., Disp: , Rfl:    diclofenac Sodium (VOLTAREN) 1 % GEL, Apply 2 g topically 4 (four) times daily., Disp: , Rfl:    Dulaglutide (TRULICITY) 1.5 HD/6.2IW SOPN, Inject into the skin., Disp: , Rfl:    ferrous sulfate 324 MG TBEC, Take 324 mg by mouth., Disp: , Rfl:    ibuprofen (ADVIL) 800 MG tablet, Take 800 mg by mouth every 6 (six) hours as needed., Disp: , Rfl:    traMADol (ULTRAM) 50 MG tablet, Take 50 mg by mouth every 6 (six) hours as needed for moderate pain or severe pain., Disp: ,  Rfl:    Continuous Blood Gluc Sensor (DEXCOM G6 SENSOR) MISC, SMARTSIG:1 Topical Every 10 Days, Disp: , Rfl:    Continuous Blood Gluc Transmit (DEXCOM G6 TRANSMITTER) MISC, USE AS DIRECTED FOR CONTINUOUS GLUCOSE MONITORING, REUSE FOR 90 DAYS THEN REPLACE, Disp: , Rfl:    metFORMIN (GLUCOPHAGE-XR) 500 MG 24 hr tablet, Take 1,000 mg by mouth 2 (two) times daily., Disp: , Rfl:   Orders Placed This Encounter  Procedures   LONG TERM MONITOR (3-14 DAYS)   EKG 12-Lead   PCV ECHOCARDIOGRAM COMPLETE    There are no Patient Instructions on file for this visit.   --Continue cardiac medications as reconciled in final medication list. --Return in about 6 weeks (around 01/02/2023) for Follow up, Palpitations, elevated troponins.. or sooner if needed. --Continue follow-up with your primary care physician regarding the management of your other chronic comorbid conditions.  Patient's questions and concerns were addressed to her satisfaction. She voices understanding of the instructions provided during this encounter.   This note was created using a voice recognition software as a result there may be grammatical errors inadvertently enclosed that do not reflect the nature of this encounter. Every attempt is made to correct such errors.  Rex Kras, [place], [facility]  Pager: [phone] Office: [phone]

## 2022-12-05 ENCOUNTER — Ambulatory Visit: Payer: Medicaid Other

## 2022-12-05 DIAGNOSIS — I10 Essential (primary) hypertension: Secondary | ICD-10-CM

## 2022-12-05 DIAGNOSIS — E1165 Type 2 diabetes mellitus with hyperglycemia: Secondary | ICD-10-CM

## 2022-12-05 DIAGNOSIS — R7989 Other specified abnormal findings of blood chemistry: Secondary | ICD-10-CM

## 2022-12-05 DIAGNOSIS — R002 Palpitations: Secondary | ICD-10-CM

## 2022-12-24 NOTE — Therapy (Signed)
OUTPATIENT PHYSICAL THERAPY SHOULDER EVALUATION   Patient Name: Lynn Moody MRN: [id number] DOB:01-18-1971, 52 y.o., female Today's Date: 12/25/2022  END OF SESSION:  PT End of Session - 12/25/22 0932     Visit Number 1    Date for PT Re-Evaluation 02/26/23    Authorization Type [employer]    PT Start Time 0930    PT Stop Time 1015    PT Time Calculation (min) 45 min    Activity Tolerance Patient tolerated treatment well    Behavior During Therapy WFL for tasks assessed/performed             Past Medical History:  Diagnosis Date   Anemia    Diabetes mellitus without complication ([place])    Hypertension    Past Surgical History:  Procedure Laterality Date   BREAST BIOPSY     left breast with maker placed   CESAREAN SECTION     DENTAL SURGERY     ORIF ELBOW FRACTURE Left 05/11/2021   Procedure: OPEN REDUCTION INTERNAL FIXATION DISTAL HUMERUS FRACTURE;  Surgeon: Hiram Gash, MD;  Location: [place];  Service: Orthopedics;  Laterality: Left;   SHOULDER ARTHROSCOPY WITH DISTAL CLAVICLE RESECTION Left 06/28/2021   Procedure: SHOULDER ARTHROSCOPY WITH DISTAL CLAVICLE RESECTION;  Surgeon: Hiram Gash, MD;  Location: [place];  Service: Orthopedics;  Laterality: Left;   SHOULDER ARTHROSCOPY WITH SUBACROMIAL DECOMPRESSION AND BICEP TENDON REPAIR Left 06/28/2021   Procedure: SHOULDER ARTHROSCOPY WITH SUBACROMIAL DECOMPRESSION; PARTIAL ACROMIOPLASTY WITH CORACOACROMIAL RELEASE AND  BICEP TENDODESIS;  Surgeon: Hiram Gash, MD;  Location: [place];  Service: Orthopedics;  Laterality: Left;   ULNAR NERVE TRANSPOSITION Left 05/11/2021   Procedure: RADIAL  NERVE DECOMPRESSION;  Surgeon: Hiram Gash, MD;  Location: [place];  Service: Orthopedics;  Laterality: Left;   Patient Active Problem List   Diagnosis Date Noted   History of miscarriage 08/19/2015   Elevated WBC count 05/18/2015   Pneumococcal vaccination  declined 03/15/2015   Diabetes mellitus, type 2 ([place]) 12/23/2014   IMPAIRED GLUCOSE TOLERANCE TEST 10/13/2010    PCP: Precious Haws  REFERRING PROVIDER: Noemi Chapel  REFERRING DIAG: Lt Rotator Cuff Tendonitis  THERAPY DIAG:  Chronic left shoulder pain  Stiffness of left shoulder, not elsewhere classified  Muscle weakness (generalized)  Abnormal posture  Rationale for Evaluation and Treatment: Rehabilitation  ONSET DATE: 06/19/21  SUBJECTIVE:                                                                                                                                                                                      SUBJECTIVE STATEMENT: I injured  my shoulder a couple of times and had lots of different treatments. Its starting up a lot again, the MRI didn't show any tears or anything serious so they sent me to PT. It never completely healed.   PERTINENT HISTORY: DM, HTN, ORIF elbow fx 2022, shoulder arthroscopy w/clavicle resection and subacromial decompression and bicep tendon repair 2022 L,   PAIN:  Are you having pain? Yes: NPRS scale: 3/10 Pain location: L shoulder radiates down to elbow, sometimes the R Pain description: pins and needles, shoulder is constant and dull Aggravating factors: movement, if the weather is bad  Relieving factors: pain meds, voltaren, hot showers  PRECAUTIONS: None  WEIGHT BEARING RESTRICTIONS: No  FALLS:  Has patient fallen in last 6 months? No  LIVING ENVIRONMENT: Lives with: lives with their family Lives in: House/apartment Stairs: Yes: Internal: 14 steps; on right going up Has following equipment at home: None  OCCUPATION: Teacher  PLOF: Independent  PATIENT GOALS: to have more range of motion with my arm and less pain   NEXT MD VISIT:   OBJECTIVE:   DIAGNOSTIC FINDINGS:  MRI on 12/18/22 was unremarkable  PATIENT SURVEYS:  Quick Dash 22.7%  COGNITION: Overall cognitive status: Within functional limits for tasks  assessed     SENSATION: WFL  POSTURE: Rounded shoulders  UPPER EXTREMITY ROM:   Active ROM Right eval Left eval  Shoulder flexion WFL slight pain at end range 145 with pain   Shoulder extension    Shoulder abduction WFL slight pain 110 when pain starts  Shoulder adduction    Shoulder internal rotation WFL 45 pain starts  Shoulder external rotation WFL 65 pain starts  Elbow flexion    Elbow extension    Wrist flexion    Wrist extension    Wrist ulnar deviation    Wrist radial deviation    Wrist pronation    Wrist supination    (Blank rows = not tested)  UPPER EXTREMITY MMT:  MMT Right eval Left eval  Shoulder flexion 3+ 3+  Shoulder extension    Shoulder abduction 4 3+  Shoulder adduction    Shoulder internal rotation 4+ 4  Shoulder external rotation 4 3+  Middle trapezius    Lower trapezius    Elbow flexion 5 5  Elbow extension 5 5  Wrist flexion    Wrist extension    Wrist ulnar deviation    Wrist radial deviation    Wrist pronation    Wrist supination    Grip strength (lbs)    (Blank rows = not tested)  SHOULDER SPECIAL TESTS: Impingement tests: Neer impingement test: positive , Hawkins/Kennedy impingement test: positive , and Painful arc test: positive   Empty can test: positive   JOINT MOBILITY TESTING:  PROM can get full ROM with pain, some muscle guarding  PALPATION:  TTP left shoulder   TODAY'S TREATMENT:  DATE: 12/25/22- EVAL   PATIENT EDUCATION: Education details: POC and HEP Person educated: Patient Education method: Explanation Education comprehension: verbalized understanding  HOME EXERCISE PROGRAM: Access Code: [email or web address] URL: [email or web address] Date: 12/25/2022 Prepared by: Andris Baumann  Exercises - Shoulder Extension with Resistance  - 1 x daily - 7 x weekly - 2 sets - 10  reps - Standing Shoulder Row with Anchored Resistance  - 1 x daily - 7 x weekly - 2 sets - 10 reps - Shoulder External Rotation and Scapular Retraction with Resistance  - 1 x daily - 7 x weekly - 2 sets - 10 reps - Standing Shoulder Internal Rotation Stretch with Towel  - 1 x daily - 7 x weekly - 2 sets - 10 reps  ASSESSMENT:  CLINICAL IMPRESSION: Patient is a 52 y.o. female who was seen today for physical therapy evaluation and treatment for L shoulder pain.    OBJECTIVE IMPAIRMENTS: decreased ROM, decreased strength, impaired UE functional use, and pain.   ACTIVITY LIMITATIONS: carrying, lifting, and reach over head   REHAB POTENTIAL: Good  CLINICAL DECISION MAKING: Stable/uncomplicated  EVALUATION COMPLEXITY: Low  GOALS: Goals reviewed with patient? Yes  SHORT TERM GOALS: Target date: 01/22/23  Patient will be independent with initial HEP.  Goal status: INITIAL   LONG TERM GOALS: Target date: 02/26/23  Patient will be independent with advanced/ongoing HEP to improve outcomes and carryover.  Goal status: INITIAL  2.  Patient will report 75% improvement in L shoulder pain to improve QOL.  Baseline: 3/10 Goal status: INITIAL  3.  Patient to improve L shoulder AROM to [facility] without pain provocation to allow for increased ease of ADLs.  Goal status: INITIAL  4.  Patient will demonstrate improved functional UE strength as demonstrated by 5/5. Goal status: INITIAL   PLAN:  PT FREQUENCY: 1-2x/week  PT DURATION: 8 weeks  PLANNED INTERVENTIONS: Therapeutic exercises, Therapeutic activity, Neuromuscular re-education, Balance training, Gait training, Patient/Family education, Self Care, Joint mobilization, Dry Needling, Electrical stimulation, Cryotherapy, Moist heat, Vasopneumatic device, Ionotophoresis '4mg'$ /ml Dexamethasone, and Manual therapy  PLAN FOR NEXT SESSION: start gym activities for L shoulder ROM and strengthening, PROM   Joseluis Alessio, PT 12/25/2022, 10:14 AM

## 2022-12-25 ENCOUNTER — Ambulatory Visit: Payer: Medicaid Other | Attending: Physician Assistant

## 2022-12-25 DIAGNOSIS — G8929 Other chronic pain: Secondary | ICD-10-CM | POA: Diagnosis present

## 2022-12-25 DIAGNOSIS — M25512 Pain in left shoulder: Secondary | ICD-10-CM | POA: Insufficient documentation

## 2022-12-25 DIAGNOSIS — M25612 Stiffness of left shoulder, not elsewhere classified: Secondary | ICD-10-CM | POA: Diagnosis present

## 2022-12-25 DIAGNOSIS — M6281 Muscle weakness (generalized): Secondary | ICD-10-CM | POA: Insufficient documentation

## 2022-12-25 DIAGNOSIS — R293 Abnormal posture: Secondary | ICD-10-CM | POA: Diagnosis present

## 2023-01-02 ENCOUNTER — Other Ambulatory Visit: Payer: Self-pay

## 2023-01-02 ENCOUNTER — Ambulatory Visit: Payer: Medicaid Other | Attending: Physician Assistant

## 2023-01-02 DIAGNOSIS — M6281 Muscle weakness (generalized): Secondary | ICD-10-CM | POA: Insufficient documentation

## 2023-01-02 DIAGNOSIS — R252 Cramp and spasm: Secondary | ICD-10-CM | POA: Insufficient documentation

## 2023-01-02 DIAGNOSIS — G8929 Other chronic pain: Secondary | ICD-10-CM | POA: Diagnosis present

## 2023-01-02 DIAGNOSIS — M25622 Stiffness of left elbow, not elsewhere classified: Secondary | ICD-10-CM | POA: Diagnosis present

## 2023-01-02 DIAGNOSIS — R293 Abnormal posture: Secondary | ICD-10-CM | POA: Insufficient documentation

## 2023-01-02 DIAGNOSIS — M25612 Stiffness of left shoulder, not elsewhere classified: Secondary | ICD-10-CM | POA: Diagnosis present

## 2023-01-02 DIAGNOSIS — M25512 Pain in left shoulder: Secondary | ICD-10-CM | POA: Diagnosis present

## 2023-01-02 DIAGNOSIS — R600 Localized edema: Secondary | ICD-10-CM | POA: Insufficient documentation

## 2023-01-02 NOTE — Therapy (Signed)
OUTPATIENT PHYSICAL THERAPY SHOULDER Rx   Patient Name: Lynn Moody MRN: [id number] DOB:06/01/1971, 52 y.o., female Today's Date: 01/02/2023  END OF SESSION:  PT End of Session - 01/02/23 1019     Visit Number 2    Date for PT Re-Evaluation 02/26/23    Authorization Type [employer]    PT Start Time 1020    Activity Tolerance Patient tolerated treatment well    Behavior During Therapy [facility] for tasks assessed/performed              Past Medical History:  Diagnosis Date   Anemia    Diabetes mellitus without complication ([place])    Hypertension    Past Surgical History:  Procedure Laterality Date   BREAST BIOPSY     left breast with maker placed   CESAREAN SECTION     DENTAL SURGERY     ORIF ELBOW FRACTURE Left 05/11/2021   Procedure: OPEN REDUCTION INTERNAL FIXATION DISTAL HUMERUS FRACTURE;  Surgeon: Hiram Gash, MD;  Location: [place];  Service: Orthopedics;  Laterality: Left;   SHOULDER ARTHROSCOPY WITH DISTAL CLAVICLE RESECTION Left 06/28/2021   Procedure: SHOULDER ARTHROSCOPY WITH DISTAL CLAVICLE RESECTION;  Surgeon: Hiram Gash, MD;  Location: [place];  Service: Orthopedics;  Laterality: Left;   SHOULDER ARTHROSCOPY WITH SUBACROMIAL DECOMPRESSION AND BICEP TENDON REPAIR Left 06/28/2021   Procedure: SHOULDER ARTHROSCOPY WITH SUBACROMIAL DECOMPRESSION; PARTIAL ACROMIOPLASTY WITH CORACOACROMIAL RELEASE AND  BICEP TENDODESIS;  Surgeon: Hiram Gash, MD;  Location: [place];  Service: Orthopedics;  Laterality: Left;   ULNAR NERVE TRANSPOSITION Left 05/11/2021   Procedure: RADIAL  NERVE DECOMPRESSION;  Surgeon: Hiram Gash, MD;  Location: [place];  Service: Orthopedics;  Laterality: Left;   Patient Active Problem List   Diagnosis Date Noted   History of miscarriage 08/19/2015   Elevated WBC count 05/18/2015   Pneumococcal vaccination declined 03/15/2015   Diabetes mellitus, type 2 ([place]) 12/23/2014    IMPAIRED GLUCOSE TOLERANCE TEST 10/13/2010    PCP: Precious Haws  REFERRING PROVIDER: Noemi Chapel  REFERRING DIAG: Lt Rotator Cuff Tendonitis  THERAPY DIAG:  No diagnosis found.  Rationale for Evaluation and Treatment: Rehabilitation  ONSET DATE: 06/19/21  SUBJECTIVE:                                                                                                                                                                                      SUBJECTIVE STATEMENT: I injured my shoulder a couple of times and had lots of different treatments. Its starting up a lot again, the MRI didn't show any tears or anything serious so they sent me  to PT. It never completely healed.   PERTINENT HISTORY: DM, HTN, ORIF elbow fx 2022, shoulder arthroscopy w/clavicle resection and subacromial decompression and bicep tendon repair 2022 L,   PAIN:  Are you having pain? Yes: NPRS scale: 2/10 Pain location: L shoulder radiates down to elbow, sometimes the R Pain description: pins and needles, shoulder is constant and dull Aggravating factors: movement, if the weather is bad  Relieving factors: pain meds, voltaren, hot showers  PRECAUTIONS: None  WEIGHT BEARING RESTRICTIONS: No  FALLS:  Has patient fallen in last 6 months? No  LIVING ENVIRONMENT: Lives with: lives with their family Lives in: House/apartment Stairs: Yes: Internal: 14 steps; on right going up Has following equipment at home: None  OCCUPATION: Teacher  PLOF: Independent  PATIENT GOALS: to have more range of motion with my arm and less pain   NEXT MD VISIT:   OBJECTIVE:   DIAGNOSTIC FINDINGS:  MRI on 12/18/22 was unremarkable  PATIENT SURVEYS:  Quick Dash 22.7%  COGNITION: Overall cognitive status: Within functional limits for tasks assessed     SENSATION: WFL  POSTURE: Rounded shoulders  UPPER EXTREMITY ROM:   Active ROM Right eval Left eval  Shoulder flexion WFL slight pain at end range 145 with  pain   Shoulder extension    Shoulder abduction WFL slight pain 110 when pain starts  Shoulder adduction    Shoulder internal rotation WFL 45 pain starts  Shoulder external rotation WFL 65 pain starts  Elbow flexion    Elbow extension    Wrist flexion    Wrist extension    Wrist ulnar deviation    Wrist radial deviation    Wrist pronation    Wrist supination    (Blank rows = not tested)  UPPER EXTREMITY MMT:  MMT Right eval Left eval  Shoulder flexion 3+ 3+  Shoulder extension    Shoulder abduction 4 3+  Shoulder adduction    Shoulder internal rotation 4+ 4  Shoulder external rotation 4 3+  Middle trapezius    Lower trapezius    Elbow flexion 5 5  Elbow extension 5 5  Wrist flexion    Wrist extension    Wrist ulnar deviation    Wrist radial deviation    Wrist pronation    Wrist supination    Grip strength (lbs)    (Blank rows = not tested)  SHOULDER SPECIAL TESTS: Impingement tests: Neer impingement test: positive , Hawkins/Kennedy impingement test: positive , and Painful arc test: positive   Empty can test: positive   JOINT MOBILITY TESTING:  PROM can get full ROM with pain, some muscle guarding  PALPATION:  TTP left shoulder   TODAY'S TREATMENT:                                                                                                                                         DATE:  01/02/23:  Manual:  supine for inferior glides, 60 sec bouts, 3 reps, with shoulder at 90 degrees abduction.   R Sidelying for tactile cues to isolate scapular motion , all planes Supine AAROM/ stretching, with GH distraction and medial L scapular glides against trunk with L shoulder flexion Supine contract /relax L shoulder IR stretch at 90 degrees abd Prone for PA mid, lower thoracic HVLA mobs, to improve thoracic extension with shoulder flexion  Therex:  supine isometric horizontal shoulder abduction with B flexion with strap 20x R side lying for L shoulder ER(pillow  under axilla)  3 x 15 Seated for band B shoulder Er , horiz abd, and rows , yellow for Er, green for rows, all fast movements, low amplitude to increase endurance of postural musculature.  ICE:  ice pack L shoulder in sitting post treatment to assist with soreness  12/25/22- EVAL   PATIENT EDUCATION: Education details: POC and HEP Person educated: Patient Education method: Explanation Education comprehension: verbalized understanding  HOME EXERCISE PROGRAM: Access Code: [email or web address] URL: [email or web address] Date: 12/25/2022 Prepared by: Andris Baumann  Exercises - Shoulder Extension with Resistance  - 1 x daily - 7 x weekly - 2 sets - 10 reps - Standing Shoulder Row with Anchored Resistance  - 1 x daily - 7 x weekly - 2 sets - 10 reps - Shoulder External Rotation and Scapular Retraction with Resistance  - 1 x daily - 7 x weekly - 2 sets - 10 reps - Standing Shoulder Internal Rotation Stretch with Towel  - 1 x daily - 7 x weekly - 2 sets - 10 reps  ASSESSMENT:  CLINICAL IMPRESSION: Patient is a 52 y.o. female who was seen today for physical therapy treatment for L shoulder pain.  Her largest movement limitations were for IR and end range flexion today.  Responded well to manual techniques.  Fatigued and sore with the  therex.  Adapted some of her home program as well. Very stiff thoracic spine and poor scapular mobility particularly medially noted.    OBJECTIVE IMPAIRMENTS: decreased ROM, decreased strength, impaired UE functional use, and pain.   ACTIVITY LIMITATIONS: carrying, lifting, and reach over head   REHAB POTENTIAL: Good  CLINICAL DECISION MAKING: Stable/uncomplicated  EVALUATION COMPLEXITY: Low  GOALS: Goals reviewed with patient? Yes  SHORT TERM GOALS: Target date: 01/22/23  Patient will be independent with initial HEP.  Goal status: INITIAL   LONG TERM GOALS: Target date: 02/26/23  Patient will be independent with advanced/ongoing HEP to improve  outcomes and carryover.  Goal status: INITIAL  2.  Patient will report 75% improvement in L shoulder pain to improve QOL.  Baseline: 3/10 Goal status: INITIAL  3.  Patient to improve L shoulder AROM to [facility] without pain provocation to allow for increased ease of ADLs.  Goal status: INITIAL  4.  Patient will demonstrate improved functional UE strength as demonstrated by 5/5. Goal status: INITIAL   PLAN:  PT FREQUENCY: 1-2x/week  PT DURATION: 8 weeks  PLANNED INTERVENTIONS: Therapeutic exercises, Therapeutic activity, Neuromuscular re-education, Balance training, Gait training, Patient/Family education, Self Care, Joint mobilization, Dry Needling, Electrical stimulation, Cryotherapy, Moist heat, Vasopneumatic device, Ionotophoresis '4mg'$ /ml Dexamethasone, and Manual therapy  PLAN FOR NEXT SESSION: start gym activities for L shoulder ROM and strengthening, PROM   Herberto Ledwell L Luqman Perrelli, PT 01/02/2023, 12:00 PM

## 2023-01-03 ENCOUNTER — Encounter: Payer: Self-pay | Admitting: Cardiology

## 2023-01-03 ENCOUNTER — Ambulatory Visit: Payer: Medicaid Other | Admitting: Cardiology

## 2023-01-03 VITALS — BP 132/84 | HR 96 | Resp 17 | Ht 64.0 in | Wt 208.2 lb

## 2023-01-03 DIAGNOSIS — I10 Essential (primary) hypertension: Secondary | ICD-10-CM

## 2023-01-03 DIAGNOSIS — R002 Palpitations: Secondary | ICD-10-CM

## 2023-01-03 DIAGNOSIS — E1165 Type 2 diabetes mellitus with hyperglycemia: Secondary | ICD-10-CM

## 2023-01-03 DIAGNOSIS — R072 Precordial pain: Secondary | ICD-10-CM

## 2023-01-03 DIAGNOSIS — I471 Supraventricular tachycardia, unspecified: Secondary | ICD-10-CM

## 2023-01-03 DIAGNOSIS — E781 Pure hyperglyceridemia: Secondary | ICD-10-CM

## 2023-01-03 NOTE — Progress Notes (Signed)
ID:  Lynn Moody, DOB [birth year], MRN [id number]  PCP:  Bartholome Bill, MD  Cardiologist:  Rex Kras, DO, [facility] (established care 11/21/2022)  Date: 01/03/23 Last Office Visit: 11/21/2022  Chief Complaint  Patient presents with   Palpitations   Follow-up    6 weeks- elevated troponins    HPI  Lynn Moody is a 52 y.o. African-American female whose past medical history and cardiovascular risk factors include: Hypertension, non-insulin-dependent diabetes, hyperlipidemia, obesity due to excess calories.   Patient was referred to the practice for evaluation of palpitations.  She underwent a Zio patch results reviewed with her in detail and noted below for further reference.  An echocardiogram which noted preserved LVEF without any significant valvular heart disease.  Since last office visit, palpitations have improved by at least 20%.  She continues to have nonspecific precordial discomfort located substernally, occurs twice a month, lasting 5 minutes, improves with resting, not brought on by effort related activities, self-limited.  Denies orthopnea PND or lower extremity swelling  FUNCTIONAL STATUS: No structured exercise program or daily routine.   ALLERGIES: No Known Allergies  MEDICATION LIST PRIOR TO VISIT: Current Meds  Medication Sig   amLODipine-benazepril (LOTREL) 5-20 MG capsule Take 1 capsule by mouth daily.   Continuous Blood Gluc Sensor (DEXCOM G6 SENSOR) MISC SMARTSIG:1 Topical Every 10 Days   Continuous Blood Gluc Transmit (DEXCOM G6 TRANSMITTER) MISC USE AS DIRECTED FOR CONTINUOUS GLUCOSE MONITORING, REUSE FOR 90 DAYS THEN REPLACE   diclofenac Sodium (VOLTAREN) 1 % GEL Apply 2 g topically 4 (four) times daily.   Dulaglutide (TRULICITY) 1.5 TK/2.4OX SOPN Inject into the skin.   ferrous sulfate 324 MG TBEC Take 324 mg by mouth.   ibuprofen (ADVIL) 800 MG tablet Take 800 mg by mouth every 6 (six) hours as needed.   metFORMIN (GLUCOPHAGE-XR) 500 MG 24 hr  tablet Take 1,000 mg by mouth 2 (two) times daily.   traMADol (ULTRAM) 50 MG tablet Take 50 mg by mouth every 6 (six) hours as needed for moderate pain or severe pain.     PAST MEDICAL HISTORY: Past Medical History:  Diagnosis Date   Anemia    Diabetes mellitus without complication ([place])    Hypertension     PAST SURGICAL HISTORY: Past Surgical History:  Procedure Laterality Date   BREAST BIOPSY     left breast with maker placed   CESAREAN SECTION     DENTAL SURGERY     ORIF ELBOW FRACTURE Left 05/11/2021   Procedure: OPEN REDUCTION INTERNAL FIXATION DISTAL HUMERUS FRACTURE;  Surgeon: Hiram Gash, MD;  Location: [place];  Service: Orthopedics;  Laterality: Left;   SHOULDER ARTHROSCOPY WITH DISTAL CLAVICLE RESECTION Left 06/28/2021   Procedure: SHOULDER ARTHROSCOPY WITH DISTAL CLAVICLE RESECTION;  Surgeon: Hiram Gash, MD;  Location: [place];  Service: Orthopedics;  Laterality: Left;   SHOULDER ARTHROSCOPY WITH SUBACROMIAL DECOMPRESSION AND BICEP TENDON REPAIR Left 06/28/2021   Procedure: SHOULDER ARTHROSCOPY WITH SUBACROMIAL DECOMPRESSION; PARTIAL ACROMIOPLASTY WITH CORACOACROMIAL RELEASE AND  BICEP TENDODESIS;  Surgeon: Hiram Gash, MD;  Location: [place];  Service: Orthopedics;  Laterality: Left;   ULNAR NERVE TRANSPOSITION Left 05/11/2021   Procedure: RADIAL  NERVE DECOMPRESSION;  Surgeon: Hiram Gash, MD;  Location: [place];  Service: Orthopedics;  Laterality: Left;    FAMILY HISTORY: The patient family history includes Diabetes in her mother and paternal grandmother; Glaucoma in her father and mother; Hypertension in her  paternal grandmother; Prostate cancer in her father.  SOCIAL HISTORY:  The patient  reports that she has never smoked. She has never used smokeless tobacco. She reports that she does not drink alcohol and does not use drugs.  REVIEW OF SYSTEMS: Review of Systems  Cardiovascular:   Positive for chest pain (see HPI) and palpitations (Less frequent). Negative for claudication, dyspnea on exertion, irregular heartbeat, leg swelling, near-syncope, orthopnea, paroxysmal nocturnal dyspnea and syncope.  Respiratory:  Negative for shortness of breath.   Hematologic/Lymphatic: Negative for bleeding problem.  Musculoskeletal:  Negative for muscle cramps and myalgias.  Neurological:  Positive for headaches (better). Negative for dizziness and light-headedness.    PHYSICAL EXAM:    01/03/2023   12:05 PM 11/21/2022    9:30 AM 11/04/2022   10:00 PM  Vitals with BMI  Height '5\' 4"'$  '5\' 4"'$    Weight 208 lbs 3 oz 208 lbs 10 oz   BMI 96.28 36.62   Systolic 947 654 650  Diastolic 84 88 96  Pulse 96 84 86    Physical Exam  Constitutional: No distress.  Age appropriate, hemodynamically stable.   Neck: No JVD present.  Cardiovascular: Normal rate, regular rhythm, S1 normal, S2 normal, intact distal pulses and normal pulses. Exam reveals no gallop, no S3 and no S4.  No murmur heard. Pulmonary/Chest: Effort normal and breath sounds normal. No stridor. She has no wheezes. She has no rales.  Abdominal: Soft. Bowel sounds are normal. She exhibits no distension. There is no abdominal tenderness.  Musculoskeletal:        General: No edema.     Cervical back: Neck supple.  Neurological: She is alert and oriented to person, place, and time. She has intact cranial nerves (2-12).  Skin: Skin is warm and moist.   CARDIAC DATABASE: EKG: 11/21/2022: Normal sinus rhythm, 88 bpm, normal axis, without underlying ischemia or injury pattern.  Echocardiogram: 12/05/2022: Normal LV systolic function with visual EF 60-65%. Left ventricle cavity is normal in size. Normal left ventricular wall thickness. Normal global wall motion. Normal diastolic filling pattern, normal LAP.  Mild tricuspid regurgitation. No evidence of pulmonary hypertension. No prior study for comparison.    Stress  Testing: NA.  Heart Catheterization: February 28, [facility]28 [facility] Dr. Philbert Riser. No epicardial coronary artery disease per report  Cardiac monitor [facility] Patch): December 05, 2022 -December 18, 2022 Dominant rhythm sinus, followed by tachycardia (burden 25%). Heart rate 59-190 bpm. Avg HR 94 bpm. No atrial fibrillation, ventricular tachycardia, high grade AV block, pauses (3 seconds or longer). Total ventricular ectopic burden <1%. Total supraventricular ectopic burden <1%. Rare episodes of PSVT likely atrial tachycardia (Fastest episode 33 beats, 11 seconds, average HR 163 bpm, maximum HR 190 bpm). Patient triggered events: 2. These did not correlate with any significant dysrhythmias.   LABORATORY DATA:    Latest Ref Rng & Units 11/04/2022    8:50 PM 08/03/2015    7:31 PM 07/25/2015   12:44 PM  CBC  WBC 4.0 - 10.5 K/uL 15.4  13.1  11.8   Hemoglobin 12.0 - 15.0 g/dL 12.3  11.1  10.9   Hematocrit 36.0 - 46.0 % 37.9  33.7  33.8   Platelets 150 - 400 K/uL 459  357  351        Latest Ref Rng & Units 11/04/2022    8:50 PM 06/28/2021   10:00 AM 05/09/2021    2:30 PM  CMP  Glucose 70 - 99 mg/dL  280  117  94   BUN 6 - 20 mg/dL '8  7  6   '$ Creatinine 0.44 - 1.00 mg/dL 0.69  0.62  0.51   Sodium 135 - 145 mmol/L 134  139  136   Potassium 3.5 - 5.1 mmol/L 3.9  3.6  3.9   Chloride 98 - 111 mmol/L 98  104  102   CO2 22 - 32 mmol/L '27  24  28   '$ Calcium 8.9 - 10.3 mg/dL 8.7  8.7  9.1     External Labs: Collected: 11/04/2022 [facility]. WBC 13.9, hemoglobin 12.6, hematocrit 38.7, platelets 423 Hemoglobin A1c 9. TSH 0.79 Troponin I 30 Sodium 136, potassium 3.9, chloride 98, bicarb 31, BUN 7, creatinine 0.68 eGFR >90  Collected: 06/04/2022. Total cholesterol 132, triglyceride 173, HDL 35, LDL 62 non-HDL 97  IMPRESSION:    ICD-10-CM   1. Precordial chest pain  R07.2 CT CARDIAC SCORING ([facility] LOCATIONS ONLY)    PCV  CARDIAC STRESS TEST    2. Palpitations  R00.2     3. PSVT (paroxysmal supraventricular tachycardia)  I47.10     4. Benign hypertension  I10     5. Type 2 diabetes mellitus with hyperglycemia, without long-term current use of insulin (HCC)  E11.65     6. Hypertriglyceridemia  E78.1        RECOMMENDATIONS: Lynn Moody is a 52 y.o. African-American female whose past medical history and cardiac risk factors include: Hypertension, non-insulin-dependent diabetes, hyperlipidemia, obesity due to excess calories.   Precordial pain: Symptoms are not entirely cardiac based on HPI. However has multiple cardiovascular risk factors and therefore recommend further evaluation. Coronary calcium score for further risk stratification Exercise treadmill stress test to evaluate for functional capacity and exercise-induced ischemia. Educated on seeking medical attention sooner by going to the closest ER via EMS if the symptoms increase in intensity, frequency, duration, or has typical chest pain as discussed in the office.  Patient verbalized understanding.  Palpitations Improving Zio patch results reviewed.  Underlying rhythm sinus with episodes of PSVT but overall PAC and PVC burden less than 1%.  We discussed initiating diltiazem 120 mg p.o. daily for symptom management but she would like to hold off on medical therapy at this time. No identifiable reversible causes. Hemoglobin and TSH within normal limits. EKG: Nonischemic. Educated her on importance of keeping herself well-hydrated, trial of Valsalva maneuver when she has prolonged episodes, and applying cold water to the face to see if she has any relief of symptoms.  Benign hypertension Office blood pressures have improved significantly. She is also making a good attempt on reducing high salt foods such as soup, tuna, etc. Would recommend a goal SBP of 130 mmHg consistently. Currently managed by primary care provider.  Type 2 diabetes  mellitus with hyperglycemia, without long-term current use of insulin (HCC) A1c not well-controlled. Currently on ACE inhibitors.  From cardiovascular standpoint would recommend statin in the setting of diabetes and additional cardiovascular risk factors  Hypertriglyceridemia Not well-controlled as of July 2023. Reemphasized the importance of dietary restriction. Currently managed by primary care provider.  FINAL MEDICATION LIST END OF ENCOUNTER: No orders of the defined types were placed in this encounter.   There are no discontinued medications.    Current Outpatient Medications:    amLODipine-benazepril (LOTREL) 5-20 MG capsule, Take 1 capsule by mouth daily., Disp: , Rfl:    Continuous Blood Gluc Sensor (DEXCOM G6 SENSOR) MISC, SMARTSIG:1 Topical Every  10 Days, Disp: , Rfl:    Continuous Blood Gluc Transmit (DEXCOM G6 TRANSMITTER) MISC, USE AS DIRECTED FOR CONTINUOUS GLUCOSE MONITORING, REUSE FOR 90 DAYS THEN REPLACE, Disp: , Rfl:    diclofenac Sodium (VOLTAREN) 1 % GEL, Apply 2 g topically 4 (four) times daily., Disp: , Rfl:    Dulaglutide (TRULICITY) 1.5 BV/6.7OL SOPN, Inject into the skin., Disp: , Rfl:    ferrous sulfate 324 MG TBEC, Take 324 mg by mouth., Disp: , Rfl:    ibuprofen (ADVIL) 800 MG tablet, Take 800 mg by mouth every 6 (six) hours as needed., Disp: , Rfl:    metFORMIN (GLUCOPHAGE-XR) 500 MG 24 hr tablet, Take 1,000 mg by mouth 2 (two) times daily., Disp: , Rfl:    traMADol (ULTRAM) 50 MG tablet, Take 50 mg by mouth every 6 (six) hours as needed for moderate pain or severe pain., Disp: , Rfl:   Orders Placed This Encounter  Procedures   CT CARDIAC SCORING ([facility] LOCATIONS ONLY)   PCV CARDIAC STRESS TEST    There are no Patient Instructions on file for this visit.   --Continue cardiac medications as reconciled in final medication list. --Return in about 3 months (around 04/03/2023) for Follow up, Palpitations, Review test results. or sooner if needed. --Continue  follow-up with your primary care physician regarding the management of your other chronic comorbid conditions.  Patient's questions and concerns were addressed to her satisfaction. She voices understanding of the instructions provided during this encounter.   This note was created using a voice recognition software as a result there may be grammatical errors inadvertently enclosed that do not reflect the nature of this encounter. Every attempt is made to correct such errors.  Rex Kras, [place], [facility]  Pager: [phone] Office: [phone]

## 2023-01-04 ENCOUNTER — Ambulatory Visit: Payer: Medicaid Other | Admitting: Physical Therapy

## 2023-01-07 ENCOUNTER — Ambulatory Visit: Payer: Medicaid Other

## 2023-01-07 DIAGNOSIS — R293 Abnormal posture: Secondary | ICD-10-CM

## 2023-01-07 DIAGNOSIS — M25612 Stiffness of left shoulder, not elsewhere classified: Secondary | ICD-10-CM | POA: Diagnosis not present

## 2023-01-07 DIAGNOSIS — M6281 Muscle weakness (generalized): Secondary | ICD-10-CM

## 2023-01-07 DIAGNOSIS — G8929 Other chronic pain: Secondary | ICD-10-CM

## 2023-01-07 DIAGNOSIS — R072 Precordial pain: Secondary | ICD-10-CM

## 2023-01-07 NOTE — Therapy (Signed)
OUTPATIENT PHYSICAL THERAPY SHOULDER Rx   Patient Name: Lynn Moody MRN: [id number] DOB:March 25, 1971, 52 y.o., female Today's Date: 01/07/2023  END OF SESSION:  PT End of Session - 01/07/23 0938     Visit Number 3    Date for PT Re-Evaluation 02/26/23    Authorization Type [employer]    PT Start Time [phone]    PT Stop Time 1023    PT Time Calculation (min) 45 min    Activity Tolerance Patient tolerated treatment well    Behavior During Therapy WFL for tasks assessed/performed              Past Medical History:  Diagnosis Date   Anemia    Diabetes mellitus without complication ([place])    Hypertension    Past Surgical History:  Procedure Laterality Date   BREAST BIOPSY     left breast with maker placed   CESAREAN SECTION     DENTAL SURGERY     ORIF ELBOW FRACTURE Left 05/11/2021   Procedure: OPEN REDUCTION INTERNAL FIXATION DISTAL HUMERUS FRACTURE;  Surgeon: Hiram Gash, MD;  Location: [place];  Service: Orthopedics;  Laterality: Left;   SHOULDER ARTHROSCOPY WITH DISTAL CLAVICLE RESECTION Left 06/28/2021   Procedure: SHOULDER ARTHROSCOPY WITH DISTAL CLAVICLE RESECTION;  Surgeon: Hiram Gash, MD;  Location: [place];  Service: Orthopedics;  Laterality: Left;   SHOULDER ARTHROSCOPY WITH SUBACROMIAL DECOMPRESSION AND BICEP TENDON REPAIR Left 06/28/2021   Procedure: SHOULDER ARTHROSCOPY WITH SUBACROMIAL DECOMPRESSION; PARTIAL ACROMIOPLASTY WITH CORACOACROMIAL RELEASE AND  BICEP TENDODESIS;  Surgeon: Hiram Gash, MD;  Location: [place];  Service: Orthopedics;  Laterality: Left;   ULNAR NERVE TRANSPOSITION Left 05/11/2021   Procedure: RADIAL  NERVE DECOMPRESSION;  Surgeon: Hiram Gash, MD;  Location: [place];  Service: Orthopedics;  Laterality: Left;   Patient Active Problem List   Diagnosis Date Noted   History of miscarriage 08/19/2015   Elevated WBC count 05/18/2015   Pneumococcal vaccination declined  03/15/2015   Diabetes mellitus, type 2 ([place]) 12/23/2014   IMPAIRED GLUCOSE TOLERANCE TEST 10/13/2010    PCP: Precious Haws  REFERRING PROVIDER: Noemi Chapel  REFERRING DIAG: Lt Rotator Cuff Tendonitis  THERAPY DIAG:  Stiffness of left shoulder, not elsewhere classified  Chronic left shoulder pain  Muscle weakness (generalized)  Abnormal posture  Rationale for Evaluation and Treatment: Rehabilitation  ONSET DATE: 06/19/21  SUBJECTIVE:                                                                                                                                                                                      SUBJECTIVE STATEMENT: Because  of weather I am hurting.   PERTINENT HISTORY: DM, HTN, ORIF elbow fx 2022, shoulder arthroscopy w/clavicle resection and subacromial decompression and bicep tendon repair 2022 L,   PAIN:  Are you having pain? Yes: NPRS scale: 2/10 Pain location: L shoulder radiates down to elbow, sometimes the R Pain description: pins and needles, shoulder is constant and dull Aggravating factors: movement, if the weather is bad  Relieving factors: pain meds, voltaren, hot showers  PRECAUTIONS: None  WEIGHT BEARING RESTRICTIONS: No  FALLS:  Has patient fallen in last 6 months? No  LIVING ENVIRONMENT: Lives with: lives with their family Lives in: House/apartment Stairs: Yes: Internal: 14 steps; on right going up Has following equipment at home: None  OCCUPATION: Teacher  PLOF: Independent  PATIENT GOALS: to have more range of motion with my arm and less pain   NEXT MD VISIT:   OBJECTIVE:   DIAGNOSTIC FINDINGS:  MRI on 12/18/22 was unremarkable  PATIENT SURVEYS:  Quick Dash 22.7%  COGNITION: Overall cognitive status: Within functional limits for tasks assessed     SENSATION: WFL  POSTURE: Rounded shoulders  UPPER EXTREMITY ROM:   Active ROM Right eval Left eval  Shoulder flexion WFL slight pain at end range 145 with  pain   Shoulder extension    Shoulder abduction WFL slight pain 110 when pain starts  Shoulder adduction    Shoulder internal rotation WFL 45 pain starts  Shoulder external rotation WFL 65 pain starts  Elbow flexion    Elbow extension    Wrist flexion    Wrist extension    Wrist ulnar deviation    Wrist radial deviation    Wrist pronation    Wrist supination    (Blank rows = not tested)  UPPER EXTREMITY MMT:  MMT Right eval Left eval  Shoulder flexion 3+ 3+  Shoulder extension    Shoulder abduction 4 3+  Shoulder adduction    Shoulder internal rotation 4+ 4  Shoulder external rotation 4 3+  Middle trapezius    Lower trapezius    Elbow flexion 5 5  Elbow extension 5 5  Wrist flexion    Wrist extension    Wrist ulnar deviation    Wrist radial deviation    Wrist pronation    Wrist supination    Grip strength (lbs)    (Blank rows = not tested)  SHOULDER SPECIAL TESTS: Impingement tests: Neer impingement test: positive , Hawkins/Kennedy impingement test: positive , and Painful arc test: positive   Empty can test: positive   JOINT MOBILITY TESTING:  PROM can get full ROM with pain, some muscle guarding  PALPATION:  TTP left shoulder   TODAY'S TREATMENT:                                                                                                                                         DATE:  01/07/23 UBE L2  x3 mins each way  Rows and ext red 2x10 Supine flexion 2# WaTE 2x10 Chest press 2# WaTE 2x10 PROM to L shoulder all directions ER/IR 2# 2x10 Horizontal abd green 2x10 Lateral raises 2# 2x10 OHP yellow 2x10 ICE:  ice pack L shoulder in sitting post treatment to assist with soreness x45mns  01/02/23:  Manual:  supine for inferior glides, 60 sec bouts, 3 reps, with shoulder at 90 degrees abduction.   R Sidelying for tactile cues to isolate scapular motion , all planes Supine AAROM/ stretching, with GH distraction and medial L scapular glides against  trunk with L shoulder flexion Supine contract /relax L shoulder IR stretch at 90 degrees abd Prone for PA mid, lower thoracic HVLA mobs, to improve thoracic extension with shoulder flexion  Therex:  supine isometric horizontal shoulder abduction with B flexion with strap 20x R side lying for L shoulder ER(pillow under axilla)  3 x 15 Seated for band B shoulder Er , horiz abd, and rows , yellow for Er, green for rows, all fast movements, low amplitude to increase endurance of postural musculature.  ICE:  ice pack L shoulder in sitting post treatment to assist with soreness  12/25/22- EVAL   PATIENT EDUCATION: Education details: POC and HEP Person educated: Patient Education method: Explanation Education comprehension: verbalized understanding  HOME EXERCISE PROGRAM: Access Code: [email or web address]: [email or web address] Date: 12/25/2022 Prepared by: MAndris Baumann Exercises - Shoulder Extension with Resistance  - 1 x daily - 7 x weekly - 2 sets - 10 reps - Standing Shoulder Row with Anchored Resistance  - 1 x daily - 7 x weekly - 2 sets - 10 reps - Shoulder External Rotation and Scapular Retraction with Resistance  - 1 x daily - 7 x weekly - 2 sets - 10 reps - Standing Shoulder Internal Rotation Stretch with Towel  - 1 x daily - 7 x weekly - 2 sets - 10 reps  ASSESSMENT:  CLINICAL IMPRESSION: Patient returns with some shoulder pain, attributes it to possibly be because of the rainy weather. Her largest movement limitations were for IR and end range abduction today.  Responded well to manual techniques. Tolerates all strengthen exercises well.     OBJECTIVE IMPAIRMENTS: decreased ROM, decreased strength, impaired UE functional use, and pain.   ACTIVITY LIMITATIONS: carrying, lifting, and reach over head   REHAB POTENTIAL: Good  CLINICAL DECISION MAKING: Stable/uncomplicated  EVALUATION COMPLEXITY: Low  GOALS: Goals reviewed with patient? Yes  SHORT TERM GOALS:  Target date: 01/22/23  Patient will be independent with initial HEP.  Goal status: INITIAL   LONG TERM GOALS: Target date: 02/26/23  Patient will be independent with advanced/ongoing HEP to improve outcomes and carryover.  Goal status: INITIAL  2.  Patient will report 75% improvement in L shoulder pain to improve QOL.  Baseline: 3/10 Goal status: INITIAL  3.  Patient to improve L shoulder [facility] provocation to allow for increased ease of ADLs.  Goal status: INITIAL  4.  Patient will demonstrate improved functional UE strength as demonstrated by 5/5. Goal status: INITIAL   PLAN:  PT FREQUENCY: 1-2x/week  PT DURATION: 8 weeks  PLANNED INTERVENTIONS: Therapeutic exercises, Therapeutic activity, Neuromuscular re-education, Balance training, Gait training, Patient/Family education, Self Care, Joint mobilization, Dry Needling, Electrical stimulation, Cryotherapy, Moist heat, Vasopneumatic device, Ionotophoresis '4mg'$ /ml Dexamethasone, and Manual therapy  PLAN FOR NEXT SESSION: start gym activities for L shoulder ROM and strengthening, PROM   MAndris Baumann PT 01/07/2023, 10:32  AM  

## 2023-01-08 NOTE — Therapy (Signed)
OUTPATIENT PHYSICAL THERAPY SHOULDER Rx   Patient Name: Lynn Moody MRN: [id number] DOB:[birth year], 52 y.o., female Today's Date: 01/09/2023  END OF SESSION:  PT End of Session - 01/09/23 1446     Visit Number 4    Date for PT Re-Evaluation 02/26/23    Authorization Type [employer]    PT Start Time 1445    PT Stop Time 1530    PT Time Calculation (min) 45 min    Activity Tolerance Patient tolerated treatment well    Behavior During Therapy WFL for tasks assessed/performed              Past Medical History:  Diagnosis Date   Anemia    Diabetes mellitus without complication ([place])    Hypertension    Past Surgical History:  Procedure Laterality Date   BREAST BIOPSY     left breast with maker placed   CESAREAN SECTION     DENTAL SURGERY     ORIF ELBOW FRACTURE Left 05/11/2021   Procedure: OPEN REDUCTION INTERNAL FIXATION DISTAL HUMERUS FRACTURE;  Surgeon: Hiram Gash, MD;  Location: [place];  Service: Orthopedics;  Laterality: Left;   SHOULDER ARTHROSCOPY WITH DISTAL CLAVICLE RESECTION Left 06/28/2021   Procedure: SHOULDER ARTHROSCOPY WITH DISTAL CLAVICLE RESECTION;  Surgeon: Hiram Gash, MD;  Location: [place];  Service: Orthopedics;  Laterality: Left;   SHOULDER ARTHROSCOPY WITH SUBACROMIAL DECOMPRESSION AND BICEP TENDON REPAIR Left 06/28/2021   Procedure: SHOULDER ARTHROSCOPY WITH SUBACROMIAL DECOMPRESSION; PARTIAL ACROMIOPLASTY WITH CORACOACROMIAL RELEASE AND  BICEP TENDODESIS;  Surgeon: Hiram Gash, MD;  Location: [place];  Service: Orthopedics;  Laterality: Left;   ULNAR NERVE TRANSPOSITION Left 05/11/2021   Procedure: RADIAL  NERVE DECOMPRESSION;  Surgeon: Hiram Gash, MD;  Location: [place];  Service: Orthopedics;  Laterality: Left;   Patient Active Problem List   Diagnosis Date Noted   History of miscarriage 08/19/2015   Elevated WBC count 05/18/2015   Pneumococcal vaccination declined  03/15/2015   Diabetes mellitus, type 2 ([place]) 12/23/2014   IMPAIRED GLUCOSE TOLERANCE TEST 10/13/2010    PCP: Precious Haws  REFERRING PROVIDER: Noemi Chapel  REFERRING DIAG: Lt Rotator Cuff Tendonitis  THERAPY DIAG:  Stiffness of left shoulder, not elsewhere classified  Chronic left shoulder pain  Muscle weakness (generalized)  Abnormal posture  Rationale for Evaluation and Treatment: Rehabilitation  ONSET DATE: 06/19/21  SUBJECTIVE:                                                                                                                                                                                      SUBJECTIVE STATEMENT: Had  some pain this morning but in the moment I am okay, no pain.   PERTINENT HISTORY: DM, HTN, ORIF elbow fx 2022, shoulder arthroscopy w/clavicle resection and subacromial decompression and bicep tendon repair 2022 L,   PAIN:  Are you having pain? Yes: NPRS scale: 0/10 Pain location: L shoulder radiates down to elbow, sometimes the R Pain description: pins and needles, shoulder is constant and dull Aggravating factors: movement, if the weather is bad  Relieving factors: pain meds, voltaren, hot showers  PRECAUTIONS: None  WEIGHT BEARING RESTRICTIONS: No  FALLS:  Has patient fallen in last 6 months? No  LIVING ENVIRONMENT: Lives with: lives with their family Lives in: House/apartment Stairs: Yes: Internal: 14 steps; on right going up Has following equipment at home: None  OCCUPATION: Teacher  PLOF: Independent  PATIENT GOALS: to have more range of motion with my arm and less pain   NEXT MD VISIT:   OBJECTIVE:   DIAGNOSTIC FINDINGS:  MRI on 12/18/22 was unremarkable  PATIENT SURVEYS:  Quick Dash 22.7%  COGNITION: Overall cognitive status: Within functional limits for tasks assessed     SENSATION: WFL  POSTURE: Rounded shoulders  UPPER EXTREMITY ROM:   Active ROM Right eval Left eval  Shoulder flexion WFL  slight pain at end range 145 with pain   Shoulder extension    Shoulder abduction WFL slight pain 110 when pain starts  Shoulder adduction    Shoulder internal rotation WFL 45 pain starts  Shoulder external rotation WFL 65 pain starts  Elbow flexion    Elbow extension    Wrist flexion    Wrist extension    Wrist ulnar deviation    Wrist radial deviation    Wrist pronation    Wrist supination    (Blank rows = not tested)  UPPER EXTREMITY MMT:  MMT Right eval Left eval  Shoulder flexion 3+ 3+  Shoulder extension    Shoulder abduction 4 3+  Shoulder adduction    Shoulder internal rotation 4+ 4  Shoulder external rotation 4 3+  Middle trapezius    Lower trapezius    Elbow flexion 5 5  Elbow extension 5 5  Wrist flexion    Wrist extension    Wrist ulnar deviation    Wrist radial deviation    Wrist pronation    Wrist supination    Grip strength (lbs)    (Blank rows = not tested)  SHOULDER SPECIAL TESTS: Impingement tests: Neer impingement test: positive , Hawkins/Kennedy impingement test: positive , and Painful arc test: positive   Empty can test: positive   JOINT MOBILITY TESTING:  PROM can get full ROM with pain, some muscle guarding  PALPATION:  TTP left shoulder   TODAY'S TREATMENT:  DATE:  01/09/23 UBE L2 x3 mins each way IR stretch with towel 5s holds x10 ER with green band 2x10 Shoulder ext 5# x10, 10# x10  Cable rows 10# 2x10 IYT 2# 2x10  OHP yellow ball 2x10 MH to L shoulder 11mns   01/07/23 UBE L2  x3 mins each way  Rows and ext red 2x10 Supine flexion 2# WaTE 2x10 Chest press 2# WaTE 2x10 PROM to L shoulder all directions ER/IR 2# 2x10 Horizontal abd green 2x10 Lateral raises 2# 2x10 OHP yellow 2x10 ICE:  ice pack L shoulder in sitting post treatment to assist with soreness x150ms  01/02/23:  Manual:   supine for inferior glides, 60 sec bouts, 3 reps, with shoulder at 90 degrees abduction.   R Sidelying for tactile cues to isolate scapular motion , all planes Supine AAROM/ stretching, with GH distraction and medial L scapular glides against trunk with L shoulder flexion Supine contract /relax L shoulder IR stretch at 90 degrees abd Prone for PA mid, lower thoracic HVLA mobs, to improve thoracic extension with shoulder flexion  Therex:  supine isometric horizontal shoulder abduction with B flexion with strap 20x R side lying for L shoulder ER(pillow under axilla)  3 x 15 Seated for band B shoulder Er , horiz abd, and rows , yellow for Er, green for rows, all fast movements, low amplitude to increase endurance of postural musculature.  ICE:  ice pack L shoulder in sitting post treatment to assist with soreness  12/25/22- EVAL   PATIENT EDUCATION: Education details: POC and HEP Person educated: Patient Education method: Explanation Education comprehension: verbalized understanding  HOME EXERCISE PROGRAM: Access Code: [email or web address]: [email or web address] Date: 12/25/2022 Prepared by: MoAndris BaumannExercises - Shoulder Extension with Resistance  - 1 x daily - 7 x weekly - 2 sets - 10 reps - Standing Shoulder Row with Anchored Resistance  - 1 x daily - 7 x weekly - 2 sets - 10 reps - Shoulder External Rotation and Scapular Retraction with Resistance  - 1 x daily - 7 x weekly - 2 sets - 10 reps - Standing Shoulder Internal Rotation Stretch with Towel  - 1 x daily - 7 x weekly - 2 sets - 10 reps  ASSESSMENT:  CLINICAL IMPRESSION: Patient returns with some shoulder pain. We did a lot more strengthening today, she does have fatigue with OHP and 2# weight IYT exercise. Ended with MH to help with pain, she usually does ice and wanted to try to see if heat was any better. Continue to progress as tolerated.    OBJECTIVE IMPAIRMENTS: decreased ROM, decreased strength, impaired UE  functional use, and pain.   ACTIVITY LIMITATIONS: carrying, lifting, and reach over head   REHAB POTENTIAL: Good  CLINICAL DECISION MAKING: Stable/uncomplicated  EVALUATION COMPLEXITY: Low  GOALS: Goals reviewed with patient? Yes  SHORT TERM GOALS: Target date: 01/22/23  Patient will be independent with initial HEP.  Goal status: INITIAL   LONG TERM GOALS: Target date: 02/26/23  Patient will be independent with advanced/ongoing HEP to improve outcomes and carryover.  Goal status: INITIAL  2.  Patient will report 75% improvement in L shoulder pain to improve QOL.  Baseline: 3/10 Goal status: INITIAL  3.  Patient to improve L shoulder AROM to [facility] pain provocation to allow for increased ease of ADLs.  Goal status: INITIAL  4.  Patient will demonstrate improved functional UE strength as demonstrated by 5/5. Goal status: INITIAL   PLAN:  PT FREQUENCY: 1-2x/week  PT DURATION: 8 weeks  PLANNED INTERVENTIONS: Therapeutic exercises, Therapeutic activity, Neuromuscular re-education, Balance training, Gait training, Patient/Family education, Self Care, Joint mobilization, Dry Needling, Electrical stimulation, Cryotherapy, Moist heat, Vasopneumatic device, Ionotophoresis 56m/ml Dexamethasone, and Manual therapy  PLAN FOR NEXT SESSION: start gym activities for L shoulder ROM and strengthening, PROM   MAndris Baumann PT 01/09/2023, 3:33 PM

## 2023-01-09 ENCOUNTER — Ambulatory Visit: Payer: Medicaid Other

## 2023-01-09 DIAGNOSIS — M6281 Muscle weakness (generalized): Secondary | ICD-10-CM

## 2023-01-09 DIAGNOSIS — R293 Abnormal posture: Secondary | ICD-10-CM

## 2023-01-09 DIAGNOSIS — G8929 Other chronic pain: Secondary | ICD-10-CM

## 2023-01-09 DIAGNOSIS — M25612 Stiffness of left shoulder, not elsewhere classified: Secondary | ICD-10-CM | POA: Diagnosis not present

## 2023-01-11 NOTE — Progress Notes (Signed)
Gave patient results, she acknowledged understanding and had no further questions.

## 2023-01-14 ENCOUNTER — Ambulatory Visit: Payer: Medicaid Other

## 2023-01-14 DIAGNOSIS — R293 Abnormal posture: Secondary | ICD-10-CM

## 2023-01-14 DIAGNOSIS — G8929 Other chronic pain: Secondary | ICD-10-CM

## 2023-01-14 DIAGNOSIS — M25612 Stiffness of left shoulder, not elsewhere classified: Secondary | ICD-10-CM

## 2023-01-14 DIAGNOSIS — M6281 Muscle weakness (generalized): Secondary | ICD-10-CM

## 2023-01-14 NOTE — Therapy (Signed)
OUTPATIENT PHYSICAL THERAPY SHOULDER Rx   Patient Name: Lynn Moody MRN: [id number] DOB:04-Oct-1971, 52 y.o., female Today's Date: 01/14/2023  END OF SESSION:  PT End of Session - 01/14/23 0939     Visit Number 5    Date for PT Re-Evaluation 02/26/23    Authorization Type [employer]    PT Start Time [phone]    PT Stop Time 1018    PT Time Calculation (min) 40 min    Activity Tolerance Patient tolerated treatment well    Behavior During Therapy WFL for tasks assessed/performed               Past Medical History:  Diagnosis Date   Anemia    Diabetes mellitus without complication ([place])    Hypertension    Past Surgical History:  Procedure Laterality Date   BREAST BIOPSY     left breast with maker placed   CESAREAN SECTION     DENTAL SURGERY     ORIF ELBOW FRACTURE Left 05/11/2021   Procedure: OPEN REDUCTION INTERNAL FIXATION DISTAL HUMERUS FRACTURE;  Surgeon: Hiram Gash, MD;  Location: [place];  Service: Orthopedics;  Laterality: Left;   SHOULDER ARTHROSCOPY WITH DISTAL CLAVICLE RESECTION Left 06/28/2021   Procedure: SHOULDER ARTHROSCOPY WITH DISTAL CLAVICLE RESECTION;  Surgeon: Hiram Gash, MD;  Location: [place];  Service: Orthopedics;  Laterality: Left;   SHOULDER ARTHROSCOPY WITH SUBACROMIAL DECOMPRESSION AND BICEP TENDON REPAIR Left 06/28/2021   Procedure: SHOULDER ARTHROSCOPY WITH SUBACROMIAL DECOMPRESSION; PARTIAL ACROMIOPLASTY WITH CORACOACROMIAL RELEASE AND  BICEP TENDODESIS;  Surgeon: Hiram Gash, MD;  Location: [place];  Service: Orthopedics;  Laterality: Left;   ULNAR NERVE TRANSPOSITION Left 05/11/2021   Procedure: RADIAL  NERVE DECOMPRESSION;  Surgeon: Hiram Gash, MD;  Location: [place];  Service: Orthopedics;  Laterality: Left;   Patient Active Problem List   Diagnosis Date Noted   History of miscarriage 08/19/2015   Elevated WBC count 05/18/2015   Pneumococcal vaccination declined  03/15/2015   Diabetes mellitus, type 2 ([place]) 12/23/2014   IMPAIRED GLUCOSE TOLERANCE TEST 10/13/2010    PCP: Precious Haws  REFERRING PROVIDER: Noemi Chapel  REFERRING DIAG: Lt Rotator Cuff Tendonitis  THERAPY DIAG:  Stiffness of left shoulder, not elsewhere classified  Chronic left shoulder pain  Muscle weakness (generalized)  Abnormal posture  Rationale for Evaluation and Treatment: Rehabilitation  ONSET DATE: 06/19/21  SUBJECTIVE:                                                                                                                                                                                      SUBJECTIVE STATEMENT:  I was sore for like 2 days after last visit but I feel good today   PERTINENT HISTORY: DM, HTN, ORIF elbow fx 2022, shoulder arthroscopy w/clavicle resection and subacromial decompression and bicep tendon repair 2022 L  PAIN:  Are you having pain? Yes: NPRS scale: 0/10 Pain location: L shoulder radiates down to elbow, sometimes the R Pain description: pins and needles, shoulder is constant and dull Aggravating factors: movement, if the weather is bad  Relieving factors: pain meds, voltaren, hot showers  PRECAUTIONS: None  WEIGHT BEARING RESTRICTIONS: No  FALLS:  Has patient fallen in last 6 months? No  LIVING ENVIRONMENT: Lives with: lives with their family Lives in: House/apartment Stairs: Yes: Internal: 14 steps; on right going up Has following equipment at home: None  OCCUPATION: Teacher  PLOF: Independent  PATIENT GOALS: to have more range of motion with my arm and less pain   NEXT MD VISIT:   OBJECTIVE:   DIAGNOSTIC FINDINGS:  MRI on 12/18/22 was unremarkable  PATIENT SURVEYS:  Quick Dash 22.7%  COGNITION: Overall cognitive status: Within functional limits for tasks assessed     SENSATION: WFL  POSTURE: Rounded shoulders  UPPER EXTREMITY ROM:   Active ROM Right eval Left eval  Shoulder flexion WFL  slight pain at end range 145 with pain   Shoulder extension    Shoulder abduction WFL slight pain 110 when pain starts  Shoulder adduction    Shoulder internal rotation WFL 45 pain starts  Shoulder external rotation WFL 65 pain starts  Elbow flexion    Elbow extension    Wrist flexion    Wrist extension    Wrist ulnar deviation    Wrist radial deviation    Wrist pronation    Wrist supination    (Blank rows = not tested)  UPPER EXTREMITY MMT:  MMT Right eval Left eval  Shoulder flexion 3+ 3+  Shoulder extension    Shoulder abduction 4 3+  Shoulder adduction    Shoulder internal rotation 4+ 4  Shoulder external rotation 4 3+  Middle trapezius    Lower trapezius    Elbow flexion 5 5  Elbow extension 5 5  Wrist flexion    Wrist extension    Wrist ulnar deviation    Wrist radial deviation    Wrist pronation    Wrist supination    Grip strength (lbs)    (Blank rows = not tested)  SHOULDER SPECIAL TESTS: Impingement tests: Neer impingement test: positive , Hawkins/Kennedy impingement test: positive , and Painful arc test: positive   Empty can test: positive   JOINT MOBILITY TESTING:  PROM can get full ROM with pain, some muscle guarding  PALPATION:  TTP left shoulder   TODAY'S TREATMENT:  DATE:  01/14/23 UBE L3 x21mns Seated rows and lats 25# 2x10 OHP blue ball 2x10 PROM to L shoulder all directions, grade 3 posterior and inferior glides SA punches 3# 2x10 LUE Sidelying ER 3# 2x10 LUE Sidelying abduction 3# 2x10 LUE  Tricep ext 25# 2x10 Bicep curls 25# 2x10 Ice to L shoulder 569ms    01/09/23 UBE L2 x3 mins each way IR stretch with towel 5s holds x10 ER with green band 2x10 Shoulder ext 5# x10, 10# x10  Cable rows 10# 2x10 IYT 2# 2x10  OHP yellow ball 2x10 MH to L shoulder 1074m   01/07/23 UBE L2  x3 mins each way   Rows and ext red 2x10 Supine flexion 2# WaTE 2x10 Chest press 2# WaTE 2x10 PROM to L shoulder all directions ER/IR 2# 2x10 Horizontal abd green 2x10 Lateral raises 2# 2x10 OHP yellow 2x10 ICE:  ice pack L shoulder in sitting post treatment to assist with soreness x10m58m 01/02/23:  Manual:  supine for inferior glides, 60 sec bouts, 3 reps, with shoulder at 90 degrees abduction.   R Sidelying for tactile cues to isolate scapular motion , all planes Supine AAROM/ stretching, with GH distraction and medial L scapular glides against trunk with L shoulder flexion Supine contract /relax L shoulder IR stretch at 90 degrees abd Prone for PA mid, lower thoracic HVLA mobs, to improve thoracic extension with shoulder flexion  Therex:  supine isometric horizontal shoulder abduction with B flexion with strap 20x R side lying for L shoulder ER(pillow under axilla)  3 x 15 Seated for band B shoulder Er , horiz abd, and rows , yellow for Er, green for rows, all fast movements, low amplitude to increase endurance of postural musculature.  ICE:  ice pack L shoulder in sitting post treatment to assist with soreness  12/25/22- EVAL   PATIENT EDUCATION: Education details: POC and HEP Person educated: Patient Education method: Explanation Education comprehension: verbalized understanding  HOME EXERCISE PROGRAM: Access Code: [email or web address]: [email or web address] Date: 12/25/2022 Prepared by: MonaAndris Baumannercises - Shoulder Extension with Resistance  - 1 x daily - 7 x weekly - 2 sets - 10 reps - Standing Shoulder Row with Anchored Resistance  - 1 x daily - 7 x weekly - 2 sets - 10 reps - Shoulder External Rotation and Scapular Retraction with Resistance  - 1 x daily - 7 x weekly - 2 sets - 10 reps - Standing Shoulder Internal Rotation Stretch with Towel  - 1 x daily - 7 x weekly - 2 sets - 10 reps  ASSESSMENT:  CLINICAL IMPRESSION: Patient arrives late to appointment, states she  is having no pain. With PROM she is still limited into flexion and abduction and has pain at her end ranges. Does well with interventions.     OBJECTIVE IMPAIRMENTS: decreased ROM, decreased strength, impaired UE functional use, and pain.   ACTIVITY LIMITATIONS: carrying, lifting, and reach over head   REHAB POTENTIAL: Good  CLINICAL DECISION MAKING: Stable/uncomplicated  EVALUATION COMPLEXITY: Low  GOALS: Goals reviewed with patient? Yes  SHORT TERM GOALS: Target date: 01/22/23  Patient will be independent with initial HEP.  Goal status: INITIAL   LONG TERM GOALS: Target date: 02/26/23  Patient will be independent with advanced/ongoing HEP to improve outcomes and carryover.  Goal status: INITIAL  2.  Patient will report 75% improvement in L shoulder pain to improve QOL.  Baseline: 3/10 Goal status: INITIAL  3.  Patient to improve L shoulder AROM  to [facility] without pain provocation to allow for increased ease of ADLs.  Goal status: INITIAL  4.  Patient will demonstrate improved functional UE strength as demonstrated by 5/5. Goal status: INITIAL   PLAN:  PT FREQUENCY: 1-2x/week  PT DURATION: 8 weeks  PLANNED INTERVENTIONS: Therapeutic exercises, Therapeutic activity, Neuromuscular re-education, Balance training, Gait training, Patient/Family education, Self Care, Joint mobilization, Dry Needling, Electrical stimulation, Cryotherapy, Moist heat, Vasopneumatic device, Ionotophoresis 40m/ml Dexamethasone, and Manual therapy  PLAN FOR NEXT SESSION:  L shoulder ROM and strengthening, PROM   MAndris Baumann PT 01/14/2023, 10:21 AM

## 2023-01-16 ENCOUNTER — Other Ambulatory Visit: Payer: Self-pay

## 2023-01-16 ENCOUNTER — Ambulatory Visit: Payer: Medicaid Other

## 2023-01-16 DIAGNOSIS — M25622 Stiffness of left elbow, not elsewhere classified: Secondary | ICD-10-CM

## 2023-01-16 DIAGNOSIS — M6281 Muscle weakness (generalized): Secondary | ICD-10-CM

## 2023-01-16 DIAGNOSIS — M25512 Pain in left shoulder: Secondary | ICD-10-CM

## 2023-01-16 DIAGNOSIS — R252 Cramp and spasm: Secondary | ICD-10-CM

## 2023-01-16 DIAGNOSIS — R293 Abnormal posture: Secondary | ICD-10-CM

## 2023-01-16 DIAGNOSIS — M25612 Stiffness of left shoulder, not elsewhere classified: Secondary | ICD-10-CM

## 2023-01-16 DIAGNOSIS — R600 Localized edema: Secondary | ICD-10-CM

## 2023-01-16 DIAGNOSIS — G8929 Other chronic pain: Secondary | ICD-10-CM

## 2023-01-16 NOTE — Therapy (Signed)
OUTPATIENT PHYSICAL THERAPY SHOULDER Rx   Patient Name: Lynn Moody MRN: [id number] DOB:07-13-1971, 52 y.o., female Today's Date: 01/16/2023  END OF SESSION:  PT End of Session - 01/16/23 1148     Visit Number 6    Date for PT Re-Evaluation 02/26/23    Authorization Type [employer]    PT Start Time 1146    PT Stop Time 1230    PT Time Calculation (min) 44 min    Activity Tolerance Patient tolerated treatment well    Behavior During Therapy WFL for tasks assessed/performed               Past Medical History:  Diagnosis Date   Anemia    Diabetes mellitus without complication ([place])    Hypertension    Past Surgical History:  Procedure Laterality Date   BREAST BIOPSY     left breast with maker placed   CESAREAN SECTION     DENTAL SURGERY     ORIF ELBOW FRACTURE Left 05/11/2021   Procedure: OPEN REDUCTION INTERNAL FIXATION DISTAL HUMERUS FRACTURE;  Surgeon: Hiram Gash, MD;  Location: [place];  Service: Orthopedics;  Laterality: Left;   SHOULDER ARTHROSCOPY WITH DISTAL CLAVICLE RESECTION Left 06/28/2021   Procedure: SHOULDER ARTHROSCOPY WITH DISTAL CLAVICLE RESECTION;  Surgeon: Hiram Gash, MD;  Location: [place];  Service: Orthopedics;  Laterality: Left;   SHOULDER ARTHROSCOPY WITH SUBACROMIAL DECOMPRESSION AND BICEP TENDON REPAIR Left 06/28/2021   Procedure: SHOULDER ARTHROSCOPY WITH SUBACROMIAL DECOMPRESSION; PARTIAL ACROMIOPLASTY WITH CORACOACROMIAL RELEASE AND  BICEP TENDODESIS;  Surgeon: Hiram Gash, MD;  Location: [place];  Service: Orthopedics;  Laterality: Left;   ULNAR NERVE TRANSPOSITION Left 05/11/2021   Procedure: RADIAL  NERVE DECOMPRESSION;  Surgeon: Hiram Gash, MD;  Location: [place];  Service: Orthopedics;  Laterality: Left;   Patient Active Problem List   Diagnosis Date Noted   History of miscarriage 08/19/2015   Elevated WBC count 05/18/2015   Pneumococcal vaccination declined  03/15/2015   Diabetes mellitus, type 2 ([place]) 12/23/2014   IMPAIRED GLUCOSE TOLERANCE TEST 10/13/2010    PCP: Precious Haws  REFERRING PROVIDER: Noemi Chapel  REFERRING DIAG: Lt Rotator Cuff Tendonitis  THERAPY DIAG:  Stiffness of left shoulder, not elsewhere classified  Chronic left shoulder pain  Muscle weakness (generalized)  Abnormal posture  Acute pain of left shoulder  Cramp and spasm  Stiffness of left elbow, not elsewhere classified  Localized edema  Rationale for Evaluation and Treatment: Rehabilitation  ONSET DATE: 06/19/21  SUBJECTIVE:  SUBJECTIVE STATEMENT: I was sore for like 2 days after last visit but I feel good today   PERTINENT HISTORY: DM, HTN, ORIF elbow fx 2022, shoulder arthroscopy w/clavicle resection and subacromial decompression and bicep tendon repair 2022 L  PAIN:  Are you having pain? Yes: NPRS scale: 2/10 Pain location: L shoulder radiates down to elbow, sometimes the R Pain description: pins and needles, shoulder is constant and dull Aggravating factors: movement, if the weather is bad  Relieving factors: pain meds, voltaren, hot showers  PRECAUTIONS: None  WEIGHT BEARING RESTRICTIONS: No  FALLS:  Has patient fallen in last 6 months? No  LIVING ENVIRONMENT: Lives with: lives with their family Lives in: House/apartment Stairs: Yes: Internal: 14 steps; on right going up Has following equipment at home: None  OCCUPATION: Teacher  PLOF: Independent  PATIENT GOALS: to have more range of motion with my arm and less pain   NEXT MD VISIT:   OBJECTIVE:   DIAGNOSTIC FINDINGS:  MRI on 12/18/22 was unremarkable  PATIENT SURVEYS:  Quick Dash 22.7%  COGNITION: Overall cognitive status: Within functional limits for tasks  assessed     SENSATION: WFL  POSTURE: Rounded shoulders  UPPER EXTREMITY ROM:   Active ROM Right eval Left eval  Shoulder flexion WFL slight pain at end range 145 with pain   Shoulder extension    Shoulder abduction WFL slight pain 110 when pain starts  Shoulder adduction    Shoulder internal rotation WFL 45 pain starts  Shoulder external rotation WFL 65 pain starts  Elbow flexion    Elbow extension    Wrist flexion    Wrist extension    Wrist ulnar deviation    Wrist radial deviation    Wrist pronation    Wrist supination    (Blank rows = not tested)  UPPER EXTREMITY MMT:  MMT Right eval Left eval  Shoulder flexion 3+ 3+  Shoulder extension    Shoulder abduction 4 3+  Shoulder adduction    Shoulder internal rotation 4+ 4  Shoulder external rotation 4 3+  Middle trapezius    Lower trapezius    Elbow flexion 5 5  Elbow extension 5 5  Wrist flexion    Wrist extension    Wrist ulnar deviation    Wrist radial deviation    Wrist pronation    Wrist supination    Grip strength (lbs)    (Blank rows = not tested)  SHOULDER SPECIAL TESTS: Impingement tests: Neer impingement test: positive , Hawkins/Kennedy impingement test: positive , and Painful arc test: positive   Empty can test: positive   JOINT MOBILITY TESTING:  PROM can get full ROM with pain, some muscle guarding  PALPATION:  TTP left shoulder   TODAY'S TREATMENT:  DATE:  01/16/23:   UBE L3 x64mns Seated rows and lats 25# 2x10 OHP blue ball 2x10 PROM to L shoulder all directions, grade 3 posterior and inferior glides 1 min bouts x 3 with L shoulder at 90 degrees abduction SA punches 3# 2x10 LUE Sidelying ER 3# 2x10 LUE Sidelying abduction 3# 2x10 LUE  Prone HVLA T 3 to 5  Tricep ext B 25# 2x10 Bicep curls 25# 2x10 Standing PNF /pec flys pulley 5# 10x 2 Ice to  L shoulder 551ms   01/14/23 UBE L3 x4m30m Seated rows and lats 25# 2x10 OHP blue ball 2x10 PROM to L shoulder all directions, grade 3 posterior and inferior glides SA punches 3# 2x10 LUE Sidelying ER 3# 2x10 LUE Sidelying abduction 3# 2x10 LUE  Tricep ext 25# 2x10 Bicep curls 25# 2x10 Ice to L shoulder 5mi77m   01/09/23 UBE L2 x3 mins each way IR stretch with towel 5s holds x10 ER with green band 2x10 Shoulder ext 5# x10, 10# x10  Cable rows 10# 2x10 IYT 2# 2x10  OHP yellow ball 2x10 MH to L shoulder 10mi98m 01/07/23 UBE L2  x3 mins each way  Rows and ext red 2x10 Supine flexion 2# WaTE 2x10 Chest press 2# WaTE 2x10 PROM to L shoulder all directions ER/IR 2# 2x10 Horizontal abd green 2x10 Lateral raises 2# 2x10 OHP yellow 2x10 ICE:  ice pack L shoulder in sitting post treatment to assist with soreness x10min48m/7/24:  Manual:  supine for inferior glides, 60 sec bouts, 3 reps, with shoulder at 90 degrees abduction.   R Sidelying for tactile cues to isolate scapular motion , all planes Supine AAROM/ stretching, with GH distraction and medial L scapular glides against trunk with L shoulder flexion Supine contract /relax L shoulder IR stretch at 90 degrees abd Prone for PA mid, lower thoracic HVLA mobs, to improve thoracic extension with shoulder flexion  Therex:  supine isometric horizontal shoulder abduction with B flexion with strap 20x R side lying for L shoulder ER(pillow under axilla)  3 x 15 Seated for band B shoulder Er , horiz abd, and rows , yellow for Er, green for rows, all fast movements, low amplitude to increase endurance of postural musculature.  ICE:  ice pack L shoulder in sitting post treatment to assist with soreness  12/25/22- EVAL   PATIENT EDUCATION: Education details: POC and HEP Person educated: Patient Education method: Explanation Education comprehension: verbalized understanding  HOME EXERCISE PROGRAM: Access Code: [email or web address] Date: 12/25/2022 Prepared by: Mona SAndris Baumanncises - Shoulder Extension with Resistance  - 1 x daily - 7 x weekly - 2 sets - 10 reps - Standing Shoulder Row with Anchored Resistance  - 1 x daily - 7 x weekly - 2 sets - 10 reps - Shoulder External Rotation and Scapular Retraction with Resistance  - 1 x daily - 7 x weekly - 2 sets - 10 reps - Standing Shoulder Internal Rotation Stretch with Towel  - 1 x daily - 7 x weekly - 2 sets - 10 reps  ASSESSMENT:  CLINICAL IMPRESSION: Nearly symmetrical motion active B shoulders, somewhat limited B shoulder elevation .  Tolerated therex well, we discussed possibly spacing visits apart some to allow for 2-3 day recovery time in between sessions.  Overall much improved with all flexibility and strength.     OBJECTIVE IMPAIRMENTS: decreased ROM, decreased strength, impaired UE functional use, and pain.   ACTIVITY LIMITATIONS: carrying, lifting, and reach  over head   REHAB POTENTIAL: Good  CLINICAL DECISION MAKING: Stable/uncomplicated  EVALUATION COMPLEXITY: Low  GOALS: Goals reviewed with patient? Yes  SHORT TERM GOALS: Target date: 01/22/23  Patient will be independent with initial HEP.  Goal status: INITIAL   LONG TERM GOALS: Target date: 02/26/23  Patient will be independent with advanced/ongoing HEP to improve outcomes and carryover.  Goal status: IN PROGRESS  2.  Patient will report 75% improvement in L shoulder pain to improve QOL.  Baseline: 3/10 Goal status: IN PROGRESS  3.  Patient to improve L shoulder AROM to [facility] without pain provocation to allow for increased ease of ADLs.  Goal status: IN PROGRESS  4.  Patient will demonstrate improved functional UE strength as demonstrated by 5/5. Goal status: IN PROGRESS   PLAN:  PT FREQUENCY: 1-2x/week  PT DURATION: 8 weeks  PLANNED INTERVENTIONS: Therapeutic exercises, Therapeutic activity, Neuromuscular re-education, Balance training,  Gait training, Patient/Family education, Self Care, Joint mobilization, Dry Needling, Electrical stimulation, Cryotherapy, Moist heat, Vasopneumatic device, Ionotophoresis 29m/ml Dexamethasone, and Manual therapy  PLAN FOR NEXT SESSION:  L shoulder ROM and strengthening, PROM   Daeshawn Redmann L Javarion Douty, PT 01/16/2023, 12:32 PM

## 2023-01-21 ENCOUNTER — Ambulatory Visit: Payer: Medicaid Other

## 2023-01-21 ENCOUNTER — Other Ambulatory Visit: Payer: Self-pay

## 2023-01-21 DIAGNOSIS — M6281 Muscle weakness (generalized): Secondary | ICD-10-CM

## 2023-01-21 DIAGNOSIS — M25612 Stiffness of left shoulder, not elsewhere classified: Secondary | ICD-10-CM

## 2023-01-21 DIAGNOSIS — R252 Cramp and spasm: Secondary | ICD-10-CM

## 2023-01-21 DIAGNOSIS — G8929 Other chronic pain: Secondary | ICD-10-CM

## 2023-01-21 DIAGNOSIS — R293 Abnormal posture: Secondary | ICD-10-CM

## 2023-01-21 DIAGNOSIS — M25512 Pain in left shoulder: Secondary | ICD-10-CM

## 2023-01-21 NOTE — Therapy (Signed)
OUTPATIENT PHYSICAL THERAPY SHOULDER Rx   Patient Name: Lynn Moody MRN: [id number] DOB:30-Jun-1971, 52 y.o., female Today's Date: 01/21/2023  END OF SESSION:  PT End of Session - 01/21/23 1100     Visit Number 7    Date for PT Re-Evaluation 02/26/23    Authorization Type [employer]    PT Start Time 1100    PT Stop Time 1145    PT Time Calculation (min) 45 min    Activity Tolerance Patient tolerated treatment well    Behavior During Therapy WFL for tasks assessed/performed                Past Medical History:  Diagnosis Date   Anemia    Diabetes mellitus without complication ([place])    Hypertension    Past Surgical History:  Procedure Laterality Date   BREAST BIOPSY     left breast with maker placed   CESAREAN SECTION     DENTAL SURGERY     ORIF ELBOW FRACTURE Left 05/11/2021   Procedure: OPEN REDUCTION INTERNAL FIXATION DISTAL HUMERUS FRACTURE;  Surgeon: Hiram Gash, MD;  Location: [place];  Service: Orthopedics;  Laterality: Left;   SHOULDER ARTHROSCOPY WITH DISTAL CLAVICLE RESECTION Left 06/28/2021   Procedure: SHOULDER ARTHROSCOPY WITH DISTAL CLAVICLE RESECTION;  Surgeon: Hiram Gash, MD;  Location: [place];  Service: Orthopedics;  Laterality: Left;   SHOULDER ARTHROSCOPY WITH SUBACROMIAL DECOMPRESSION AND BICEP TENDON REPAIR Left 06/28/2021   Procedure: SHOULDER ARTHROSCOPY WITH SUBACROMIAL DECOMPRESSION; PARTIAL ACROMIOPLASTY WITH CORACOACROMIAL RELEASE AND  BICEP TENDODESIS;  Surgeon: Hiram Gash, MD;  Location: [place];  Service: Orthopedics;  Laterality: Left;   ULNAR NERVE TRANSPOSITION Left 05/11/2021   Procedure: RADIAL  NERVE DECOMPRESSION;  Surgeon: Hiram Gash, MD;  Location: [place];  Service: Orthopedics;  Laterality: Left;   Patient Active Problem List   Diagnosis Date Noted   History of miscarriage 08/19/2015   Elevated WBC count 05/18/2015   Pneumococcal vaccination  declined 03/15/2015   Diabetes mellitus, type 2 ([place]) 12/23/2014   IMPAIRED GLUCOSE TOLERANCE TEST 10/13/2010    PCP: Precious Haws  REFERRING PROVIDER: Noemi Chapel  REFERRING DIAG: Lt Rotator Cuff Tendonitis  THERAPY DIAG:  Stiffness of left shoulder, not elsewhere classified  Chronic left shoulder pain  Muscle weakness (generalized)  Abnormal posture  Cramp and spasm  Acute pain of left shoulder  Rationale for Evaluation and Treatment: Rehabilitation  ONSET DATE: 06/19/21  SUBJECTIVE:  SUBJECTIVE STATEMENT: I was sore for like 2 days after last visit but I feel good today   PERTINENT HISTORY: DM, HTN, ORIF elbow fx 2022, shoulder arthroscopy w/clavicle resection and subacromial decompression and bicep tendon repair 2022 L  PAIN:  Are you having pain? Yes: NPRS scale: 5/10 Pain location: L shoulder radiates down to elbow, sometimes the R Pain description: pins and needles, shoulder is constant and dull Aggravating factors: movement, if the weather is bad  Relieving factors: pain meds, voltaren, hot showers  PRECAUTIONS: None  WEIGHT BEARING RESTRICTIONS: No  FALLS:  Has patient fallen in last 6 months? No  LIVING ENVIRONMENT: Lives with: lives with their family Lives in: House/apartment Stairs: Yes: Internal: 14 steps; on right going up Has following equipment at home: None  OCCUPATION: Teacher  PLOF: Independent  PATIENT GOALS: to have more range of motion with my arm and less pain   NEXT MD VISIT:   OBJECTIVE:   DIAGNOSTIC FINDINGS:  MRI on 12/18/22 was unremarkable  PATIENT SURVEYS:  Quick Dash 22.7%  COGNITION: Overall cognitive status: Within functional limits for tasks assessed     SENSATION: WFL  POSTURE: Rounded shoulders  UPPER EXTREMITY ROM:    Active ROM Right eval Left eval  Shoulder flexion WFL slight pain at end range 145 with pain   Shoulder extension    Shoulder abduction WFL slight pain 110 when pain starts  Shoulder adduction    Shoulder internal rotation WFL 45 pain starts  Shoulder external rotation WFL 65 pain starts  Elbow flexion    Elbow extension    Wrist flexion    Wrist extension    Wrist ulnar deviation    Wrist radial deviation    Wrist pronation    Wrist supination    (Blank rows = not tested)  UPPER EXTREMITY MMT:  MMT Right eval Left eval  Shoulder flexion 3+ 3+  Shoulder extension    Shoulder abduction 4 3+  Shoulder adduction    Shoulder internal rotation 4+ 4  Shoulder external rotation 4 3+  Middle trapezius    Lower trapezius    Elbow flexion 5 5  Elbow extension 5 5  Wrist flexion    Wrist extension    Wrist ulnar deviation    Wrist radial deviation    Wrist pronation    Wrist supination    Grip strength (lbs)    (Blank rows = not tested)  SHOULDER SPECIAL TESTS: Impingement tests: Neer impingement test: positive , Hawkins/Kennedy impingement test: positive , and Painful arc test: positive   Empty can test: positive   JOINT MOBILITY TESTING:  PROM can get full ROM with pain, some muscle guarding  PALPATION:  TTP left shoulder   TODAY'S TREATMENT:  DATE:  01/21/23:  Observed B shoulder flexion, abduction, hand behind back:lacks 15 degrees L compared to R  UBE L1, 4 min Manual:   Prone for HVLA mobs T 5 Attempted cervicothoracic jxn without success Supine for 3 bouts of 60 gr 4 oscillations for inf/post GH jt glides with shoulder at 90 degrees abd Supine contract/ relax for L shoulder IR stretch at 90 degrees abduction  Therapeutic exercise: Supine sustained isometric horizontal B shoulder abduction with strap, while performing B  shoulder flexion, for motor control/ muscle energy, performed pain free , therapist stabilized scapular against trunk throughout motion 18 reps Seated for isotonic B shoulder rows, 25#, 2 sets 15 Seated lat pul downs, 15#, 15 reps Seated chest press with serratus push at end 2 sets 10 10 reps Prone for horizontal abduction L shoulder, 15 reps, to cue middle traps Prone for 90 degrees abduction with ER 15 reps  Seated for ice L shoulder 8 min post Rx  01/16/23:   UBE L3 x62mns Seated rows and lats 25# 2x10 OHP blue ball 2x10 PROM to L shoulder all directions, grade 3 posterior and inferior glides 1 min bouts x 3 with L shoulder at 90 degrees abduction SA punches 3# 2x10 LUE Sidelying ER 3# 2x10 LUE Sidelying abduction 3# 2x10 LUE  Prone HVLA T 3 to 5  Tricep ext B 25# 2x10 Bicep curls 25# 2x10 Standing PNF /pec flys pulley 5# 10x 2 Ice to L shoulder 524ms   01/14/23 UBE L3 x4m81m Seated rows and lats 25# 2x10 OHP blue ball 2x10 PROM to L shoulder all directions, grade 3 posterior and inferior glides SA punches 3# 2x10 LUE Sidelying ER 3# 2x10 LUE Sidelying abduction 3# 2x10 LUE  Tricep ext 25# 2x10 Bicep curls 25# 2x10 Ice to L shoulder 5mi85m   01/09/23 UBE L2 x3 mins each way IR stretch with towel 5s holds x10 ER with green band 2x10 Shoulder ext 5# x10, 10# x10  Cable rows 10# 2x10 IYT 2# 2x10  OHP yellow ball 2x10 MH to L shoulder 10mi21m 01/07/23 UBE L2  x3 mins each way  Rows and ext red 2x10 Supine flexion 2# WaTE 2x10 Chest press 2# WaTE 2x10 PROM to L shoulder all directions ER/IR 2# 2x10 Horizontal abd green 2x10 Lateral raises 2# 2x10 OHP yellow 2x10 ICE:  ice pack L shoulder in sitting post treatment to assist with soreness x10min77m/7/24:  Manual:  supine for inferior glides, 60 sec bouts, 3 reps, with shoulder at 90 degrees abduction.   R Sidelying for tactile cues to isolate scapular motion , all planes Supine AAROM/ stretching, with GH  distraction and medial L scapular glides against trunk with L shoulder flexion Supine contract /relax L shoulder IR stretch at 90 degrees abd Prone for PA mid, lower thoracic HVLA mobs, to improve thoracic extension with shoulder flexion  Therex:  supine isometric horizontal shoulder abduction with B flexion with strap 20x R side lying for L shoulder ER(pillow under axilla)  3 x 15 Seated for band B shoulder Er , horiz abd, and rows , yellow for Er, green for rows, all fast movements, low amplitude to increase endurance of postural musculature.  ICE:  ice pack L shoulder in sitting post treatment to assist with soreness  12/25/22- EVAL   PATIENT EDUCATION: Education details: POC and HEP Person educated: Patient Education method: Explanation Education comprehension: verbalized understanding  HOME EXERCISE PROGRAM: Access Code: [email or web address] Date: 12/25/2022 Prepared by:  Mona Sajjad  Exercises - Shoulder Extension with Resistance  - 1 x daily - 7 x weekly - 2 sets - 10 reps - Standing Shoulder Row with Anchored Resistance  - 1 x daily - 7 x weekly - 2 sets - 10 reps - Shoulder External Rotation and Scapular Retraction with Resistance  - 1 x daily - 7 x weekly - 2 sets - 10 reps - Standing Shoulder Internal Rotation Stretch with Towel  - 1 x daily - 7 x weekly - 2 sets - 10 reps  ASSESSMENT:  CLINICAL IMPRESSION: More sore L shoulder today, states she assisted someone with moving furniture, boxes this weekend. Also we discussed again spacing out her appts. To allow for recovery between sessions. Focused on regaining good scapular motor control and pain free, movements avoiding replication of impingement mechanics.  OBJECTIVE IMPAIRMENTS: decreased ROM, decreased strength, impaired UE functional use, and pain.   ACTIVITY LIMITATIONS: carrying, lifting, and reach over head   REHAB POTENTIAL: Good  CLINICAL DECISION MAKING:  Stable/uncomplicated  EVALUATION COMPLEXITY: Low  GOALS: Goals reviewed with patient? Yes  SHORT TERM GOALS: Target date: 01/22/23  Patient will be independent with initial HEP.  Goal status: INITIAL   LONG TERM GOALS: Target date: 02/26/23  Patient will be independent with advanced/ongoing HEP to improve outcomes and carryover.  Goal status: IN PROGRESS  2.  Patient will report 75% improvement in L shoulder pain to improve QOL.  Baseline: 3/10 Goal status: IN PROGRESS  3.  Patient to improve L shoulder AROM to [facility] without pain provocation to allow for increased ease of ADLs.  Goal status: IN PROGRESS  4.  Patient will demonstrate improved functional UE strength as demonstrated by 5/5. Goal status: IN PROGRESS   PLAN:  PT FREQUENCY: 1-2x/week  PT DURATION: 8 weeks  PLANNED INTERVENTIONS: Therapeutic exercises, Therapeutic activity, Neuromuscular re-education, Balance training, Gait training, Patient/Family education, Self Care, Joint mobilization, Dry Needling, Electrical stimulation, Cryotherapy, Moist heat, Vasopneumatic device, Ionotophoresis '4mg'$ /ml Dexamethasone, and Manual therapy  PLAN FOR NEXT SESSION:  L shoulder ROM and strengthening, PROM   Louanna Vanliew L Brionna Romanek, PT 01/21/2023, 11:51 AM

## 2023-01-23 ENCOUNTER — Ambulatory Visit: Payer: Medicaid Other | Admitting: Physical Therapy

## 2023-01-23 ENCOUNTER — Encounter: Payer: Self-pay | Admitting: Physical Therapy

## 2023-01-23 DIAGNOSIS — G8929 Other chronic pain: Secondary | ICD-10-CM

## 2023-01-23 DIAGNOSIS — M6281 Muscle weakness (generalized): Secondary | ICD-10-CM

## 2023-01-23 DIAGNOSIS — M25512 Pain in left shoulder: Secondary | ICD-10-CM

## 2023-01-23 DIAGNOSIS — R293 Abnormal posture: Secondary | ICD-10-CM

## 2023-01-23 DIAGNOSIS — R252 Cramp and spasm: Secondary | ICD-10-CM

## 2023-01-23 DIAGNOSIS — M25612 Stiffness of left shoulder, not elsewhere classified: Secondary | ICD-10-CM | POA: Diagnosis not present

## 2023-01-23 NOTE — Therapy (Signed)
OUTPATIENT PHYSICAL THERAPY SHOULDER Rx   Patient Name: Lynn Moody MRN: [id number] DOB:1971/10/19, 52 y.o., female Today's Date: 01/23/2023  END OF SESSION:  PT End of Session - 01/23/23 1507     Visit Number 8    Date for PT Re-Evaluation 02/26/23    PT Start Time 1503    PT Stop Time 1543    PT Time Calculation (min) 40 min                Past Medical History:  Diagnosis Date   Anemia    Diabetes mellitus without complication ([place])    Hypertension    Past Surgical History:  Procedure Laterality Date   BREAST BIOPSY     left breast with maker placed   CESAREAN SECTION     DENTAL SURGERY     ORIF ELBOW FRACTURE Left 05/11/2021   Procedure: OPEN REDUCTION INTERNAL FIXATION DISTAL HUMERUS FRACTURE;  Surgeon: Hiram Gash, MD;  Location: [place];  Service: Orthopedics;  Laterality: Left;   SHOULDER ARTHROSCOPY WITH DISTAL CLAVICLE RESECTION Left 06/28/2021   Procedure: SHOULDER ARTHROSCOPY WITH DISTAL CLAVICLE RESECTION;  Surgeon: Hiram Gash, MD;  Location: [place];  Service: Orthopedics;  Laterality: Left;   SHOULDER ARTHROSCOPY WITH SUBACROMIAL DECOMPRESSION AND BICEP TENDON REPAIR Left 06/28/2021   Procedure: SHOULDER ARTHROSCOPY WITH SUBACROMIAL DECOMPRESSION; PARTIAL ACROMIOPLASTY WITH CORACOACROMIAL RELEASE AND  BICEP TENDODESIS;  Surgeon: Hiram Gash, MD;  Location: [place];  Service: Orthopedics;  Laterality: Left;   ULNAR NERVE TRANSPOSITION Left 05/11/2021   Procedure: RADIAL  NERVE DECOMPRESSION;  Surgeon: Hiram Gash, MD;  Location: [place];  Service: Orthopedics;  Laterality: Left;   Patient Active Problem List   Diagnosis Date Noted   History of miscarriage 08/19/2015   Elevated WBC count 05/18/2015   Pneumococcal vaccination declined 03/15/2015   Diabetes mellitus, type 2 ([place]) 12/23/2014   IMPAIRED GLUCOSE TOLERANCE TEST 10/13/2010    PCP: Precious Haws  REFERRING PROVIDER:  Noemi Chapel  REFERRING DIAG: Lt Rotator Cuff Tendonitis  THERAPY DIAG:  Stiffness of left shoulder, not elsewhere classified  Chronic left shoulder pain  Muscle weakness (generalized)  Acute pain of left shoulder  Cramp and spasm  Abnormal posture  Rationale for Evaluation and Treatment: Rehabilitation  ONSET DATE: 06/19/21  SUBJECTIVE:                                                                                                                                                                                      SUBJECTIVE STATEMENT: Patient reports that her shoulder is still sore down to the elbow. Her treatment  on Monday did not aggravate. Achy, but also sharp, like a sore muscle.  PERTINENT HISTORY: DM, HTN, ORIF elbow fx 2022, shoulder arthroscopy w/clavicle resection and subacromial decompression and bicep tendon repair 2022 L  PAIN:  Are you having pain? Yes: NPRS scale: 5/10 Pain location: L shoulder radiates down to elbow, sometimes the R Pain description: pins and needles, shoulder is constant and dull Aggravating factors: movement, if the weather is bad  Relieving factors: pain meds, voltaren, hot showers  PRECAUTIONS: None  WEIGHT BEARING RESTRICTIONS: No  FALLS:  Has patient fallen in last 6 months? No  LIVING ENVIRONMENT: Lives with: lives with their family Lives in: House/apartment Stairs: Yes: Internal: 14 steps; on right going up Has following equipment at home: None  OCCUPATION: Teacher  PLOF: Independent  PATIENT GOALS: to have more range of motion with my arm and less pain   NEXT MD VISIT:   OBJECTIVE:   DIAGNOSTIC FINDINGS:  MRI on 12/18/22 was unremarkable  PATIENT SURVEYS:  Quick Dash 22.7%  COGNITION: Overall cognitive status: Within functional limits for tasks assessed     SENSATION: WFL  POSTURE: Rounded shoulders  UPPER EXTREMITY ROM:   Active ROM Right eval Left eval  Shoulder flexion WFL slight pain at end  range 145 with pain   Shoulder extension    Shoulder abduction WFL slight pain 110 when pain starts  Shoulder adduction    Shoulder internal rotation WFL 45 pain starts  Shoulder external rotation WFL 65 pain starts  Elbow flexion    Elbow extension    Wrist flexion    Wrist extension    Wrist ulnar deviation    Wrist radial deviation    Wrist pronation    Wrist supination    (Blank rows = not tested)  UPPER EXTREMITY MMT:  MMT Right eval Left eval  Shoulder flexion 3+ 3+  Shoulder extension    Shoulder abduction 4 3+  Shoulder adduction    Shoulder internal rotation 4+ 4  Shoulder external rotation 4 3+  Middle trapezius    Lower trapezius    Elbow flexion 5 5  Elbow extension 5 5  Wrist flexion    Wrist extension    Wrist ulnar deviation    Wrist radial deviation    Wrist pronation    Wrist supination    Grip strength (lbs)    (Blank rows = not tested)  SHOULDER SPECIAL TESTS: Impingement tests: Neer impingement test: positive , Hawkins/Kennedy impingement test: positive , and Painful arc test: positive   Empty can test: positive   JOINT MOBILITY TESTING:  PROM can get full ROM with pain, some muscle guarding  PALPATION:  TTP left shoulder   TODAY'S TREATMENT:  DATE:  01/23/23 UBE L2-3 x 2 min forward and 2 minutes back STM to L pects, with CFM to supraspinatus and biceps tendons Joint mobs, inferior and post glide, Grade III-IV, 2 x 10 on L Scapular mobilization Scapular stabilization exercises- Shoulder ext, rows, against 10#, ER against 5# resistance, 2 x 10 reps each Ball pushes against wall in square pattern, 5 x in each direction. Shoulder flex serratus strengthening with yellow Tband at wrists x 10 CP to L shoulder x 10 minutes for pain and inflammation relief.  01/21/23:  Observed B shoulder flexion, abduction,  hand behind back:lacks 15 degrees L compared to R  UBE L1, 4 min Manual:   Prone for HVLA mobs T 5 Attempted cervicothoracic jxn without success Supine for 3 bouts of 60 gr 4 oscillations for inf/post GH jt glides with shoulder at 90 degrees abd Supine contract/ relax for L shoulder IR stretch at 90 degrees abduction  Therapeutic exercise: Supine sustained isometric horizontal B shoulder abduction with strap, while performing B shoulder flexion, for motor control/ muscle energy, performed pain free , therapist stabilized scapular against trunk throughout motion 18 reps Seated for isotonic B shoulder rows, 25#, 2 sets 15 Seated lat pul downs, 15#, 15 reps Seated chest press with serratus push at end 2 sets 10 10 reps Prone for horizontal abduction L shoulder, 15 reps, to cue middle traps Prone for 90 degrees abduction with ER 15 reps  Seated for ice L shoulder 8 min post Rx  01/16/23:   UBE L3 x53mns Seated rows and lats 25# 2x10 OHP blue ball 2x10 PROM to L shoulder all directions, grade 3 posterior and inferior glides 1 min bouts x 3 with L shoulder at 90 degrees abduction SA punches 3# 2x10 LUE Sidelying ER 3# 2x10 LUE Sidelying abduction 3# 2x10 LUE  Prone HVLA T 3 to 5  Tricep ext B 25# 2x10 Bicep curls 25# 2x10 Standing PNF /pec flys pulley 5# 10x 2 Ice to L shoulder 5104ms   01/14/23 UBE L3 x4m18m Seated rows and lats 25# 2x10 OHP blue ball 2x10 PROM to L shoulder all directions, grade 3 posterior and inferior glides SA punches 3# 2x10 LUE Sidelying ER 3# 2x10 LUE Sidelying abduction 3# 2x10 LUE  Tricep ext 25# 2x10 Bicep curls 25# 2x10 Ice to L shoulder 5mi23m   01/09/23 UBE L2 x3 mins each way IR stretch with towel 5s holds x10 ER with green band 2x10 Shoulder ext 5# x10, 10# x10  Cable rows 10# 2x10 IYT 2# 2x10  OHP yellow ball 2x10 MH to L shoulder 10mi2m 01/07/23 UBE L2  x3 mins each way  Rows and ext red 2x10 Supine flexion 2# WaTE 2x10 Chest press  2# WaTE 2x10 PROM to L shoulder all directions ER/IR 2# 2x10 Horizontal abd green 2x10 Lateral raises 2# 2x10 OHP yellow 2x10 ICE:  ice pack L shoulder in sitting post treatment to assist with soreness x10min65m/7/24:  Manual:  supine for inferior glides, 60 sec bouts, 3 reps, with shoulder at 90 degrees abduction.   R Sidelying for tactile cues to isolate scapular motion , all planes Supine AAROM/ stretching, with GH distraction and medial L scapular glides against trunk with L shoulder flexion Supine contract /relax L shoulder IR stretch at 90 degrees abd Prone for PA mid, lower thoracic HVLA mobs, to improve thoracic extension with shoulder flexion  Therex:  supine isometric horizontal shoulder abduction with B flexion with strap 20x R side  lying for L shoulder ER(pillow under axilla)  3 x 15 Seated for band B shoulder Er , horiz abd, and rows , yellow for Er, green for rows, all fast movements, low amplitude to increase endurance of postural musculature.  ICE:  ice pack L shoulder in sitting post treatment to assist with soreness  12/25/22- EVAL   PATIENT EDUCATION: Education details: POC and HEP Person educated: Patient Education method: Explanation Education comprehension: verbalized understanding  HOME EXERCISE PROGRAM: Access Code: [email or web address] URL: [email or web address] Date: 12/25/2022 Prepared by: Andris Baumann  Exercises - Shoulder Extension with Resistance  - 1 x daily - 7 x weekly - 2 sets - 10 reps - Standing Shoulder Row with Anchored Resistance  - 1 x daily - 7 x weekly - 2 sets - 10 reps - Shoulder External Rotation and Scapular Retraction with Resistance  - 1 x daily - 7 x weekly - 2 sets - 10 reps - Standing Shoulder Internal Rotation Stretch with Towel  - 1 x daily - 7 x weekly - 2 sets - 10 reps  ASSESSMENT:  CLINICAL IMPRESSION: Patient reports continued pain in her L shoulder today. TTP L supraspinatus and long biceps tendons, remains very  tight in upper traps B. Provided CFM, additional STM followed by joint mobs and stretch to shoulder muscles F/B scapular stabilization exercises. Finished with ice to shoulder for anti inflammation and pain relief.  OBJECTIVE IMPAIRMENTS: decreased ROM, decreased strength, impaired UE functional use, and pain.   ACTIVITY LIMITATIONS: carrying, lifting, and reach over head   REHAB POTENTIAL: Good  CLINICAL DECISION MAKING: Stable/uncomplicated  EVALUATION COMPLEXITY: Low  GOALS: Goals reviewed with patient? Yes  SHORT TERM GOALS: Target date: 01/22/23  Patient will be independent with initial HEP.  Goal status: INITIAL   LONG TERM GOALS: Target date: 02/26/23  Patient will be independent with advanced/ongoing HEP to improve outcomes and carryover.  Goal status: IN PROGRESS  2.  Patient will report 75% improvement in L shoulder pain to improve QOL.  Baseline: 3/10 Goal status: IN PROGRESS  3.  Patient to improve L shoulder AROM to [facility] without pain provocation to allow for increased ease of ADLs.  Goal status: IN PROGRESS  4.  Patient will demonstrate improved functional UE strength as demonstrated by 5/5. Goal status: IN PROGRESS   PLAN:  PT FREQUENCY: 1-2x/week  PT DURATION: 8 weeks  PLANNED INTERVENTIONS: Therapeutic exercises, Therapeutic activity, Neuromuscular re-education, Balance training, Gait training, Patient/Family education, Self Care, Joint mobilization, Dry Needling, Electrical stimulation, Cryotherapy, Moist heat, Vasopneumatic device, Ionotophoresis '4mg'$ /ml Dexamethasone, and Manual therapy  PLAN FOR NEXT SESSION:  L shoulder ROM and strengthening, PROM   Ethel Rana DPT 01/23/23 3:42 PM

## 2023-01-28 ENCOUNTER — Ambulatory Visit: Payer: Medicaid Other | Attending: Physician Assistant

## 2023-01-28 DIAGNOSIS — G8929 Other chronic pain: Secondary | ICD-10-CM | POA: Insufficient documentation

## 2023-01-28 DIAGNOSIS — M25512 Pain in left shoulder: Secondary | ICD-10-CM | POA: Diagnosis present

## 2023-01-28 DIAGNOSIS — R252 Cramp and spasm: Secondary | ICD-10-CM | POA: Diagnosis present

## 2023-01-28 DIAGNOSIS — M25612 Stiffness of left shoulder, not elsewhere classified: Secondary | ICD-10-CM | POA: Diagnosis present

## 2023-01-28 DIAGNOSIS — M6281 Muscle weakness (generalized): Secondary | ICD-10-CM | POA: Diagnosis not present

## 2023-01-28 DIAGNOSIS — R293 Abnormal posture: Secondary | ICD-10-CM | POA: Insufficient documentation

## 2023-01-28 NOTE — Therapy (Signed)
OUTPATIENT PHYSICAL THERAPY SHOULDER Rx   Patient Name: Lynn Moody MRN: [id number] DOB:1971/09/06, 52 y.o., female Today's Date: 01/28/2023  END OF SESSION:  PT End of Session - 01/28/23 1524     Visit Number 9    Date for PT Re-Evaluation 02/26/23    PT Start Time 1524    PT Stop Time 1605    PT Time Calculation (min) 41 min                 Past Medical History:  Diagnosis Date   Anemia    Diabetes mellitus without complication ([place])    Hypertension    Past Surgical History:  Procedure Laterality Date   BREAST BIOPSY     left breast with maker placed   CESAREAN SECTION     DENTAL SURGERY     ORIF ELBOW FRACTURE Left 05/11/2021   Procedure: OPEN REDUCTION INTERNAL FIXATION DISTAL HUMERUS FRACTURE;  Surgeon: Hiram Gash, MD;  Location: [facility];  Service: Orthopedics;  Laterality: Left;   SHOULDER ARTHROSCOPY WITH DISTAL CLAVICLE RESECTION Left 06/28/2021   Procedure: SHOULDER ARTHROSCOPY WITH DISTAL CLAVICLE RESECTION;  Surgeon: Hiram Gash, MD;  Location: [facility];  Service: Orthopedics;  Laterality: Left;   SHOULDER ARTHROSCOPY WITH SUBACROMIAL DECOMPRESSION AND BICEP TENDON REPAIR Left 06/28/2021   Procedure: SHOULDER ARTHROSCOPY WITH SUBACROMIAL DECOMPRESSION; PARTIAL ACROMIOPLASTY WITH CORACOACROMIAL RELEASE AND  BICEP TENDODESIS;  Surgeon: Hiram Gash, MD;  Location: [facility];  Service: Orthopedics;  Laterality: Left;   ULNAR NERVE TRANSPOSITION Left 05/11/2021   Procedure: RADIAL  NERVE DECOMPRESSION;  Surgeon: Hiram Gash, MD;  Location: [facility];  Service: Orthopedics;  Laterality: Left;   Patient Active Problem List   Diagnosis Date Noted   History of miscarriage 08/19/2015   Elevated WBC count 05/18/2015   Pneumococcal vaccination declined 03/15/2015   Diabetes mellitus, type 2 ([place]) 12/23/2014   IMPAIRED GLUCOSE TOLERANCE TEST 10/13/2010    PCP: Precious Haws  REFERRING  PROVIDER: Noemi Chapel  REFERRING DIAG: Lt Rotator Cuff Tendonitis  THERAPY DIAG:  Chronic left shoulder pain  Stiffness of left shoulder, not elsewhere classified  Muscle weakness (generalized)  Rationale for Evaluation and Treatment: Rehabilitation  ONSET DATE: 06/19/21  SUBJECTIVE:                                                                                                                                                                                      SUBJECTIVE STATEMENT: The shoulder is not doing that good, it started hurting again last week and all week it was kind of rough. Been doing more activities than  I was before.   PERTINENT HISTORY: DM, HTN, ORIF elbow fx 2022, shoulder arthroscopy w/clavicle resection and subacromial decompression and bicep tendon repair 2022 L  PAIN:  Are you having pain? Yes: NPRS scale: 5/10 Pain location: L shoulder radiates down to elbow, sometimes the R Pain description: pins and needles, shoulder is constant and dull Aggravating factors: movement, if the weather is bad  Relieving factors: pain meds, voltaren, hot showers  PRECAUTIONS: None  WEIGHT BEARING RESTRICTIONS: No  FALLS:  Has patient fallen in last 6 months? No  LIVING ENVIRONMENT: Lives with: lives with their family Lives in: House/apartment Stairs: Yes: Internal: 14 steps; on right going up Has following equipment at home: None  OCCUPATION: Teacher  PLOF: Independent  PATIENT GOALS: to have more range of motion with my arm and less pain   NEXT MD VISIT:   OBJECTIVE:   DIAGNOSTIC FINDINGS:  MRI on 12/18/22 was unremarkable  PATIENT SURVEYS:  Quick Dash 22.7%  COGNITION: Overall cognitive status: Within functional limits for tasks assessed     SENSATION: WFL  POSTURE: Rounded shoulders  UPPER EXTREMITY ROM:   Active ROM Right eval Left eval  Shoulder flexion WFL slight pain at end range 145 with pain   Shoulder extension    Shoulder  abduction WFL slight pain 110 when pain starts  Shoulder adduction    Shoulder internal rotation WFL 45 pain starts  Shoulder external rotation WFL 65 pain starts  Elbow flexion    Elbow extension    Wrist flexion    Wrist extension    Wrist ulnar deviation    Wrist radial deviation    Wrist pronation    Wrist supination    (Blank rows = not tested)  UPPER EXTREMITY MMT:  MMT Right eval Left eval  Shoulder flexion 3+ 3+  Shoulder extension    Shoulder abduction 4 3+  Shoulder adduction    Shoulder internal rotation 4+ 4  Shoulder external rotation 4 3+  Middle trapezius    Lower trapezius    Elbow flexion 5 5  Elbow extension 5 5  Wrist flexion    Wrist extension    Wrist ulnar deviation    Wrist radial deviation    Wrist pronation    Wrist supination    Grip strength (lbs)    (Blank rows = not tested)  SHOULDER SPECIAL TESTS: Impingement tests: Neer impingement test: positive , Hawkins/Kennedy impingement test: positive , and Painful arc test: positive   Empty can test: positive   JOINT MOBILITY TESTING:  PROM can get full ROM with pain, some muscle guarding  PALPATION:  TTP left shoulder   TODAY'S TREATMENT:                                                                                                                                         DATE:  01/28/23 UBE L2 x66mns  Pec  stretch 30s  quickDash- 36.4% Seated rows and lats 20# 2x10 ER with green band 2x10  3 way reaches with red band x10 Seated thoracic ext over half foam roll x10 Cold pack to L shoulder 5 mins  Ionto patch to R L shoulder #1   01/23/23 UBE L2-3 x 2 min forward and 2 minutes back STM to L pects, with CFM to supraspinatus and biceps tendons Joint mobs, inferior and post glide, Grade III-IV, 2 x 10 on L Scapular mobilization Scapular stabilization exercises- Shoulder ext, rows, against 10#, ER against 5# resistance, 2 x 10 reps each Ball pushes gainst wall in square pattern, 5 x in  each direction. Shoulder flex serratus strengthening with yellow Tband at wrists x 10 CP to L shoulder x 10 minutes for pain and inflammation relief.  01/21/23:  Observed B shoulder flexion, abduction, hand behind back:lacks 15 degrees L compared to R  UBE L1, 4 min Manual:   Prone for HVLA mobs T 5 Attempted cervicothoracic jxn without success Supine for 3 bouts of 60 gr 4 oscillations for inf/post GH jt glides with shoulder at 90 degrees abd Supine contract/ relax for L shoulder IR stretch at 90 degrees abduction  Therapeutic exercise: Supine sustained isometric horizontal B shoulder abduction with strap, while performing B shoulder flexion, for motor control/ muscle energy, performed pain free , therapist stabilized scapular against trunk throughout motion 18 reps Seated for isotonic B shoulder rows, 25#, 2 sets 15 Seated lat pul downs, 15#, 15 reps Seated chest press with serratus push at end 2 sets 10 10 reps Prone for horizontal abduction L shoulder, 15 reps, to cue middle traps Prone for 90 degrees abduction with ER 15 reps  Seated for ice L shoulder 8 min post Rx  01/16/23:   UBE L3 x43mns Seated rows and lats 25# 2x10 OHP blue ball 2x10 PROM to L shoulder all directions, grade 3 posterior and inferior glides 1 min bouts x 3 with L shoulder at 90 degrees abduction SA punches 3# 2x10 LUE Sidelying ER 3# 2x10 LUE Sidelying abduction 3# 2x10 LUE  Prone HVLA T 3 to 5  Tricep ext B 25# 2x10 Bicep curls 25# 2x10 Standing PNF /pec flys pulley 5# 10x 2 Ice to L shoulder 558ms   01/14/23 UBE L3 x4m49m Seated rows and lats 25# 2x10 OHP blue ball 2x10 PROM to L shoulder all directions, grade 3 posterior and inferior glides SA punches 3# 2x10 LUE Sidelying ER 3# 2x10 LUE Sidelying abduction 3# 2x10 LUE  Tricep ext 25# 2x10 Bicep curls 25# 2x10 Ice to L shoulder 5mi44m   01/09/23 UBE L2 x3 mins each way IR stretch with towel 5s holds x10 ER with green band 2x10 Shoulder  ext 5# x10, 10# x10  Cable rows 10# 2x10 IYT 2# 2x10  OHP yellow ball 2x10 MH to L shoulder 10mi32m 01/07/23 UBE L2  x3 mins each way  Rows and ext red 2x10 Supine flexion 2# WaTE 2x10 Chest press 2# WaTE 2x10 PROM to L shoulder all directions ER/IR 2# 2x10 Horizontal abd green 2x10 Lateral raises 2# 2x10 OHP yellow 2x10 ICE:  ice pack L shoulder in sitting post treatment to assist with soreness x10min25m/7/24:  Manual:  supine for inferior glides, 60 sec bouts, 3 reps, with shoulder at 90 degrees abduction.   R Sidelying for tactile cues to isolate scapular motion , all planes Supine AAROM/ stretching, with GH distraction and medial L scapular glides against  trunk with L shoulder flexion Supine contract /relax L shoulder IR stretch at 90 degrees abd Prone for PA mid, lower thoracic HVLA mobs, to improve thoracic extension with shoulder flexion  Therex:  supine isometric horizontal shoulder abduction with B flexion with strap 20x R side lying for L shoulder ER(pillow under axilla)  3 x 15 Seated for band B shoulder Er , horiz abd, and rows , yellow for Er, green for rows, all fast movements, low amplitude to increase endurance of postural musculature.  ICE:  ice pack L shoulder in sitting post treatment to assist with soreness  12/25/22- EVAL   PATIENT EDUCATION: Education details: POC and HEP Person educated: Patient Education method: Explanation Education comprehension: verbalized understanding  HOME EXERCISE PROGRAM: Access Code: [email or web address] URL: [email or web address] Date: 12/25/2022 Prepared by: Andris Baumann  Exercises - Shoulder Extension with Resistance  - 1 x daily - 7 x weekly - 2 sets - 10 reps - Standing Shoulder Row with Anchored Resistance  - 1 x daily - 7 x weekly - 2 sets - 10 reps - Shoulder External Rotation and Scapular Retraction with Resistance  - 1 x daily - 7 x weekly - 2 sets - 10 reps - Standing Shoulder Internal Rotation Stretch  with Towel  - 1 x daily - 7 x weekly - 2 sets - 10 reps  ASSESSMENT:  CLINICAL IMPRESSION: Patient reports continued pain in her L shoulder today. We worked on some light strengthening of L shoulder. Has difficulty doing ER with 5# so we modified to do with band instead. Finished with ice to shoulder and an ionto patch for anti inflammation and pain relief.  OBJECTIVE IMPAIRMENTS: decreased ROM, decreased strength, impaired UE functional use, and pain.   ACTIVITY LIMITATIONS: carrying, lifting, and reach over head   REHAB POTENTIAL: Good  CLINICAL DECISION MAKING: Stable/uncomplicated  EVALUATION COMPLEXITY: Low  GOALS: Goals reviewed with patient? Yes  SHORT TERM GOALS: Target date: 01/22/23  Patient will be independent with initial HEP.  Goal status: MET   LONG TERM GOALS: Target date: 02/26/23  Patient will be independent with advanced/ongoing HEP to improve outcomes and carryover.  Goal status: IN PROGRESS  2.  Patient will report 75% improvement in L shoulder pain to improve QOL.  Baseline: 3/10 Goal status: IN PROGRESS  3.  Patient to improve L shoulder AROM to [facility] without pain provocation to allow for increased ease of ADLs.  Goal status: IN PROGRESS  4.  Patient will demonstrate improved functional UE strength as demonstrated by 5/5. Goal status: IN PROGRESS   PLAN:  PT FREQUENCY: 1-2x/week  PT DURATION: 8 weeks  PLANNED INTERVENTIONS: Therapeutic exercises, Therapeutic activity, Neuromuscular re-education, Balance training, Gait training, Patient/Family education, Self Care, Joint mobilization, Dry Needling, Electrical stimulation, Cryotherapy, Moist heat, Vasopneumatic device, Ionotophoresis '4mg'$ /ml Dexamethasone, and Manual therapy  PLAN FOR NEXT SESSION:  L shoulder ROM and strengthening, progress note   Ethel Rana DPT 01/28/23 4:07 PM

## 2023-02-06 ENCOUNTER — Ambulatory Visit: Payer: Medicaid Other

## 2023-02-06 ENCOUNTER — Other Ambulatory Visit: Payer: Self-pay

## 2023-02-06 DIAGNOSIS — R293 Abnormal posture: Secondary | ICD-10-CM

## 2023-02-06 DIAGNOSIS — M25512 Pain in left shoulder: Secondary | ICD-10-CM | POA: Diagnosis not present

## 2023-02-06 DIAGNOSIS — M6281 Muscle weakness (generalized): Secondary | ICD-10-CM

## 2023-02-06 DIAGNOSIS — R252 Cramp and spasm: Secondary | ICD-10-CM

## 2023-02-06 DIAGNOSIS — M25612 Stiffness of left shoulder, not elsewhere classified: Secondary | ICD-10-CM

## 2023-02-06 DIAGNOSIS — G8929 Other chronic pain: Secondary | ICD-10-CM

## 2023-02-06 NOTE — Therapy (Signed)
OUTPATIENT PHYSICAL THERAPY SHOULDER RX NOTE   See note below for Objective Data and Assessment of Progress/Goals.     Patient Name: Lynn Moody MRN: [id number] DOB:07-16-1971, 52 y.o., female Today's Date: 02/06/2023  END OF SESSION:  PT End of Session - 02/06/23 1021     Visit Number 10    Date for PT Re-Evaluation 02/26/23    Authorization Type [employer]    Authorization Time Period 12 visits by 09/14/21 then transition to HEP    PT Start Time 1018    PT Stop Time 1100    PT Time Calculation (min) 42 min                  Past Medical History:  Diagnosis Date   Anemia    Diabetes mellitus without complication ([place])    Hypertension    Past Surgical History:  Procedure Laterality Date   BREAST BIOPSY     left breast with maker placed   CESAREAN SECTION     DENTAL SURGERY     ORIF ELBOW FRACTURE Left 05/11/2021   Procedure: OPEN REDUCTION INTERNAL FIXATION DISTAL HUMERUS FRACTURE;  Surgeon: Hiram Gash, MD;  Location: [place];  Service: Orthopedics;  Laterality: Left;   SHOULDER ARTHROSCOPY WITH DISTAL CLAVICLE RESECTION Left 06/28/2021   Procedure: SHOULDER ARTHROSCOPY WITH DISTAL CLAVICLE RESECTION;  Surgeon: Hiram Gash, MD;  Location: [place];  Service: Orthopedics;  Laterality: Left;   SHOULDER ARTHROSCOPY WITH SUBACROMIAL DECOMPRESSION AND BICEP TENDON REPAIR Left 06/28/2021   Procedure: SHOULDER ARTHROSCOPY WITH SUBACROMIAL DECOMPRESSION; PARTIAL ACROMIOPLASTY WITH CORACOACROMIAL RELEASE AND  BICEP TENDODESIS;  Surgeon: Hiram Gash, MD;  Location: [place];  Service: Orthopedics;  Laterality: Left;   ULNAR NERVE TRANSPOSITION Left 05/11/2021   Procedure: RADIAL  NERVE DECOMPRESSION;  Surgeon: Hiram Gash, MD;  Location: [place];  Service: Orthopedics;  Laterality: Left;   Patient Active Problem List   Diagnosis Date Noted   History of miscarriage 08/19/2015   Elevated WBC count  05/18/2015   Pneumococcal vaccination declined 03/15/2015   Diabetes mellitus, type 2 ([place]) 12/23/2014   IMPAIRED GLUCOSE TOLERANCE TEST 10/13/2010    PCP: Precious Haws  REFERRING PROVIDER: Noemi Chapel  REFERRING DIAG: Lt Rotator Cuff Tendonitis  THERAPY DIAG:  Chronic left shoulder pain  Stiffness of left shoulder, not elsewhere classified  Muscle weakness (generalized)  Acute pain of left shoulder  Cramp and spasm  Abnormal posture  Rationale for Evaluation and Treatment: Rehabilitation  ONSET DATE: 06/19/21  SUBJECTIVE:  SUBJECTIVE STATEMENT: The shoulder still hurts if weather is cold, or when I overuse it, hurts when I'm trying to sleep too. PERTINENT HISTORY: DM, HTN, ORIF elbow fx 2022, shoulder arthroscopy w/clavicle resection and subacromial decompression and bicep tendon repair 2022 L  PAIN:  Are you having pain? Yes: NPRS scale: 5/10 Pain location: L shoulder radiates down to elbow, sometimes the R Pain description: pins and needles, shoulder is constant and dull Aggravating factors: movement, if the weather is bad  Relieving factors: pain meds, voltaren, hot showers  PRECAUTIONS: None  WEIGHT BEARING RESTRICTIONS: No  FALLS:  Has patient fallen in last 6 months? No  LIVING ENVIRONMENT: Lives with: lives with their family Lives in: House/apartment Stairs: Yes: Internal: 14 steps; on right going up Has following equipment at home: None  OCCUPATION: Teacher  PLOF: Independent  PATIENT GOALS: to have more range of motion with my arm and less pain   NEXT MD VISIT:   OBJECTIVE:   DIAGNOSTIC FINDINGS:  MRI on 12/18/22 was unremarkable  PATIENT SURVEYS:  Quick Dash 22.7%  COGNITION: Overall cognitive status: Within functional limits for tasks  assessed     SENSATION: WFL  POSTURE: Rounded shoulders  UPPER EXTREMITY ROM:   Active ROM Right eval Left eval  Shoulder flexion WFL slight pain at end range 145 with pain   Shoulder extension    Shoulder abduction WFL slight pain 110 when pain starts  Shoulder adduction    Shoulder internal rotation WFL 45 pain starts  Shoulder external rotation WFL 65 pain starts  Elbow flexion    Elbow extension    Wrist flexion    Wrist extension    Wrist ulnar deviation    Wrist radial deviation    Wrist pronation    Wrist supination    (Blank rows = not tested)  UPPER EXTREMITY MMT:  MMT Right eval Left eval  Shoulder flexion 3+ 3+  Shoulder extension    Shoulder abduction 4 3+  Shoulder adduction    Shoulder internal rotation 4+ 4  Shoulder external rotation 4 3+  Middle trapezius    Lower trapezius    Elbow flexion 5 5  Elbow extension 5 5  Wrist flexion    Wrist extension    Wrist ulnar deviation    Wrist radial deviation    Wrist pronation    Wrist supination    Grip strength (lbs)    (Blank rows = not tested)  SHOULDER SPECIAL TESTS: Impingement tests: Neer impingement test: positive , Hawkins/Kennedy impingement test: positive , and Painful arc test: positive   Empty can test: positive   JOINT MOBILITY TESTING:  PROM can get full ROM with pain, some muscle guarding  PALPATION:  TTP left shoulder   TODAY'S TREATMENT:  DATE:  02/06/23: Manual: Supine AAROM, quick assessment:  nearly full flexion, abd, ER.  Restricted IR to 55 degrees.  therefore gentle gr 2 inferior glides L GH jt, 2 bouts 30 sec each Prone for PA jt mob T 7R transverse process and L corresponding post rib  Therex: UBE 3 min F and B at beginning of session for tissue perfusion MMT with weakness and pain with L shoulder abduction and ER, 4-/5 each.    Consolidated routine for home:  Black theraband for B shoulder extension(top of doorway) 10 x 3 reps Green theraband for L shoulder rows to isolate middle traps 10 x 3 Seated Scapular punches with black t band 10 x 3  Seated B shoulder ER, fast pace, short amplitude with red band,  Cues to maintain palms facing one another, emphasis on quality, rhythmic stab  Ionto L superior jt line GH jt.   01/28/23 UBE L2 x77mns  Pec stretch 30s  quickDash- 36.4% Seated rows and lats 20# 2x10 ER with green band 2x10  3 way reaches with red band x10 Seated thoracic ext over half foam roll x10 Cold pack to L shoulder 5 mins  Ionto patch to R L shoulder #1   01/23/23 UBE L2-3 x 2 min forward and 2 minutes back STM to L pects, with CFM to supraspinatus and biceps tendons Joint mobs, inferior and post glide, Grade III-IV, 2 x 10 on L Scapular mobilization Scapular stabilization exercises- Shoulder ext, rows, against 10#, ER against 5# resistance, 2 x 10 reps each Ball pushes gainst wall in square pattern, 5 x in each direction. Shoulder flex serratus strengthening with yellow Tband at wrists x 10 CP to L shoulder x 10 minutes for pain and inflammation relief.  01/21/23:  Observed B shoulder flexion, abduction, hand behind back:lacks 15 degrees L compared to R  UBE L1, 4 min Manual:   Prone for HVLA mobs T 5 Attempted cervicothoracic jxn without success Supine for 3 bouts of 60 gr 4 oscillations for inf/post GH jt glides with shoulder at 90 degrees abd Supine contract/ relax for L shoulder IR stretch at 90 degrees abduction  Therapeutic exercise: Supine sustained isometric horizontal B shoulder abduction with strap, while performing B shoulder flexion, for motor control/ muscle energy, performed pain free , therapist stabilized scapular against trunk throughout motion 18 reps Seated for isotonic B shoulder rows, 25#, 2 sets 15 Seated lat pul downs, 15#, 15 reps Seated chest press with serratus  push at end 2 sets 10 10 reps Prone for horizontal abduction L shoulder, 15 reps, to cue middle traps Prone for 90 degrees abduction with ER 15 reps  Seated for ice L shoulder 8 min post Rx  01/16/23:   UBE L3 x458ms Seated rows and lats 25# 2x10 OHP blue ball 2x10 PROM to L shoulder all directions, grade 3 posterior and inferior glides 1 min bouts x 3 with L shoulder at 90 degrees abduction SA punches 3# 2x10 LUE Sidelying ER 3# 2x10 LUE Sidelying abduction 3# 2x10 LUE  Prone HVLA T 3 to 5  Tricep ext B 25# 2x10 Bicep curls 25# 2x10 Standing PNF /pec flys pulley 5# 10x 2 Ice to L shoulder 35m76m   01/14/23 UBE L3 x4mi13mSeated rows and lats 25# 2x10 OHP blue ball 2x10 PROM to L shoulder all directions, grade 3 posterior and inferior glides SA punches 3# 2x10 LUE Sidelying ER 3# 2x10 LUE Sidelying abduction 3# 2x10 LUE  Tricep ext 25# 2x10 Bicep curls  25# 2x10 Ice to L shoulder 40mns    01/09/23 UBE L2 x3 mins each way IR stretch with towel 5s holds x10 ER with green band 2x10 Shoulder ext 5# x10, 10# x10  Cable rows 10# 2x10 IYT 2# 2x10  OHP yellow ball 2x10 MH to L shoulder 124ms   01/07/23 UBE L2  x3 mins each way  Rows and ext red 2x10 Supine flexion 2# WaTE 2x10 Chest press 2# WaTE 2x10 PROM to L shoulder all directions ER/IR 2# 2x10 Horizontal abd green 2x10 Lateral raises 2# 2x10 OHP yellow 2x10 ICE:  ice pack L shoulder in sitting post treatment to assist with soreness x104m  01/02/23:  Manual:  supine for inferior glides, 60 sec bouts, 3 reps, with shoulder at 90 degrees abduction.   R Sidelying for tactile cues to isolate scapular motion , all planes Supine AAROM/ stretching, with GH distraction and medial L scapular glides against trunk with L shoulder flexion Supine contract /relax L shoulder IR stretch at 90 degrees abd Prone for PA mid, lower thoracic HVLA mobs, to improve thoracic extension with shoulder flexion  Therex:  supine isometric  horizontal shoulder abduction with B flexion with strap 20x R side lying for L shoulder ER(pillow under axilla)  3 x 15 Seated for band B shoulder Er , horiz abd, and rows , yellow for Er, green for rows, all fast movements, low amplitude to increase endurance of postural musculature.  ICE:  ice pack L shoulder in sitting post treatment to assist with soreness  12/25/22- EVAL   PATIENT EDUCATION: Education details: POC and HEP Person educated: Patient Education method: Explanation Education comprehension: verbalized understanding  HOME EXERCISE PROGRAM: Access Code: [email or web address]: [email or web address] Date: 02/06/2023 Prepared by: Valora Norell  Exercises - Shoulder extension with resistance - Neutral  - 1 x daily - 7 x weekly - 3 sets - 10 reps - Standing Shoulder Row with Anchored Resistance  - 1 x daily - 7 x weekly - 3 sets - 10 reps - Standing Serratus Punch with Resistance  - 1 x daily - 7 x weekly - 3 sets - 10 reps - Seated Shoulder W External Rotation on Swiss Ball  - 1 x daily - 7 x weekly - 3 sets - 10 reps Access Code: [email or web address] Date: 12/25/2022 Prepared by: MonAndris Baumannxercises - Shoulder Extension with Resistance  - 1 x daily - 7 x weekly - 2 sets - 10 reps - Standing Shoulder Row with Anchored Resistance  - 1 x daily - 7 x weekly - 2 sets - 10 reps - Shoulder External Rotation and Scapular Retraction with Resistance  - 1 x daily - 7 x weekly - 2 sets - 10 reps - Standing Shoulder Internal Rotation Stretch with Towel  - 1 x daily - 7 x weekly - 2 sets - 10 reps  ASSESSMENT:  CLINICAL IMPRESSION: Patient returned today for ongoing skilled PT to address L shoulder pain.  She only has once more session approved with her unsurance plan so today we focused on updating her home program.  Quick assessment of her L shoulder flexibility and strength reveals much improved flexibility since initial eval, still weak  particularly L shoulder ER, which corresponds with her pain with activity, use of L shoulder. Condensed home program to include periscapular strengthening and motor control, and motor control, rhythmic stabilization for external rotators.  Increased resistance for different exercises as appropriate.  She tolerated well. Will continue one more  visit to reassess and most likely DC. OBJECTIVE IMPAIRMENTS: decreased ROM, decreased strength, impaired UE functional use, and pain.   ACTIVITY LIMITATIONS: carrying, lifting, and reach over head   REHAB POTENTIAL: Good  CLINICAL DECISION MAKING: Stable/uncomplicated  EVALUATION COMPLEXITY: Low  GOALS: Goals reviewed with patient? Yes  SHORT TERM GOALS: Target date: 01/22/23  Patient will be independent with initial HEP.  Goal status: MET   LONG TERM GOALS: Target date: 02/26/23  Patient will be independent with advanced/ongoing HEP to improve outcomes and carryover.  Goal status: IN PROGRESS  2.  Patient will report 75% improvement in L shoulder pain to improve QOL.  Baseline: 3/10 Goal status: IN PROGRESS  3.  Patient to improve L shoulder AROM to [facility] without pain provocation to allow for increased ease of ADLs.  Goal status: IN PROGRESS 02/05/23:  nearly full AROM L shoulder flex, abd, ER, limited IR to 55  4.  Patient will demonstrate improved functional UE strength as demonstrated by 5/5. 02/06/23: L shoulder ER and abduction 3+ to 4-/5 Goal status: IN PROGRESS   PLAN:  PT FREQUENCY: 1-2x/week  PT DURATION: 8 weeks  PLANNED INTERVENTIONS: Therapeutic exercises, Therapeutic activity, Neuromuscular re-education, Balance training, Gait training, Patient/Family education, Self Care, Joint mobilization, Dry Needling, Electrical stimulation, Cryotherapy, Moist heat, Vasopneumatic device, Ionotophoresis '4mg'$ /ml Dexamethasone, and Manual therapy  PLAN FOR NEXT SESSION:  L shoulder ROM and strengthening, most likely DC   Ethel Rana  DPT 02/06/23 11:34 AM

## 2023-02-08 ENCOUNTER — Ambulatory Visit: Payer: Medicaid Other | Admitting: Physical Therapy

## 2023-02-11 ENCOUNTER — Ambulatory Visit: Payer: Medicaid Other

## 2023-02-11 ENCOUNTER — Ambulatory Visit
Admission: RE | Admit: 2023-02-11 | Discharge: 2023-02-11 | Disposition: A | Discharge status: Home / Self Care | Payer: Medicaid Other | Source: Ambulatory Visit | Attending: Cardiology | Admitting: Cardiology

## 2023-02-11 DIAGNOSIS — R072 Precordial pain: Secondary | ICD-10-CM

## 2023-02-14 ENCOUNTER — Ambulatory Visit: Payer: Medicaid Other

## 2023-02-14 DIAGNOSIS — M25612 Stiffness of left shoulder, not elsewhere classified: Secondary | ICD-10-CM

## 2023-02-14 DIAGNOSIS — G8929 Other chronic pain: Secondary | ICD-10-CM

## 2023-02-14 DIAGNOSIS — M25512 Pain in left shoulder: Secondary | ICD-10-CM | POA: Diagnosis not present

## 2023-02-14 DIAGNOSIS — M6281 Muscle weakness (generalized): Secondary | ICD-10-CM

## 2023-02-14 NOTE — Therapy (Addendum)
OUTPATIENT PHYSICAL THERAPY SHOULDER RX NOTE   See note below for Objective Data and Assessment of Progress/Goals.     Patient Name: Lynn Moody MRN: [id number] DOB:07-16-1971, 52 y.o., female Today's Date: 02/06/2023  END OF SESSION:  PT End of Session - 02/06/23 1021     Visit Number 10    Date for PT Re-Evaluation 02/26/23    Authorization Type [employer]    Authorization Time Period 12 visits by 09/14/21 then transition to HEP    PT Start Time 1018    PT Stop Time 1100    PT Time Calculation (min) 42 min                  Past Medical History:  Diagnosis Date   Anemia    Diabetes mellitus without complication ([place])    Hypertension    Past Surgical History:  Procedure Laterality Date   BREAST BIOPSY     left breast with maker placed   CESAREAN SECTION     DENTAL SURGERY     ORIF ELBOW FRACTURE Left 05/11/2021   Procedure: OPEN REDUCTION INTERNAL FIXATION DISTAL HUMERUS FRACTURE;  Surgeon: Hiram Gash, MD;  Location: [place];  Service: Orthopedics;  Laterality: Left;   SHOULDER ARTHROSCOPY WITH DISTAL CLAVICLE RESECTION Left 06/28/2021   Procedure: SHOULDER ARTHROSCOPY WITH DISTAL CLAVICLE RESECTION;  Surgeon: Hiram Gash, MD;  Location: [place];  Service: Orthopedics;  Laterality: Left;   SHOULDER ARTHROSCOPY WITH SUBACROMIAL DECOMPRESSION AND BICEP TENDON REPAIR Left 06/28/2021   Procedure: SHOULDER ARTHROSCOPY WITH SUBACROMIAL DECOMPRESSION; PARTIAL ACROMIOPLASTY WITH CORACOACROMIAL RELEASE AND  BICEP TENDODESIS;  Surgeon: Hiram Gash, MD;  Location: [place];  Service: Orthopedics;  Laterality: Left;   ULNAR NERVE TRANSPOSITION Left 05/11/2021   Procedure: RADIAL  NERVE DECOMPRESSION;  Surgeon: Hiram Gash, MD;  Location: [place];  Service: Orthopedics;  Laterality: Left;   Patient Active Problem List   Diagnosis Date Noted   History of miscarriage 08/19/2015   Elevated WBC count  05/18/2015   Pneumococcal vaccination declined 03/15/2015   Diabetes mellitus, type 2 ([place]) 12/23/2014   IMPAIRED GLUCOSE TOLERANCE TEST 10/13/2010    PCP: Precious Haws  REFERRING PROVIDER: Noemi Chapel  REFERRING DIAG: Lt Rotator Cuff Tendonitis  THERAPY DIAG:  Chronic left shoulder pain  Stiffness of left shoulder, not elsewhere classified  Muscle weakness (generalized)  Acute pain of left shoulder  Cramp and spasm  Abnormal posture  Rationale for Evaluation and Treatment: Rehabilitation  ONSET DATE: 06/19/21  SUBJECTIVE:  SUBJECTIVE STATEMENT: The shoulder still hurts, insurance is giving me a hard time    PERTINENT HISTORY: DM, HTN, ORIF elbow fx 2022, shoulder arthroscopy w/clavicle resection and subacromial decompression and bicep tendon repair 2022 L  PAIN:  Are you having pain? Yes: NPRS scale: 3/10 Pain location: L shoulder radiates down to elbow, sometimes the R Pain description: pins and needles, shoulder is constant and dull Aggravating factors: movement, if the weather is bad  Relieving factors: pain meds, voltaren, hot showers  PRECAUTIONS: None  WEIGHT BEARING RESTRICTIONS: No  FALLS:  Has patient fallen in last 6 months? No  LIVING ENVIRONMENT: Lives with: lives with their family Lives in: House/apartment Stairs: Yes: Internal: 14 steps; on right going up Has following equipment at home: None  OCCUPATION: Teacher  PLOF: Independent  PATIENT GOALS: to have more range of motion with my arm and less pain   NEXT MD VISIT:   OBJECTIVE:   DIAGNOSTIC FINDINGS:  MRI on 12/18/22 was unremarkable  PATIENT SURVEYS:  Quick Dash 22.7%  COGNITION: Overall cognitive status: Within functional limits for tasks assessed     SENSATION: WFL  POSTURE: Rounded  shoulders  UPPER EXTREMITY ROM:   Active ROM Right eval Left eval  Shoulder flexion WFL slight pain at end range 145 with pain   Shoulder extension    Shoulder abduction WFL slight pain 110 when pain starts  Shoulder adduction    Shoulder internal rotation WFL 45 pain starts  Shoulder external rotation WFL 65 pain starts  Elbow flexion    Elbow extension    Wrist flexion    Wrist extension    Wrist ulnar deviation    Wrist radial deviation    Wrist pronation    Wrist supination    (Blank rows = not tested)  UPPER EXTREMITY MMT:  MMT Right eval Left eval  Shoulder flexion 3+ 3+  Shoulder extension    Shoulder abduction 4 3+  Shoulder adduction    Shoulder internal rotation 4+ 4  Shoulder external rotation 4 3+  Middle trapezius    Lower trapezius    Elbow flexion 5 5  Elbow extension 5 5  Wrist flexion    Wrist extension    Wrist ulnar deviation    Wrist radial deviation    Wrist pronation    Wrist supination    Grip strength (lbs)    (Blank rows = not tested)  SHOULDER SPECIAL TESTS: Impingement tests: Neer impingement test: positive , Hawkins/Kennedy impingement test: positive , and Painful arc test: positive   Empty can test: positive   JOINT MOBILITY TESTING:  PROM can get full ROM with pain, some muscle guarding  PALPATION:  TTP left shoulder   TODAY'S TREATMENT:  DATE:  02/14/23 UBE L2 x2 mins backwards and forwards  Recheck ROM and MMT Seated rows 25# 2x10 Lat pull downs 25# 2x10 Chest press 20# 2x10 SA lift with red band 4x5 3 way reaches red x10 3# flexion and abd 2x10 Ice to L shoulder 8 mins   02/06/23: Manual: Supine AAROM, quick assessment:  nearly full flexion, abd, ER.  Restricted IR to 55 degrees.  therefore gentle gr 2 inferior glides L GH jt, 2 bouts 30 sec each Prone for PA jt mob T 7R  transverse process and L corresponding post rib  Therex: UBE 3 min F and B at beginning of session for tissue perfusion MMT with weakness and pain with L shoulder abduction and ER, 4-/5 each.   Consolidated routine for home:  Black theraband for B shoulder extension(top of doorway) 10 x 3 reps Green theraband for L shoulder rows to isolate middle traps 10 x 3 Seated Scapular punches with black t band 10 x 3  Seated B shoulder ER, fast pace, short amplitude with red band,  Cues to maintain palms facing one another, emphasis on quality, rhythmic stab  Ionto L superior jt line GH jt.   01/28/23 UBE L2 x23mins  Pec stretch 30s  quickDash- 36.4% Seated rows and lats 20# 2x10 ER with green band 2x10  3 way reaches with red band x10 Seated thoracic ext over half foam roll x10 Cold pack to L shoulder 5 mins  Ionto patch to R L shoulder #1   01/23/23 UBE L2-3 x 2 min forward and 2 minutes back STM to L pects, with CFM to supraspinatus and biceps tendons Joint mobs, inferior and post glide, Grade III-IV, 2 x 10 on L Scapular mobilization Scapular stabilization exercises- Shoulder ext, rows, against 10#, ER against 5# resistance, 2 x 10 reps each Ball pushes gainst wall in square pattern, 5 x in each direction. Shoulder flex serratus strengthening with yellow Tband at wrists x 10 CP to L shoulder x 10 minutes for pain and inflammation relief.  01/21/23:  Observed B shoulder flexion, abduction, hand behind back:lacks 15 degrees L compared to R  UBE L1, 4 min Manual:   Prone for HVLA mobs T 5 Attempted cervicothoracic jxn without success Supine for 3 bouts of 60 gr 4 oscillations for inf/post GH jt glides with shoulder at 90 degrees abd Supine contract/ relax for L shoulder IR stretch at 90 degrees abduction  Therapeutic exercise: Supine sustained isometric horizontal B shoulder abduction with strap, while performing B shoulder flexion, for motor control/ muscle energy, performed pain  free , therapist stabilized scapular against trunk throughout motion 18 reps Seated for isotonic B shoulder rows, 25#, 2 sets 15 Seated lat pul downs, 15#, 15 reps Seated chest press with serratus push at end 2 sets 10 10 reps Prone for horizontal abduction L shoulder, 15 reps, to cue middle traps Prone for 90 degrees abduction with ER 15 reps  Seated for ice L shoulder 8 min post Rx  01/16/23:   UBE L3 x67mins Seated rows and lats 25# 2x10 OHP blue ball 2x10 PROM to L shoulder all directions, grade 3 posterior and inferior glides 1 min bouts x 3 with L shoulder at 90 degrees abduction SA punches 3# 2x10 LUE Sidelying ER 3# 2x10 LUE Sidelying abduction 3# 2x10 LUE  Prone HVLA T 3 to 5  Tricep ext B 25# 2x10 Bicep curls 25# 2x10 Standing PNF /pec flys pulley 5# 10x 2 Ice to L shoulder 75mins  01/14/23 UBE L3 x57mins Seated rows and lats 25# 2x10 OHP blue ball 2x10 PROM to L shoulder all directions, grade 3 posterior and inferior glides SA punches 3# 2x10 LUE Sidelying ER 3# 2x10 LUE Sidelying abduction 3# 2x10 LUE  Tricep ext 25# 2x10 Bicep curls 25# 2x10 Ice to L shoulder 59mins    01/09/23 UBE L2 x3 mins each way IR stretch with towel 5s holds x10 ER with green band 2x10 Shoulder ext 5# x10, 10# x10  Cable rows 10# 2x10 IYT 2# 2x10  OHP yellow ball 2x10 MH to L shoulder 24mins   01/07/23 UBE L2  x3 mins each way  Rows and ext red 2x10 Supine flexion 2# WaTE 2x10 Chest press 2# WaTE 2x10 PROM to L shoulder all directions ER/IR 2# 2x10 Horizontal abd green 2x10 Lateral raises 2# 2x10 OHP yellow 2x10 ICE:  ice pack L shoulder in sitting post treatment to assist with soreness x41mins  01/02/23:  Manual:  supine for inferior glides, 60 sec bouts, 3 reps, with shoulder at 90 degrees abduction.   R Sidelying for tactile cues to isolate scapular motion , all planes Supine AAROM/ stretching, with GH distraction and medial L scapular glides against trunk with L shoulder  flexion Supine contract /relax L shoulder IR stretch at 90 degrees abd Prone for PA mid, lower thoracic HVLA mobs, to improve thoracic extension with shoulder flexion  Therex:  supine isometric horizontal shoulder abduction with B flexion with strap 20x R side lying for L shoulder ER(pillow under axilla)  3 x 15 Seated for band B shoulder Er , horiz abd, and rows , yellow for Er, green for rows, all fast movements, low amplitude to increase endurance of postural musculature.  ICE:  ice pack L shoulder in sitting post treatment to assist with soreness  12/25/22- EVAL   PATIENT EDUCATION: Education details: POC and HEP Person educated: Patient Education method: Explanation Education comprehension: verbalized understanding  HOME EXERCISE PROGRAM: Access Code: [email or web address] URL: [email or web address] Date: 02/06/2023 Prepared by: Amy Speaks  Exercises - Shoulder extension with resistance - Neutral  - 1 x daily - 7 x weekly - 3 sets - 10 reps - Standing Shoulder Row with Anchored Resistance  - 1 x daily - 7 x weekly - 3 sets - 10 reps - Standing Serratus Punch with Resistance  - 1 x daily - 7 x weekly - 3 sets - 10 reps - Seated Shoulder W External Rotation on Swiss Ball  - 1 x daily - 7 x weekly - 3 sets - 10 reps Access Code: [email or web address] URL: [email or web address] Date: 12/25/2022 Prepared by: Andris Baumann  Exercises - Shoulder Extension with Resistance  - 1 x daily - 7 x weekly - 2 sets - 10 reps - Standing Shoulder Row with Anchored Resistance  - 1 x daily - 7 x weekly - 2 sets - 10 reps - Shoulder External Rotation and Scapular Retraction with Resistance  - 1 x daily - 7 x weekly - 2 sets - 10 reps - Standing Shoulder Internal Rotation Stretch with Towel  - 1 x daily - 7 x weekly - 2 sets - 10 reps  ASSESSMENT:  CLINICAL IMPRESSION: Patient returned today for ongoing PT to address L shoulder pain. Last session approved with her insurance plan.  Reassessment of her L shoulder flexibility and strength. She was given an updated HEP last visit, so today we went over some machines she can do at the gym. Does well with progressive interventions  OBJECTIVE IMPAIRMENTS: decreased ROM, decreased strength, impaired UE functional use, and pain.   ACTIVITY LIMITATIONS: carrying, lifting, and reach over head   REHAB POTENTIAL: Good  CLINICAL DECISION MAKING: Stable/uncomplicated  EVALUATION COMPLEXITY: Low  GOALS: Goals reviewed with patient? Yes  SHORT TERM GOALS: Target date: 01/22/23  Patient will be independent with initial HEP.  Goal status: MET   LONG TERM GOALS: Target date: 02/26/23  Patient will be independent with advanced/ongoing HEP to improve outcomes and carryover.  Goal status: IN PROGRESS  2.  Patient will report 75% improvement in L shoulder pain to improve QOL.  Baseline: 3/10 Goal status: IN PROGRESS  3.  Patient to improve L shoulder AROM to [facility] without pain provocation to allow for increased ease of ADLs.  Goal status: MET 02/05/23:  nearly full AROM L shoulder flex, abd, ER, limited IR to 55 WFL for all motions- 02/14/23  4.  Patient will demonstrate improved functional UE strength as demonstrated by 5/5.  02/06/23: L shoulder ER and abduction 3+ to 4-/5,  02/14/23- 5/5 except L ER 4/5 Goal status: partially met   PLAN:  PT FREQUENCY: 1-2x/week  PT DURATION: 8 weeks  PLANNED INTERVENTIONS: Therapeutic exercises, Therapeutic activity, Neuromuscular re-education, Balance training, Gait training, Patient/Family education, Self Care, Joint mobilization, Dry Needling, Electrical stimulation, Cryotherapy, Moist heat, Vasopneumatic device, Ionotophoresis 4mg /ml Dexamethasone, and Manual therapy  PLAN FOR NEXT SESSION:  L shoulder ROM and strengthening, most likely DC  PHYSICAL THERAPY DISCHARGE SUMMARY  Visits from Start of Care: 11   Patient agrees to discharge. Patient goals were partially met. Patient  is being discharged due to financial reasons.   Ethel Rana DPT 02/06/23 11:34 AM

## 2023-02-18 ENCOUNTER — Ambulatory Visit: Payer: Medicaid Other

## 2023-02-21 ENCOUNTER — Ambulatory Visit: Payer: Medicaid Other

## 2023-02-28 NOTE — Progress Notes (Signed)
[email or web address]

## 2023-03-01 NOTE — Progress Notes (Signed)
Called patient to inform her about her CT scan results. Patient understood.

## 2023-04-08 ENCOUNTER — Ambulatory Visit: Payer: Medicaid Other | Admitting: Cardiology

## 2023-04-08 ENCOUNTER — Encounter: Payer: Self-pay | Admitting: Cardiology

## 2023-04-08 VITALS — BP 142/78 | HR 81 | Resp 18 | Ht 64.0 in | Wt 211.2 lb

## 2023-04-08 DIAGNOSIS — R072 Precordial pain: Secondary | ICD-10-CM

## 2023-04-08 DIAGNOSIS — E781 Pure hyperglyceridemia: Secondary | ICD-10-CM

## 2023-04-08 DIAGNOSIS — I471 Supraventricular tachycardia, unspecified: Secondary | ICD-10-CM

## 2023-04-08 DIAGNOSIS — R002 Palpitations: Secondary | ICD-10-CM

## 2023-04-08 DIAGNOSIS — I10 Essential (primary) hypertension: Secondary | ICD-10-CM

## 2023-04-08 DIAGNOSIS — E1165 Type 2 diabetes mellitus with hyperglycemia: Secondary | ICD-10-CM

## 2023-04-08 NOTE — Progress Notes (Signed)
ID:  Lynn Moody, DOB 02/12/1971, MRN [id number]  PCP:  Verlon Au, MD  Cardiologist:  Tessa Lerner, DO, [facility] (established care 11/21/2022)  Date: 04/08/23 Last Office Visit: 01/03/2023  Chief Complaint  Patient presents with   Palpitations   Follow-up    HPI  Lynn Moody is a 52 y.o. African-American female whose past medical history and cardiovascular risk factors include: Hypertension, non-insulin-dependent diabetes, hyperlipidemia, obesity due to excess calories.   Patient was referred to the practice for evaluation of palpitations.  Her Zio patch noted underlying rhythm to be sinus with episodes of PSVT but overall PVC/PAC burden <1%.  Due to continued symptoms of palpitations we discussed initiating low-dose diltiazem but patient was reluctant.  She was educated on keeping yourself well-hydrated, trial of Valsalva maneuver if she has prolonged episodes and also applying cold water when the symptoms occur.  Given her precordial pain shared decision was to proceed with CAC, and exercise treadmill stress test.  She presents today for 62-month follow-up visit. Precordial discomfort has resolved.  Palpitations have improved significantly.  Denies near-syncope or syncopal events  FUNCTIONAL STATUS: No structured exercise program or daily routine.   ALLERGIES: No Known Allergies  MEDICATION LIST PRIOR TO VISIT: Current Meds  Medication Sig   Continuous Blood Gluc Sensor (DEXCOM G6 SENSOR) MISC SMARTSIG:1 Topical Every 10 Days   diclofenac Sodium (VOLTAREN) 1 % GEL Apply 2 g topically 4 (four) times daily.   ferrous sulfate 324 MG TBEC Take 324 mg by mouth.   ibuprofen (ADVIL) 800 MG tablet Take 800 mg by mouth every 6 (six) hours as needed.   metFORMIN (GLUCOPHAGE-XR) 500 MG 24 hr tablet Take 1,000 mg by mouth 2 (two) times daily.   olmesartan-hydrochlorothiazide (BENICAR HCT) 20-12.5 MG tablet Take 1 tablet by mouth daily.   rosuvastatin (CRESTOR) 10 MG tablet  Take 10 mg by mouth daily.   traMADol (ULTRAM) 50 MG tablet Take 50 mg by mouth every 6 (six) hours as needed for moderate pain or severe pain.     PAST MEDICAL HISTORY: Past Medical History:  Diagnosis Date   Anemia    Diabetes mellitus without complication (HCC)    Hypertension     PAST SURGICAL HISTORY: Past Surgical History:  Procedure Laterality Date   BREAST BIOPSY     left breast with maker placed   CESAREAN SECTION     DENTAL SURGERY     ORIF ELBOW FRACTURE Left 05/11/2021   Procedure: OPEN REDUCTION INTERNAL FIXATION DISTAL HUMERUS FRACTURE;  Surgeon: Bjorn Pippin, MD;  Location: [facility];  Service: Orthopedics;  Laterality: Left;   SHOULDER ARTHROSCOPY WITH DISTAL CLAVICLE RESECTION Left 06/28/2021   Procedure: SHOULDER ARTHROSCOPY WITH DISTAL CLAVICLE RESECTION;  Surgeon: Bjorn Pippin, MD;  Location: [facility];  Service: Orthopedics;  Laterality: Left;   SHOULDER ARTHROSCOPY WITH SUBACROMIAL DECOMPRESSION AND BICEP TENDON REPAIR Left 06/28/2021   Procedure: SHOULDER ARTHROSCOPY WITH SUBACROMIAL DECOMPRESSION; PARTIAL ACROMIOPLASTY WITH CORACOACROMIAL RELEASE AND  BICEP TENDODESIS;  Surgeon: Bjorn Pippin, MD;  Location: [facility];  Service: Orthopedics;  Laterality: Left;   ULNAR NERVE TRANSPOSITION Left 05/11/2021   Procedure: RADIAL  NERVE DECOMPRESSION;  Surgeon: Bjorn Pippin, MD;  Location:  SURGERY CENTER;  Service: Orthopedics;  Laterality: Left;    FAMILY HISTORY: The patient family history includes Diabetes in her mother and paternal grandmother; Glaucoma in her father and mother; Hypertension in her paternal grandmother; Prostate cancer in  her father.  SOCIAL HISTORY:  The patient  reports that she has never smoked. She has never used smokeless tobacco. She reports that she does not drink alcohol and does not use drugs.  REVIEW OF SYSTEMS: Review of Systems  Cardiovascular:  Negative for chest pain,  claudication, dyspnea on exertion, irregular heartbeat, leg swelling, near-syncope, orthopnea, palpitations, paroxysmal nocturnal dyspnea and syncope.  Respiratory:  Negative for shortness of breath.   Hematologic/Lymphatic: Negative for bleeding problem.  Musculoskeletal:  Negative for muscle cramps and myalgias.  Neurological:  Negative for dizziness and light-headedness.    PHYSICAL EXAM:    04/08/2023   10:24 AM 01/03/2023   12:05 PM 11/21/2022    9:30 AM  Vitals with BMI  Height 5\' 4"  5\' 4"  5\' 4"   Weight 211 lbs 3 oz 208 lbs 3 oz 208 lbs 10 oz  BMI 36.23 35.72 35.79  Systolic 142 132 161  Diastolic 78 84 88  Pulse 81 96 84    Physical Exam  Constitutional: No distress.  Age appropriate, hemodynamically stable.   Neck: No JVD present.  Cardiovascular: Normal rate, regular rhythm, S1 normal, S2 normal, intact distal pulses and normal pulses. Exam reveals no gallop, no S3 and no S4.  No murmur heard. Pulmonary/Chest: Effort normal and breath sounds normal. No stridor. She has no wheezes. She has no rales.  Abdominal: Soft. Bowel sounds are normal. She exhibits no distension. There is no abdominal tenderness.  Musculoskeletal:        General: No edema.     Cervical back: Neck supple.  Neurological: She is alert and oriented to person, place, and time. She has intact cranial nerves (2-12).  Skin: Skin is warm and moist.   CARDIAC DATABASE: EKG: 11/21/2022: Normal sinus rhythm, 88 bpm, normal axis, without underlying ischemia or injury pattern.  Echocardiogram: 12/05/2022: Normal LV systolic function with visual EF 60-65%. Left ventricle cavity is normal in size. Normal left ventricular wall thickness. Normal global wall motion. Normal diastolic filling pattern, normal LAP.  Mild tricuspid regurgitation. No evidence of pulmonary hypertension. No prior study for comparison.    Stress Testing: Exercise treadmill stress test 01/07/2023: Exercise treadmill stress test performed  using Bruce protocol. Patient reached 5.7 METS, and 91% of age predicted maximum heart rate. Exercise capacity was low. No chest pain reported. Normal heart rate and hemodynamic response.  Stress EKG showed sinus tachycardia, 1.5 mm upsloping ST depression in leads II, III, aVF, V5-V6 that normalize by 1 min into recovery. EKG changes are equivocal for ischemia. Low risk study. Of note, exercise capacity is poor. \  Heart Catheterization: [facility], [facility]28 [facility] Dr. Onalee Hua. No epicardial coronary artery disease per report  Cardiac monitor [facility] Patch): December 05, 2022 -December 18, 2022 Dominant rhythm sinus, followed by tachycardia (burden 25%). Heart rate 59-190 bpm. Avg HR 94 bpm. No atrial fibrillation, ventricular tachycardia, high grade AV block, pauses (3 seconds or longer). Total ventricular ectopic burden <1%. Total supraventricular ectopic burden <1%. Rare episodes of PSVT likely atrial tachycardia (Fastest episode 33 beats, 11 seconds, average HR 163 bpm, maximum HR 190 bpm). Patient triggered events: 2. These did not correlate with any significant dysrhythmias.   CT Cardiac Scoring: 02/12/2023 Total CAC 0 AU, percentile for patient's age, sex, and race. Noncardiac findings:Moderate hepatic steatosis.   LABORATORY DATA:    Latest Ref Rng & Units 11/04/2022    8:50 PM 08/03/2015    7:31 PM 07/25/2015   12:44  PM  CBC  WBC 4.0 - 10.5 K/uL 15.4  13.1  11.8   Hemoglobin 12.0 - 15.0 g/dL 16.1  09.6  04.5   Hematocrit 36.0 - 46.0 % 37.9  33.7  33.8   Platelets 150 - 400 K/uL 459  357  351        Latest Ref Rng & Units 11/04/2022    8:50 PM 06/28/2021   10:00 AM 05/09/2021    2:30 PM  CMP  Glucose 70 - 99 mg/dL 409  811  94   BUN 6 - 20 mg/dL 8  7  6    Creatinine 0.44 - 1.00 mg/dL 9.14  7.82  9.56   Sodium 135 - 145 mmol/L 134  139  136   Potassium 3.5 - 5.1 mmol/L 3.9  3.6  3.9   Chloride 98 - 111 mmol/L 98  104  102   CO2 22 - 32  mmol/L 27  24  28    Calcium 8.9 - 10.3 mg/dL 8.7  8.7  9.1     External Labs: Collected: 11/04/2022 [facility]. WBC 13.9, hemoglobin 12.6, hematocrit 38.7, platelets 423 Hemoglobin A1c 9. TSH 0.79 Troponin I 30 Sodium 136, potassium 3.9, chloride 98, bicarb 31, BUN 7, creatinine 0.68 eGFR >90  Collected: 06/04/2022. Total cholesterol 132, triglyceride 173, HDL 35, LDL 62 non-HDL 97  IMPRESSION:    ICD-10-CM   1. Precordial chest pain  R07.2     2. Palpitations  R00.2     3. PSVT (paroxysmal supraventricular tachycardia)  I47.10     4. Benign hypertension  I10     5. Type 2 diabetes mellitus with hyperglycemia, without long-term current use of insulin (HCC)  E11.65     6. Hypertriglyceridemia  E78.1        RECOMMENDATIONS: Lynn Moody is a 52 y.o. African-American female whose past medical history and cardiac risk factors include: Hypertension, non-insulin-dependent diabetes, hyperlipidemia, obesity due to excess calories.   Precordial pain: Resolved since last office visit.   Coronary calcium score 0.   GXT: Low risk  Educated on importance of primary prevention. No additional testing warranted at this time.  Palpitations Improving Zio patch results reviewed.  Underlying rhythm sinus with episodes of PSVT but overall PAC and PVC burden less than 1%.  No identifiable reversible causes. Hemoglobin and TSH within normal limits. EKG: Nonischemic. She wants to hold off on pharmacological therapy at this time.  Which is quite reasonable.  Benign hypertension Office blood pressure not well-controlled. Working with PCP with medication adjustments.  Type 2 diabetes mellitus with hyperglycemia, without long-term current use of insulin (HCC) A1c not well-controlled. Currently on ARB, rosuvastatin  Hypertriglyceridemia Not well-controlled as of July 2023. Reemphasized the importance of dietary restriction. Currently managed  by primary care provider.  Not actively managing any comorbid conditions.  Recommend follow-up as needed; however, she favors annual follow-up visits.  FINAL MEDICATION LIST END OF ENCOUNTER: No orders of the defined types were placed in this encounter.   Medications Discontinued During This Encounter  Medication Reason   Continuous Blood Gluc Transmit (DEXCOM G6 TRANSMITTER) MISC    Dulaglutide (TRULICITY) 1.5 MG/0.5ML SOPN       Current Outpatient Medications:    Continuous Blood Gluc Sensor (DEXCOM G6 SENSOR) MISC, SMARTSIG:1 Topical Every 10 Days, Disp: , Rfl:    diclofenac Sodium (VOLTAREN) 1 % GEL, Apply 2 g topically 4 (four) times daily., Disp: , Rfl:    ferrous  sulfate 324 MG TBEC, Take 324 mg by mouth., Disp: , Rfl:    ibuprofen (ADVIL) 800 MG tablet, Take 800 mg by mouth every 6 (six) hours as needed., Disp: , Rfl:    metFORMIN (GLUCOPHAGE-XR) 500 MG 24 hr tablet, Take 1,000 mg by mouth 2 (two) times daily., Disp: , Rfl:    olmesartan-hydrochlorothiazide (BENICAR HCT) 20-12.5 MG tablet, Take 1 tablet by mouth daily., Disp: , Rfl:    rosuvastatin (CRESTOR) 10 MG tablet, Take 10 mg by mouth daily., Disp: , Rfl:    traMADol (ULTRAM) 50 MG tablet, Take 50 mg by mouth every 6 (six) hours as needed for moderate pain or severe pain., Disp: , Rfl:    amLODipine-benazepril (LOTREL) 5-20 MG capsule, Take 1 capsule by mouth daily. (Patient not taking: Reported on 04/08/2023), Disp: , Rfl:   No orders of the defined types were placed in this encounter.   There are no Patient Instructions on file for this visit.   --Continue cardiac medications as reconciled in final medication list. --Return in about 1 year (around 04/07/2024) for Follow up, Palpitations. or sooner if needed. --Continue follow-up with your primary care physician regarding the management of your other chronic comorbid conditions.  Patient's questions and concerns were addressed to her satisfaction. She voices  understanding of the instructions provided during this encounter.   This note was created using a voice recognition software as a result there may be grammatical errors inadvertently enclosed that do not reflect the nature of this encounter. Every attempt is made to correct such errors.  Tessa Lerner, [place], [facility]  Pager:  [phone] Office: [phone]

## 2024-04-07 ENCOUNTER — Ambulatory Visit: Payer: Self-pay | Admitting: Cardiology

## 2024-04-23 ENCOUNTER — Encounter: Payer: Self-pay | Admitting: Cardiology

## 2024-04-23 ENCOUNTER — Ambulatory Visit: Attending: Cardiology | Admitting: Cardiology

## 2024-04-23 VITALS — BP 128/80 | HR 82 | Resp 16 | Ht 64.0 in | Wt 201.0 lb

## 2024-04-23 DIAGNOSIS — R002 Palpitations: Secondary | ICD-10-CM | POA: Insufficient documentation

## 2024-04-23 DIAGNOSIS — E781 Pure hyperglyceridemia: Secondary | ICD-10-CM | POA: Diagnosis present

## 2024-04-23 DIAGNOSIS — E1165 Type 2 diabetes mellitus with hyperglycemia: Secondary | ICD-10-CM | POA: Diagnosis not present

## 2024-04-23 DIAGNOSIS — I10 Essential (primary) hypertension: Secondary | ICD-10-CM | POA: Diagnosis not present

## 2024-04-23 DIAGNOSIS — I471 Supraventricular tachycardia, unspecified: Secondary | ICD-10-CM | POA: Insufficient documentation

## 2024-04-23 NOTE — Progress Notes (Signed)
 Cardiology Office Note:  .   Date:  04/23/2024  ID:  Lynn Moody, DOB 1971/09/06, MRN [id number] PCP:  Jacqulyne Maxim, MD  Former Cardiology Providers: None [facility] Providers Cardiologist:  Olinda Bertrand, DO , [facility] (established care 11/21/2022) Electrophysiologist:  None  Click to update primary MD,subspecialty MD or APP then REFRESH:1}    Chief Complaint  Patient presents with   Palpitations   Follow-up    History of Present Illness: .   Lynn Moody is a 53 y.o.  female whose past medical history and cardiovascular risk factors includes: Hypertension, non-insulin-dependent diabetes, hyperlipidemia, obesity due to excess calories.   In 2023 patient was referred to the practice for evaluation of palpitations. Her Zio patch at that time illustrated underlying rhythm to be sinus with episodes of PSVT but overall her ectopic burden was <1%. Due to continued symptoms we discussed low-dose AV nodal blocking agents but at that time patient was reluctant. We discussed the role of keeping herself well-hydrated, Valsalva maneuver for prolonged episodes, and also applying cold water if symptoms persist.  In addition, she also underwent ischemic workup given her symptoms of precordial pain in the past.  She presents today for 1 year follow-up visit.  Patient is accompanied by her husband at today's office visit.  Over the last 1 year she is doing well from a cardiovascular standpoint. She has remained asymptomatic with regards to her palpitations with the exception of 1 week of intermittent episodes of fluttering in the chest.  These episodes lasted for a few minutes and self resolved.  Likely secondary to anxiety or stress as she was at a conference.  When she came back home the symptoms did not recur.  She denies anginal chest pain or heart failure symptoms.  No near-syncope or syncopal events.   Review of Systems: .   Review of Systems  Cardiovascular:  Negative for  chest pain, claudication, irregular heartbeat, leg swelling, near-syncope, orthopnea, palpitations, paroxysmal nocturnal dyspnea and syncope.  Respiratory:  Negative for shortness of breath.   Hematologic/Lymphatic: Negative for bleeding problem.    Studies Reviewed:   EKG: EKG Interpretation Date/Time:  Thursday Apr 23 2024 08:54:39 EDT Ventricular Rate:  83 PR Interval:  162 QRS Duration:  76 QT Interval:  360 QTC Calculation: 423 R Axis:   83  Text Interpretation: Normal sinus rhythm Nonspecific T wave abnormality When compared with ECG of 04-Nov-2022 21:06, No significant change since last tracing Confirmed by Olinda Bertrand [phone]) on 04/23/2024 8:58:48 AM  Echocardiogram: 12/05/2022: Normal LV systolic function with visual EF 60-65%. Left ventricle cavity is normal in size. Normal left ventricular wall thickness. Normal global wall motion. Normal diastolic filling pattern, normal LAP.  Mild tricuspid regurgitation. No evidence of pulmonary hypertension. No prior study for comparison.     Stress Testing: Exercise treadmill stress test 01/07/2023: Exercise treadmill stress test performed using Bruce protocol. Patient reached 5.7 METS, and 91% of age predicted maximum heart rate. Exercise capacity was low. No chest pain reported. Normal heart rate and hemodynamic response.  Stress EKG showed sinus tachycardia, 1.5 mm upsloping ST depression in leads II, III, aVF, V5-V6 that normalize by 1 min into recovery. EKG changes are equivocal for ischemia. Low risk study. Of note, exercise capacity is poor. \   Heart Catheterization: [facility] Dr. Nydia Belfast. No epicardial coronary artery disease per report   Cardiac monitor (Zio Patch): December 05, 2022 -December 18, 2022  Dominant rhythm sinus, followed by tachycardia (burden 25%). Heart rate 59-190 bpm. Avg HR 94 bpm. No atrial fibrillation, ventricular tachycardia, high grade AV block,  pauses (3 seconds or longer). Total ventricular ectopic burden <1%. Total supraventricular ectopic burden <1%. Rare episodes of PSVT likely atrial tachycardia (Fastest episode 33 beats, 11 seconds, average HR 163 bpm, maximum HR 190 bpm). Patient triggered events: 2. These did not correlate with any significant dysrhythmias.    CT Cardiac Scoring: 02/12/2023 Total CAC 0 AU, percentile for patient's age, sex, and race. Noncardiac findings:Moderate hepatic steatosis.   RADIOLOGY: NA  Risk Assessment/Calculations:   NA   Labs:          Physical Exam:  Today's Vitals   04/23/24 0851  BP: 128/80  Pulse: 82  Resp: 16  SpO2: 98%  Weight: 201 lb (91.2 kg)  Height: 5\' 4"  (1.626 m)   Body mass index is 34.5 kg/m. Wt Readings from Last 3 Encounters:  04/23/24 201 lb (91.2 kg)  04/08/23 211 lb 3.2 oz (95.8 kg)  01/03/23 208 lb 3.2 oz (94.4 kg)    Physical Exam  Constitutional: No distress.  hemodynamically stable  Neck: No JVD present.  Cardiovascular: Normal rate, regular rhythm, S1 normal and S2 normal. Exam reveals no gallop, no S3 and no S4.  No murmur heard. Pulmonary/Chest: Effort normal and breath sounds normal. No stridor. She has no wheezes. She has no rales.  Musculoskeletal:        General: No edema.     Cervical back: Neck supple.  Skin: Skin is warm.    Impression:   ICD-10-CM   1. Palpitations  R00.2 EKG 12-Lead    2. PSVT (paroxysmal supraventricular tachycardia) (HCC)  I47.10     3. Benign hypertension  I10     4. Type 2 diabetes mellitus with hyperglycemia, without long-term current use of insulin (HCC)  E11.65     5. Hypertriglyceridemia  E78.1        Recommendation(s):  Palpitations PSVT (paroxysmal supraventricular tachycardia) (HCC) 1 year follow-up. Overall stable since last office visit. Had a week in the recent past when she did experience fluttering in the chest likely secondary to anxiety/stress she was at a conference.  The  symptoms resolved after she came back. Since her symptoms are overall stable no need for additional testing or pharmacological therapy at this time. Monitoring her symptoms and triggers.  Benign hypertension Office blood pressures are very well-controlled. Continue olmesartan/hydrochlorothiazide 20/12.5 mg p.o. daily. Reemphasized importance of low-salt diet.  Type 2 diabetes mellitus with hyperglycemia, without long-term current use of insulin (HCC) Reemphasized importance of glycemic control. Last hemoglobin A1c 9.1 as of February 2025. Currently on ARB and statin therapy as well  Hypertriglyceridemia Triglycerides have improved significantly when comparing to prior labs.  Discussed management of at least 2 chronic comorbid conditions, reviewed labs from February and May 2025 from Care Everywhere, prior echo and cardiac monitor results reviewed, plan of care discussed with both patient and husband during today's encounter.  Since she has remained stable from a cardiovascular standpoint over the last 1 year would recommend following up with cardiology on as-needed basis if need arises.  The patient is agreeable with the plan of care.  I will defer her back to her PCP for management of her other chronic comorbid conditions.  Orders Placed:  Orders Placed This Encounter  Procedures   EKG 12-Lead     Final Medication List:   No orders of the defined types  were placed in this encounter.   Medications Discontinued During This Encounter  Medication Reason   amLODipine-benazepril (LOTREL) 5-20 MG capsule Discontinued by provider     Current Outpatient Medications:    Continuous Blood Gluc Sensor (DEXCOM G6 SENSOR) MISC, SMARTSIG:1 Topical Every 10 Days, Disp: , Rfl:    diclofenac Sodium (VOLTAREN) 1 % GEL, Apply 2 g topically 4 (four) times daily., Disp: , Rfl:    ferrous sulfate 324 MG TBEC, Take 324 mg by mouth., Disp: , Rfl:    ibuprofen  (ADVIL ) 800 MG tablet, Take 800 mg by mouth  every 6 (six) hours as needed., Disp: , Rfl:    metFORMIN (GLUCOPHAGE-XR) 500 MG 24 hr tablet, Take 1,000 mg by mouth 2 (two) times daily., Disp: , Rfl:    olmesartan-hydrochlorothiazide (BENICAR HCT) 20-12.5 MG tablet, Take 1 tablet by mouth daily., Disp: , Rfl:    traMADol (ULTRAM) 50 MG tablet, Take 50 mg by mouth every 6 (six) hours as needed for moderate pain or severe pain., Disp: , Rfl:    rosuvastatin (CRESTOR) 10 MG tablet, Take 10 mg by mouth daily., Disp: , Rfl:   Consent:   NA  Disposition:   As needed  Her questions and concerns were addressed to her satisfaction. She voices understanding of the recommendations provided during this encounter.    Signed, Awilda Bogus, [facility]  A Division of [facility] [address], [place], [place] [address]  [place], [place] [address] 04/23/2024 10:36 AM

## 2024-04-23 NOTE — Patient Instructions (Signed)
 Medication Instructions:  No medication changes were made at this visit. Continue current regimen.   *If you need a refill on your cardiac medications before your next appointment, please call your pharmacy*  Lab Work: None ordered today. If you have labs (blood work) drawn today and your tests are completely normal, you will receive your results only by: MyChart Message (if you have MyChart) OR A paper copy in the mail If you have any lab test that is abnormal or we need to change your treatment, we will call you to review the results.  Testing/Procedures: None ordered today.  Follow-Up: At [facility], you and your health needs are our priority.  As part of our continuing mission to provide you with exceptional heart care, our providers are all part of one team.  This team includes your primary Cardiologist (physician) and Advanced Practice Providers or APPs (Physician Assistants and Nurse Practitioners) who all work together to provide you with the care you need, when you need it.  Your next appointment:   As needed  Provider:   Olinda Bertrand, DO
